# Patient Record
Sex: Female | Born: 1935 | Race: Black or African American | Hispanic: No | Smoking: Never smoker
Health system: Southern US, Community
[De-identification: ages and names within clinical notes are randomized; demographics above are authoritative.]

## PROBLEM LIST (undated history)

## (undated) DIAGNOSIS — G459 Transient cerebral ischemic attack, unspecified: Secondary | ICD-10-CM

## (undated) DIAGNOSIS — I82409 Acute embolism and thrombosis of unspecified deep veins of unspecified lower extremity: Secondary | ICD-10-CM

## (undated) DIAGNOSIS — M1990 Unspecified osteoarthritis, unspecified site: Secondary | ICD-10-CM

## (undated) DIAGNOSIS — I10 Essential (primary) hypertension: Secondary | ICD-10-CM

## (undated) DIAGNOSIS — N289 Disorder of kidney and ureter, unspecified: Secondary | ICD-10-CM

## (undated) DIAGNOSIS — I729 Aneurysm of unspecified site: Secondary | ICD-10-CM

## (undated) DIAGNOSIS — Z5189 Encounter for other specified aftercare: Secondary | ICD-10-CM

## (undated) HISTORY — PX: ABDOMINAL HYSTERECTOMY: SHX81

---

## 1997-09-30 ENCOUNTER — Other Ambulatory Visit: Admission: RE | Admit: 1997-09-30 | Discharge: 1997-09-30 | Payer: Self-pay | Admitting: Family Medicine

## 1998-10-09 ENCOUNTER — Emergency Department (HOSPITAL_COMMUNITY): Admission: EM | Admit: 1998-10-09 | Discharge: 1998-10-09 | Payer: Self-pay | Admitting: Emergency Medicine

## 1998-10-10 ENCOUNTER — Encounter: Payer: Self-pay | Admitting: Emergency Medicine

## 1998-12-24 ENCOUNTER — Other Ambulatory Visit: Admission: RE | Admit: 1998-12-24 | Discharge: 1998-12-24 | Payer: Self-pay | Admitting: Family Medicine

## 2000-12-01 ENCOUNTER — Encounter: Payer: Self-pay | Admitting: Family Medicine

## 2000-12-01 ENCOUNTER — Ambulatory Visit (HOSPITAL_COMMUNITY): Admission: RE | Admit: 2000-12-01 | Discharge: 2000-12-01 | Payer: Self-pay | Admitting: Family Medicine

## 2001-01-29 ENCOUNTER — Encounter: Payer: Self-pay | Admitting: Emergency Medicine

## 2001-01-29 ENCOUNTER — Emergency Department (HOSPITAL_COMMUNITY): Admission: EM | Admit: 2001-01-29 | Discharge: 2001-01-30 | Payer: Self-pay | Admitting: Emergency Medicine

## 2001-02-06 ENCOUNTER — Encounter: Payer: Self-pay | Admitting: Neurosurgery

## 2001-02-06 ENCOUNTER — Encounter: Admission: RE | Admit: 2001-02-06 | Discharge: 2001-02-06 | Payer: Self-pay | Admitting: Neurosurgery

## 2002-02-12 ENCOUNTER — Other Ambulatory Visit: Admission: RE | Admit: 2002-02-12 | Discharge: 2002-02-12 | Payer: Self-pay | Admitting: Family Medicine

## 2008-03-31 ENCOUNTER — Ambulatory Visit: Payer: Self-pay | Admitting: Surgery

## 2008-04-04 ENCOUNTER — Ambulatory Visit (HOSPITAL_COMMUNITY): Admission: RE | Admit: 2008-04-04 | Discharge: 2008-04-04 | Payer: Self-pay | Admitting: Surgery

## 2008-04-04 ENCOUNTER — Ambulatory Visit: Payer: Self-pay | Admitting: Surgery

## 2008-06-02 ENCOUNTER — Ambulatory Visit: Payer: Self-pay | Admitting: Surgery

## 2008-07-14 ENCOUNTER — Ambulatory Visit: Payer: Self-pay | Admitting: Surgery

## 2009-04-24 ENCOUNTER — Inpatient Hospital Stay (HOSPITAL_COMMUNITY): Admission: EM | Admit: 2009-04-24 | Discharge: 2009-04-29 | Payer: Self-pay | Admitting: Neurology

## 2009-04-27 ENCOUNTER — Ambulatory Visit: Payer: Self-pay | Admitting: Surgery

## 2009-04-27 ENCOUNTER — Encounter (INDEPENDENT_AMBULATORY_CARE_PROVIDER_SITE_OTHER): Payer: Self-pay | Admitting: Interventional Radiology

## 2009-07-13 ENCOUNTER — Ambulatory Visit: Payer: Self-pay | Admitting: Surgery

## 2009-10-17 ENCOUNTER — Inpatient Hospital Stay (HOSPITAL_COMMUNITY): Admission: EM | Admit: 2009-10-17 | Discharge: 2009-10-22 | Payer: Self-pay | Admitting: Emergency Medicine

## 2009-10-19 ENCOUNTER — Ambulatory Visit: Payer: Self-pay | Admitting: Internal Medicine

## 2009-10-21 ENCOUNTER — Encounter: Payer: Self-pay | Admitting: Internal Medicine

## 2009-10-21 ENCOUNTER — Encounter (INDEPENDENT_AMBULATORY_CARE_PROVIDER_SITE_OTHER): Payer: Self-pay | Admitting: Internal Medicine

## 2009-10-25 ENCOUNTER — Emergency Department (HOSPITAL_COMMUNITY): Admission: EM | Admit: 2009-10-25 | Discharge: 2009-10-25 | Payer: Self-pay | Admitting: Emergency Medicine

## 2009-11-10 ENCOUNTER — Ambulatory Visit: Payer: Self-pay | Admitting: Internal Medicine

## 2009-11-10 ENCOUNTER — Inpatient Hospital Stay (HOSPITAL_COMMUNITY): Admission: AD | Admit: 2009-11-10 | Discharge: 2009-11-16 | Payer: Self-pay | Admitting: Internal Medicine

## 2009-11-12 ENCOUNTER — Encounter: Payer: Self-pay | Admitting: Internal Medicine

## 2009-11-15 ENCOUNTER — Encounter: Payer: Self-pay | Admitting: Internal Medicine

## 2009-11-17 ENCOUNTER — Inpatient Hospital Stay (HOSPITAL_COMMUNITY): Admission: EM | Admit: 2009-11-17 | Discharge: 2009-11-20 | Payer: Self-pay | Admitting: Internal Medicine

## 2009-11-18 ENCOUNTER — Ambulatory Visit: Payer: Self-pay | Admitting: Vascular Surgery

## 2009-11-18 ENCOUNTER — Encounter (INDEPENDENT_AMBULATORY_CARE_PROVIDER_SITE_OTHER): Payer: Self-pay | Admitting: Family Medicine

## 2009-11-19 ENCOUNTER — Telehealth: Payer: Self-pay | Admitting: Internal Medicine

## 2009-11-23 ENCOUNTER — Encounter: Payer: Self-pay | Admitting: Internal Medicine

## 2009-11-23 DIAGNOSIS — M62838 Other muscle spasm: Secondary | ICD-10-CM | POA: Insufficient documentation

## 2010-01-12 ENCOUNTER — Telehealth: Payer: Self-pay | Admitting: Internal Medicine

## 2010-07-22 NOTE — Discharge Summary (Signed)
Summary: Hospital Discharge Update    Hospital Discharge Update:  Date of Admission: 11/10/2009 Date of Discharge: 11/12/2009  Brief Summary:  CP, HTN urgency, Abdominal pain, ESRD, refer to discharge summary for details.  Other follow-up issues:  HTN  Medication list changes:  Added new medication of CALCIUM ACETATE 667 MG CAPS (CALCIUM ACETATE (PHOS BINDER)) Take 1 tablet by mouth once a day - Signed Added new medication of AMLODIPINE BESYLATE 10 MG TABS (AMLODIPINE BESYLATE) Take 1 tablet by mouth once a day - Signed Added new medication of ASPIR-LOW 81 MG TBEC (ASPIRIN) Take 1 tablet by mouth once a day - Signed Added new medication of CARVEDILOL 6.25 MG TABS (CARVEDILOL) Take 1 tablet by mouth two times a day - Signed Added new medication of CILOSTAZOL 100 MG TABS (CILOSTAZOL) Take 1 tablet by mouth two times a day - Signed Added new medication of CLONIDINE HCL 0.3 MG TABS (CLONIDINE HCL) Take 1 tablet by mouth two times a day - Signed Added new medication of EPOGEN 10000 UNIT/ML SOLN (EPOETIN ALFA) - Signed Added new medication of LISINOPRIL 40 MG TABS (LISINOPRIL) Take 1 tablet by mouth once a day - Signed Added new medication of PROTONIX 40 MG PACK (PANTOPRAZOLE SODIUM) Take 1 tablet by mouth once a day - Signed Added new medication of RENAL MULTIVITAMIN FORMULA  TABS (B COMPLEX-C-FOLIC ACID) Take 1 tablet by mouth once a day - Signed Added new medication of SIMVASTATIN 40 MG TABS (SIMVASTATIN) Take 1 tablet by mouth once a day - Signed Changed medication from CARVEDILOL 6.25 MG TABS (CARVEDILOL) Take 1 tablet by mouth two times a day to COREG 25 MG TABS (CARVEDILOL) Take 1 tablet by mouth two times a day - Signed Rx of COREG 25 MG TABS (CARVEDILOL) Take 1 tablet by mouth two times a day;  #60 x 3;  Signed;  Entered by: Deatra Robinson MD;  Authorized by: Deatra Robinson MD;  Method used: Print then Give to Patient  The medication, problem, and allergy lists have been updated.   Please see the dictated discharge summary for details.  Discharge medications:  CALCIUM ACETATE 667 MG CAPS (CALCIUM ACETATE (PHOS BINDER)) Take 1 tablet by mouth once a day AMLODIPINE BESYLATE 10 MG TABS (AMLODIPINE BESYLATE) Take 1 tablet by mouth once a day ASPIR-LOW 81 MG TBEC (ASPIRIN) Take 1 tablet by mouth once a day COREG 25 MG TABS (CARVEDILOL) Take 1 tablet by mouth two times a day CILOSTAZOL 100 MG TABS (CILOSTAZOL) Take 1 tablet by mouth two times a day CLONIDINE HCL 0.3 MG TABS (CLONIDINE HCL) Take 1 tablet by mouth two times a day EPOGEN 10000 UNIT/ML SOLN (EPOETIN ALFA)  LISINOPRIL 40 MG TABS (LISINOPRIL) Take 1 tablet by mouth once a day PROTONIX 40 MG PACK (PANTOPRAZOLE SODIUM) Take 1 tablet by mouth once a day RENAL MULTIVITAMIN FORMULA  TABS (B COMPLEX-C-FOLIC ACID) Take 1 tablet by mouth once a day SIMVASTATIN 40 MG TABS (SIMVASTATIN) Take 1 tablet by mouth once a day  Other patient instructions:  Please come for an appointment at the outpatient clinic at [facility] on 6/7 at 10:40 am with Dr.Pokharel  for a followup visit.  Please take your medication as prescribed below.  If you have any problem, Please call the clinic.   In case of an emergency  dial 911 or go to the emergency department.    Note: Hospital Discharge Medications & Other Instructions handout was printed, one copy for patient and a second copy  to be placed in hospital chart.

## 2010-07-22 NOTE — Procedures (Signed)
Summary: Upper Endoscopy  Patient: [name] Note: All result statuses are Final unless otherwise noted.  Tests: (1) Upper Endoscopy (EGD)   EGD Upper Endoscopy       DONE     [facility]     [address]     [place], [place]  [address]           ENDOSCOPY PROCEDURE REPORT           PATIENT:  Judy Wolf, Judy Wolf  MR#:  [id number]     BIRTHDATE:  March 25, 1929, 80 yrs. old  GENDER:  female           ENDOSCOPIST:  Wilhemina Bonito. Eda Keys, MD     Referred by:  [facility],           PROCEDURE DATE:  10/21/2009     PROCEDURE:  EGD, diagnostic     ASA CLASS:  Class III     INDICATIONS:  melena (WAS HAVING EPISTAXIS ON COUMADIN)     NOTE: EMESIS LAST PM WITH PREP           MEDICATIONS:   There was residual sedation effect present from     prior procedure., Versed 1 mg IV     TOPICAL ANESTHETIC:  Cetacaine Spray           DESCRIPTION OF PROCEDURE:   After the risks benefits and     alternatives of the procedure were thoroughly explained, informed     consent was obtained.  The EG-[id number] ([id number]) endoscope was     introduced through the mouth and advanced to the second portion of     the duodenum, without limitations.  The instrument was slowly     withdrawn as the mucosa was fully examined.     <<PROCEDUREIMAGES>>           FRESH NONBLEEDING Mallory-Weiss tear in the distal esophagus.     Atrophic gastric mucosa.  Otherwise the examination was normal to     D3.    Retroflexed views revealed a hiatal hernia.    The scope     was then withdrawn from the patient and the procedure completed.           COMPLICATIONS:  None           ENDOSCOPIC IMPRESSION:     1) Nonbleeding Mallory-Weiss tear in the distal esophagus - from     emesis last pm     2) Atrophic gastric mucosa     3) Otherwise normal examination     4) A small hiatal hernia     5) ADMISSION MELENA LIKELY DUE TO EPISTAXIS           RECOMMENDATIONS:     1) Avoid COUMADIN for two weeks     2) PPI qam X 4  WEEKS     3) GI FOLLOW UP PRN     4) OK FOR HOME TOMORRROW IF STABLE           ______________________________     Wilhemina Bonito. Eda Keys, MD           CC:  [facility]           n.     eSIGNED:   Wilhemina Bonito. Eda Keys at 10/21/2009 12:22 PM           Mellody Life, [id number]  Note: An exclamation mark (!) indicates a result that was not dispersed into the flowsheet.  Document Creation Date: 10/21/2009 12:23 PM _______________________________________________________________________  (1) Order result status: Final Collection or observation date-time: 10/21/2009 12:13 Requested date-time:  Receipt date-time:  Reported date-time:  Referring Physician:   Ordering Physician: Fransico Setters [phone]) Specimen Source:  Source: Launa Grill Order Number: [id number] Lab site:

## 2010-07-22 NOTE — Progress Notes (Signed)
Summary: denial- Qualaquin/gp  Phone Note Other Incoming   Summary of Call: Received fax from [facility] , Janith Lima 324mg  has been denied. I will inform Dr.Magick. Initial call taken by: Chinita Pester RN,  January 12, 2010 4:19 PM  Follow-up for Phone Call        thank you Nitin Mckowen  I have called pt to find out if she still needs that medicine but have not heard from her. Thanks Delonda Coley Follow-up by: Mliss Sax MD,  January 12, 2010 4:27 PM

## 2010-07-22 NOTE — Progress Notes (Signed)
Summary: prior authorization form/gp  Phone Note From Pharmacy   Summary of Call: Prior Authorization form needs to be completed  and signed for Qualaquin 324 mg cap.  I will put the form in your box. Thanks Initial call taken by: Chinita Pester RN,  November 19, 2009 2:37 PM  Follow-up for Phone Call        i will do this today AM thanks  Denean Pavon Follow-up by: Mliss Sax MD,  November 23, 2009 7:12 AM     Appended Document: prior authorization form/gp Re-faxed  form to [facility] for PA approval  for Qualaquin; waiting on a response.  Appended Document: prior authorization form/gp Janith Lima has been denied; do you want to change medication? I will put denial letter in your in-box.

## 2010-07-22 NOTE — Letter (Signed)
Summary: HUMANA  HUMANA   Imported By: Margie Billet 11/25/2009 11:45:44  _____________________________________________________________________  External Attachment:    Type:   Image     Comment:   External Document

## 2010-07-22 NOTE — Progress Notes (Signed)
Summary: prior authorization/gp  Phone Note From Pharmacy   Caller: [facility] Summary of Call: Prior Authorization is needed for Qualaquin 324mg  cap - take 1 cap by mouth twice a day as needed for cramping. I do not see this med. on her current med. list. Is pt. on this medication also do you want to change to something else; if so, PA needs to be done. Just let me know; thanks. Initial call taken by: Chinita Pester RN,  November 19, 2009 12:21 PM  Follow-up for Phone Call        i just put in the med on the list. That would be fine to continue I just did not know the pharmacy that she is going to. Let me know if I need to do anything else thanks iskra Follow-up by: Mliss Sax MD,  November 19, 2009 1:39 PM    New/Updated Medications: QUALAQUIN 324 MG CAPS (QUININE SULFATE) take 1 tablet twice daily Prescriptions: QUALAQUIN 324 MG CAPS (QUININE SULFATE) take 1 tablet twice daily  #60 x 3   Entered and Authorized by:   Mliss Sax MD   Signed by:   Mliss Sax MD on 11/19/2009   Method used:   Historical   RxID:   [id number]

## 2010-07-22 NOTE — Procedures (Signed)
Summary: Colonoscopy  Patient: Judy Wolf Note: All result statuses are Final unless otherwise noted.  Tests: (1) Colonoscopy (COL)   COL Colonoscopy           DONE     [facility]     [address]     [place], [place]  [address]           COLONOSCOPY PROCEDURE REPORT           PATIENT:  Judy Wolf, Judy Wolf  MR#:  [id number]     BIRTHDATE:  April 01, 1929, 80 yrs. old  GENDER:  female     ENDOSCOPIST:  Wilhemina Bonito. Eda Keys, MD     REF. BY:  [facility],     PROCEDURE DATE:  10/21/2009     PROCEDURE:  Colonoscopy with snare polypectomy X 4     ASA CLASS:  Class III     INDICATIONS:  rectal bleeding, abnormal CT of abdomen, abdominal     pain     MEDICATIONS:   Fentanyl 100 mcg IV, Versed 6 mg IV           DESCRIPTION OF PROCEDURE:   After the risks benefits and     alternatives of the procedure were thoroughly explained, informed     consent was obtained.  Digital rectal exam was performed and     revealed no abnormalities.   The EC-[id number] ([id number]) endoscope was     introduced through the anus and advanced to the cecum, which was     identified by both the appendix and ileocecal valve, without     limitations.  The quality of the prep was good, using MiraLax.     The instrument was then slowly withdrawn as the colon was fully     examined.     <<PROCEDUREIMAGES>>           FINDINGS:  There were multiple polyps identified and removed.     AND CECAL AS WELL AS ASCENDING REMOVED W/ COLD SNARE AND     RETRIEVED. PEDUNCULATED SIGMOID COLON POLYP RFEMOVED IN 2     PIECES W/ SNARE CAUTERY. ONE PIECE RETRIEVED Moderate     diverticulosis was found in the sigmoid colon WITH SIGNIFICANT     STENOSIS.  This was otherwise a normal examination of the colon.     Retroflexed views in the rectum revealed no abnormalities.    The     scope was then withdrawn from the patient and the procedure     completed.           COMPLICATIONS:  None     ENDOSCOPIC  IMPRESSION:     1) Polyps, multiple (4) -REMOVED     2) Moderate diverticulosis in the sigmoid colon     3) Otherwise normal examination           RECOMMENDATIONS:     1) Return to the care of your primary provider. GI follow up as     needed (AGE / COMORBIDITIES)     2) EGD TODAY           ______________________________     Wilhemina Bonito. Eda Keys, MD           CC:  [facility]           n.     eSIGNED:   Wilhemina Bonito. Eda Keys at 10/21/2009 12:03 PM  Giada, [place], [phone]  Note: An exclamation mark (!) indicates a result that was not dispersed into the flowsheet. Document Creation Date: 10/21/2009 12:04 PM _______________________________________________________________________  (1) Order result status: Final Collection or observation date-time: 10/21/2009 11:57 Requested date-time:  Receipt date-time:  Reported date-time:  Referring Physician:   Ordering Physician: Fransico Setters [phone]) Specimen Source:  Source: Launa Grill Order Number: [id number] Lab site:

## 2010-09-06 LAB — HEPARIN LEVEL (UNFRACTIONATED)
Heparin Unfractionated: 0.4 IU/mL (ref 0.30–0.70)
Heparin Unfractionated: 0.45 IU/mL (ref 0.30–0.70)
Heparin Unfractionated: 0.64 IU/mL (ref 0.30–0.70)
Heparin Unfractionated: <0.1 IU/mL — ABNORMAL LOW (ref 0.30–0.70)
Heparin Unfractionated: <0.1 IU/mL — ABNORMAL LOW (ref 0.30–0.70)
Heparin Unfractionated: <0.1 IU/mL — ABNORMAL LOW (ref 0.30–0.70)

## 2010-09-06 LAB — HEPATITIS B SURFACE ANTIGEN: Hepatitis B Surface Ag: NEGATIVE

## 2010-09-06 LAB — CBC
HCT: 31.1 % — ABNORMAL LOW (ref 36.0–46.0)
HCT: 35.3 % — ABNORMAL LOW (ref 36.0–46.0)
HCT: 36.1 % (ref 36.0–46.0)
Hemoglobin: 10.8 g/dL — ABNORMAL LOW (ref 12.0–15.0)
Hemoglobin: 10.4 g/dL — ABNORMAL LOW (ref 12.0–15.0)
Hemoglobin: 11.5 g/dL — ABNORMAL LOW (ref 12.0–15.0)
Hemoglobin: 11.8 g/dL — ABNORMAL LOW (ref 12.0–15.0)
MCHC: 33.2 g/dL (ref 30.0–36.0)
MCHC: 33.4 g/dL (ref 30.0–36.0)
MCHC: 33.4 g/dL (ref 30.0–36.0)
MCHC: 33.4 g/dL (ref 30.0–36.0)
MCV: 98.6 fL (ref 78.0–100.0)
MCV: 98.2 fL (ref 78.0–100.0)
MCV: 98.8 fL (ref 78.0–100.0)
MCV: 99.1 fL (ref 78.0–100.0)
MCV: 99.3 fL (ref 78.0–100.0)
MCV: 99.3 fL (ref 78.0–100.0)
Platelets: 208 10*3/uL (ref 150–400)
Platelets: 242 10*3/uL (ref 150–400)
Platelets: 280 10*3/uL (ref 150–400)
Platelets: 334 10*3/uL (ref 150–400)
Platelets: 337 10*3/uL (ref 150–400)
Platelets: 349 10*3/uL (ref 150–400)
Platelets: 367 10*3/uL (ref 150–400)
Platelets: 385 10*3/uL (ref 150–400)
Platelets: 387 10*3/uL (ref 150–400)
RBC: 3.14 MIL/uL — ABNORMAL LOW (ref 3.87–5.11)
RBC: 3.93 MIL/uL (ref 3.87–5.11)
RDW: 16.6 % — ABNORMAL HIGH (ref 11.5–15.5)
RDW: 15.8 % — ABNORMAL HIGH (ref 11.5–15.5)
RDW: 16 % — ABNORMAL HIGH (ref 11.5–15.5)
RDW: 16.4 % — ABNORMAL HIGH (ref 11.5–15.5)
RDW: 16.4 % — ABNORMAL HIGH (ref 11.5–15.5)
RDW: 17 % — ABNORMAL HIGH (ref 11.5–15.5)
WBC: 5.2 10*3/uL (ref 4.0–10.5)
WBC: 6 10*3/uL (ref 4.0–10.5)
WBC: 7.2 10*3/uL (ref 4.0–10.5)
WBC: 7.6 10*3/uL (ref 4.0–10.5)
WBC: 8.8 10*3/uL (ref 4.0–10.5)

## 2010-09-06 LAB — RENAL FUNCTION PANEL
Albumin: 3 g/dL — ABNORMAL LOW (ref 3.5–5.2)
Albumin: 3.2 g/dL — ABNORMAL LOW (ref 3.5–5.2)
Albumin: 3.4 g/dL — ABNORMAL LOW (ref 3.5–5.2)
Albumin: 3.8 g/dL (ref 3.5–5.2)
BUN: 20 mg/dL (ref 6–23)
BUN: 26 mg/dL — ABNORMAL HIGH (ref 6–23)
BUN: 50 mg/dL — ABNORMAL HIGH (ref 6–23)
CO2: 30 mEq/L (ref 19–32)
CO2: 27 mEq/L (ref 19–32)
Calcium: 8.3 mg/dL — ABNORMAL LOW (ref 8.4–10.5)
Calcium: 9 mg/dL (ref 8.4–10.5)
Calcium: 9 mg/dL (ref 8.4–10.5)
Calcium: 9.4 mg/dL (ref 8.4–10.5)
Calcium: 9.8 mg/dL (ref 8.4–10.5)
Chloride: 97 mEq/L (ref 96–112)
Creatinine, Ser: 5.14 mg/dL — ABNORMAL HIGH (ref 0.4–1.2)
Creatinine, Ser: 6.71 mg/dL — ABNORMAL HIGH (ref 0.4–1.2)
Creatinine, Ser: 7.4 mg/dL — ABNORMAL HIGH (ref 0.4–1.2)
GFR calc Af Amer: 11 mL/min — ABNORMAL LOW (ref >60)
GFR calc Af Amer: 10 mL/min — ABNORMAL LOW (ref >60)
GFR calc Af Amer: 6 mL/min — ABNORMAL LOW (ref >60)
GFR calc Af Amer: 7 mL/min — ABNORMAL LOW (ref >60)
GFR calc Af Amer: 8 mL/min — ABNORMAL LOW (ref >60)
GFR calc non Af Amer: 9 mL/min — ABNORMAL LOW (ref >60)
GFR calc non Af Amer: 5 mL/min — ABNORMAL LOW (ref >60)
GFR calc non Af Amer: 7 mL/min — ABNORMAL LOW (ref >60)
GFR calc non Af Amer: 8 mL/min — ABNORMAL LOW (ref >60)
Glucose, Bld: 116 mg/dL — ABNORMAL HIGH (ref 70–99)
Phosphorus: 3.4 mg/dL (ref 2.3–4.6)
Phosphorus: 3 mg/dL (ref 2.3–4.6)
Phosphorus: 3.8 mg/dL (ref 2.3–4.6)
Phosphorus: 4.3 mg/dL (ref 2.3–4.6)
Phosphorus: 4.7 mg/dL — ABNORMAL HIGH (ref 2.3–4.6)
Phosphorus: 6 mg/dL — ABNORMAL HIGH (ref 2.3–4.6)
Sodium: 137 mEq/L (ref 135–145)
Sodium: 137 mEq/L (ref 135–145)
Sodium: 141 mEq/L (ref 135–145)

## 2010-09-06 LAB — CARDIAC PANEL(CRET KIN+CKTOT+MB+TROPI)
CK, MB: 1.5 ng/mL (ref 0.3–4.0)
CK, MB: 2.1 ng/mL (ref 0.3–4.0)
Relative Index: INVALID (ref 0.0–2.5)
Relative Index: INVALID (ref 0.0–2.5)
Relative Index: INVALID (ref 0.0–2.5)
Relative Index: INVALID (ref 0.0–2.5)
Total CK: 68 U/L (ref 7–177)
Total CK: 47 U/L (ref 7–177)
Total CK: 62 U/L (ref 7–177)
Troponin I: 0.04 ng/mL (ref 0.00–0.06)
Troponin I: 0.01 ng/mL (ref 0.00–0.06)
Troponin I: 0.04 ng/mL (ref 0.00–0.06)

## 2010-09-06 LAB — PROTIME-INR
INR: 1.26 (ref 0.00–1.49)
Prothrombin Time: 13.9 seconds (ref 11.6–15.2)
Prothrombin Time: 15.7 seconds — ABNORMAL HIGH (ref 11.6–15.2)

## 2010-09-06 LAB — BRAIN NATRIURETIC PEPTIDE: Pro B Natriuretic peptide (BNP): 296 pg/mL — ABNORMAL HIGH (ref 0.0–100.0)

## 2010-09-06 LAB — COMPREHENSIVE METABOLIC PANEL
ALT: 18 U/L (ref 0–35)
AST: 24 U/L (ref 0–37)
Albumin: 3.8 g/dL (ref 3.5–5.2)
BUN: 72 mg/dL — ABNORMAL HIGH (ref 6–23)
CO2: 21 mEq/L (ref 19–32)
Calcium: 8.3 mg/dL — ABNORMAL LOW (ref 8.4–10.5)
Chloride: 96 mEq/L (ref 96–112)
Creatinine, Ser: 10.1 mg/dL — ABNORMAL HIGH (ref 0.4–1.2)
Creatinine, Ser: 10.12 mg/dL — ABNORMAL HIGH (ref 0.4–1.2)
GFR calc Af Amer: 4 mL/min — ABNORMAL LOW (ref >60)
GFR calc non Af Amer: 4 mL/min — ABNORMAL LOW (ref >60)
Glucose, Bld: 113 mg/dL — ABNORMAL HIGH (ref 70–99)
Glucose, Bld: 166 mg/dL — ABNORMAL HIGH (ref 70–99)
Sodium: 132 mEq/L — ABNORMAL LOW (ref 135–145)
Total Bilirubin: 0.2 mg/dL — ABNORMAL LOW (ref 0.3–1.2)
Total Protein: 6.2 g/dL (ref 6.0–8.3)

## 2010-09-06 LAB — BASIC METABOLIC PANEL
CO2: 28 mEq/L (ref 19–32)
Potassium: 3.6 mEq/L (ref 3.5–5.1)
Sodium: 134 mEq/L — ABNORMAL LOW (ref 135–145)

## 2010-09-06 LAB — LIPID PANEL
Cholesterol: 148 mg/dL (ref 0–200)
LDL Cholesterol: 52 mg/dL (ref 0–99)
Total CHOL/HDL Ratio: 1.9 RATIO
Triglycerides: 65 mg/dL (ref <150)
Triglycerides: 39 mg/dL (ref <150)
VLDL: 8 mg/dL (ref 0–40)

## 2010-09-06 LAB — AMYLASE: Amylase: 115 U/L — ABNORMAL HIGH (ref 0–105)

## 2010-09-06 LAB — HEPATIC FUNCTION PANEL: Total Protein: 6.8 g/dL (ref 6.0–8.3)

## 2010-09-06 LAB — PHOSPHORUS: Phosphorus: 3.6 mg/dL (ref 2.3–4.6)

## 2010-09-07 LAB — POCT I-STAT, CHEM 8
BUN: 16 mg/dL (ref 6–23)
Calcium, Ion: 1.09 mmol/L — ABNORMAL LOW (ref 1.12–1.32)
Chloride: 94 meq/L — ABNORMAL LOW (ref 96–112)
Creatinine, Ser: 2.4 mg/dL — ABNORMAL HIGH (ref 0.4–1.2)
Glucose, Bld: 221 mg/dL — ABNORMAL HIGH (ref 70–99)
HCT: 32 % — ABNORMAL LOW (ref 36.0–46.0)
Hemoglobin: 10.9 g/dL — ABNORMAL LOW (ref 12.0–15.0)
Potassium: 3.1 meq/L — ABNORMAL LOW (ref 3.5–5.1)
Sodium: 138 meq/L (ref 135–145)
TCO2: 33 mmol/L (ref 0–100)

## 2010-09-07 LAB — COMPREHENSIVE METABOLIC PANEL
ALT: 20 U/L (ref 0–35)
AST: 24 U/L (ref 0–37)
AST: 24 U/L (ref 0–37)
Albumin: 3.3 g/dL — ABNORMAL LOW (ref 3.5–5.2)
Albumin: 3.3 g/dL — ABNORMAL LOW (ref 3.5–5.2)
Albumin: 3.6 g/dL (ref 3.5–5.2)
Alkaline Phosphatase: 48 U/L (ref 39–117)
BUN: 14 mg/dL (ref 6–23)
CO2: 35 mEq/L — ABNORMAL HIGH (ref 19–32)
Calcium: 8.7 mg/dL (ref 8.4–10.5)
Calcium: 8.4 mg/dL (ref 8.4–10.5)
Chloride: 95 mEq/L — ABNORMAL LOW (ref 96–112)
Chloride: 95 mEq/L — ABNORMAL LOW (ref 96–112)
Creatinine, Ser: 3.81 mg/dL — ABNORMAL HIGH (ref 0.4–1.2)
Creatinine, Ser: 6.16 mg/dL — ABNORMAL HIGH (ref 0.4–1.2)
Creatinine, Ser: 2.43 mg/dL — ABNORMAL HIGH (ref 0.4–1.2)
GFR calc Af Amer: 14 mL/min — ABNORMAL LOW (ref >60)
GFR calc Af Amer: 8 mL/min — ABNORMAL LOW (ref >60)
GFR calc non Af Amer: 11 mL/min — ABNORMAL LOW (ref >60)
GFR calc non Af Amer: 7 mL/min — ABNORMAL LOW (ref >60)
GFR calc non Af Amer: 19 mL/min — ABNORMAL LOW (ref >60)
Potassium: 3.6 mEq/L (ref 3.5–5.1)
Sodium: 139 mEq/L (ref 135–145)
Total Bilirubin: 0.9 mg/dL (ref 0.3–1.2)
Total Bilirubin: 0.5 mg/dL (ref 0.3–1.2)
Total Protein: 5.5 g/dL — ABNORMAL LOW (ref 6.0–8.3)

## 2010-09-07 LAB — POCT CARDIAC MARKERS
CKMB, poc: <1 ng/mL — ABNORMAL LOW (ref 1.0–8.0)
Myoglobin, poc: 487 ng/mL (ref 12–200)
Troponin i, poc: <0.05 ng/mL (ref 0.00–0.09)

## 2010-09-07 LAB — RENAL FUNCTION PANEL
Albumin: 3.2 g/dL — ABNORMAL LOW (ref 3.5–5.2)
BUN: 43 mg/dL — ABNORMAL HIGH (ref 6–23)
CO2: 25 mEq/L (ref 19–32)
CO2: 28 mEq/L (ref 19–32)
CO2: 32 mEq/L (ref 19–32)
Calcium: 9.3 mg/dL (ref 8.4–10.5)
Chloride: 94 mEq/L — ABNORMAL LOW (ref 96–112)
Chloride: 96 mEq/L (ref 96–112)
Creatinine, Ser: 5.03 mg/dL — ABNORMAL HIGH (ref 0.4–1.2)
Creatinine, Ser: 6.63 mg/dL — ABNORMAL HIGH (ref 0.4–1.2)
GFR calc Af Amer: 10 mL/min — ABNORMAL LOW (ref >60)
GFR calc Af Amer: 8 mL/min — ABNORMAL LOW (ref >60)
GFR calc non Af Amer: 6 mL/min — ABNORMAL LOW (ref >60)
GFR calc non Af Amer: 8 mL/min — ABNORMAL LOW (ref >60)
Glucose, Bld: 116 mg/dL — ABNORMAL HIGH (ref 70–99)
Glucose, Bld: 120 mg/dL — ABNORMAL HIGH (ref 70–99)
Glucose, Bld: 125 mg/dL — ABNORMAL HIGH (ref 70–99)
Phosphorus: 3.9 mg/dL (ref 2.3–4.6)
Potassium: 3.1 mEq/L — ABNORMAL LOW (ref 3.5–5.1)
Sodium: 133 mEq/L — ABNORMAL LOW (ref 135–145)

## 2010-09-07 LAB — CBC
HCT: 25.9 % — ABNORMAL LOW (ref 36.0–46.0)
HCT: 27.5 % — ABNORMAL LOW (ref 36.0–46.0)
HCT: 29.4 % — ABNORMAL LOW (ref 36.0–46.0)
Hemoglobin: 10.8 g/dL — ABNORMAL LOW (ref 12.0–15.0)
Hemoglobin: 8.8 g/dL — ABNORMAL LOW (ref 12.0–15.0)
MCHC: 33.4 g/dL (ref 30.0–36.0)
MCHC: 34.7 g/dL (ref 30.0–36.0)
MCHC: 34.1 g/dL (ref 30.0–36.0)
MCV: 94.7 fL (ref 78.0–100.0)
MCV: 95.2 fL (ref 78.0–100.0)
MCV: 95.6 fL (ref 78.0–100.0)
MCV: 95.9 fL (ref 78.0–100.0)
Platelets: 203 10*3/uL (ref 150–400)
Platelets: 215 10*3/uL (ref 150–400)
Platelets: 216 10*3/uL (ref 150–400)
Platelets: 208 10*3/uL (ref 150–400)
RBC: 2.71 MIL/uL — ABNORMAL LOW (ref 3.87–5.11)
RBC: 2.86 MIL/uL — ABNORMAL LOW (ref 3.87–5.11)
RBC: 3.4 MIL/uL — ABNORMAL LOW (ref 3.87–5.11)
RDW: 12.4 % (ref 11.5–15.5)
WBC: 10.4 10*3/uL (ref 4.0–10.5)
WBC: 5.9 10*3/uL (ref 4.0–10.5)
WBC: 6.4 10*3/uL (ref 4.0–10.5)
WBC: 6 10*3/uL (ref 4.0–10.5)

## 2010-09-07 LAB — DIFFERENTIAL
Basophils Absolute: 0 10*3/uL (ref 0.0–0.1)
Basophils Absolute: 0.1 10*3/uL (ref 0.0–0.1)
Eosinophils Absolute: 0.2 10*3/uL (ref 0.0–0.7)
Eosinophils Relative: 4 % (ref 0–5)
Lymphocytes Relative: 29 % (ref 12–46)
Lymphocytes Relative: 17 % (ref 12–46)
Monocytes Absolute: 0.6 10*3/uL (ref 0.1–1.0)
Neutro Abs: 4.4 10*3/uL (ref 1.7–7.7)

## 2010-09-07 LAB — PREPARE FRESH FROZEN PLASMA

## 2010-09-07 LAB — CARDIAC PANEL(CRET KIN+CKTOT+MB+TROPI)
CK, MB: 2.3 ng/mL (ref 0.3–4.0)
Relative Index: 1.8 (ref 0.0–2.5)
Relative Index: 1.9 (ref 0.0–2.5)
Relative Index: 1.8 (ref 0.0–2.5)
Total CK: 125 U/L (ref 7–177)
Total CK: 121 U/L (ref 7–177)
Troponin I: 0.03 ng/mL (ref 0.00–0.06)
Troponin I: 0.03 ng/mL (ref 0.00–0.06)
Troponin I: 0.03 ng/mL (ref 0.00–0.06)

## 2010-09-07 LAB — TYPE AND SCREEN: ABO/RH(D): B POS

## 2010-09-07 LAB — HEMOGLOBIN A1C: Hgb A1c MFr Bld: 6.6 % — ABNORMAL HIGH (ref <5.7)

## 2010-09-07 LAB — GLUCOSE, CAPILLARY: Glucose-Capillary: 167 mg/dL — ABNORMAL HIGH (ref 70–99)

## 2010-09-07 LAB — PROTIME-INR: Prothrombin Time: 38.1 seconds — ABNORMAL HIGH (ref 11.6–15.2)

## 2010-09-07 LAB — ABO/RH: ABO/RH(D): B POS

## 2010-09-07 LAB — APTT: aPTT: 46 seconds — ABNORMAL HIGH (ref 24–37)

## 2010-09-07 LAB — HEMOGLOBIN AND HEMATOCRIT, BLOOD
HCT: 27.6 % — ABNORMAL LOW (ref 36.0–46.0)
Hemoglobin: 9.4 g/dL — ABNORMAL LOW (ref 12.0–15.0)

## 2010-09-22 LAB — GLUCOSE, CAPILLARY
Glucose-Capillary: 100 mg/dL — ABNORMAL HIGH (ref 70–99)
Glucose-Capillary: 103 mg/dL — ABNORMAL HIGH (ref 70–99)
Glucose-Capillary: 106 mg/dL — ABNORMAL HIGH (ref 70–99)
Glucose-Capillary: 107 mg/dL — ABNORMAL HIGH (ref 70–99)
Glucose-Capillary: 120 mg/dL — ABNORMAL HIGH (ref 70–99)
Glucose-Capillary: 125 mg/dL — ABNORMAL HIGH (ref 70–99)
Glucose-Capillary: 131 mg/dL — ABNORMAL HIGH (ref 70–99)
Glucose-Capillary: 142 mg/dL — ABNORMAL HIGH (ref 70–99)
Glucose-Capillary: 173 mg/dL — ABNORMAL HIGH (ref 70–99)

## 2010-09-22 LAB — CBC
HCT: 39 % (ref 36.0–46.0)
HCT: 39.8 % (ref 36.0–46.0)
HCT: 41.3 % (ref 36.0–46.0)
HCT: 42.3 % (ref 36.0–46.0)
Hemoglobin: 13.1 g/dL (ref 12.0–15.0)
Hemoglobin: 13.3 g/dL (ref 12.0–15.0)
Hemoglobin: 13.6 g/dL (ref 12.0–15.0)
Hemoglobin: 14 g/dL (ref 12.0–15.0)
MCHC: 33.3 g/dL (ref 30.0–36.0)
MCHC: 33.4 g/dL (ref 30.0–36.0)
MCV: 92.7 fL (ref 78.0–100.0)
MCV: 93.8 fL (ref 78.0–100.0)
Platelets: 200 10*3/uL (ref 150–400)
Platelets: 227 10*3/uL (ref 150–400)
RBC: 4.52 MIL/uL (ref 3.87–5.11)
RDW: 14.7 % (ref 11.5–15.5)
RDW: 14.8 % (ref 11.5–15.5)
RDW: 14.9 % (ref 11.5–15.5)
RDW: 15 % (ref 11.5–15.5)
RDW: 15.1 % (ref 11.5–15.5)

## 2010-09-22 LAB — PROTIME-INR
INR: 1.29 (ref 0.00–1.49)
INR: 1.33 (ref 0.00–1.49)
INR: 1.53 — ABNORMAL HIGH (ref 0.00–1.49)
INR: 1.54 — ABNORMAL HIGH (ref 0.00–1.49)
INR: 1.82 — ABNORMAL HIGH (ref 0.00–1.49)
Prothrombin Time: 16 seconds — ABNORMAL HIGH (ref 11.6–15.2)

## 2010-09-22 LAB — RENAL FUNCTION PANEL
BUN: 39 mg/dL — ABNORMAL HIGH (ref 6–23)
CO2: 25 mEq/L (ref 19–32)
Calcium: 9.4 mg/dL (ref 8.4–10.5)
Creatinine, Ser: 5.85 mg/dL — ABNORMAL HIGH (ref 0.4–1.2)
Glucose, Bld: 103 mg/dL — ABNORMAL HIGH (ref 70–99)
Phosphorus: 2.4 mg/dL (ref 2.3–4.6)
Sodium: 136 mEq/L (ref 135–145)

## 2010-09-22 LAB — BASIC METABOLIC PANEL
BUN: 29 mg/dL — ABNORMAL HIGH (ref 6–23)
CO2: 30 mEq/L (ref 19–32)
Chloride: 93 mEq/L — ABNORMAL LOW (ref 96–112)
Creatinine, Ser: 4.76 mg/dL — ABNORMAL HIGH (ref 0.4–1.2)
GFR calc Af Amer: 11 mL/min — ABNORMAL LOW (ref >60)

## 2010-09-22 LAB — LIPID PANEL
Cholesterol: 155 mg/dL (ref 0–200)
HDL: 91 mg/dL (ref >39)
LDL Cholesterol: 51 mg/dL (ref 0–99)
Total CHOL/HDL Ratio: 1.7 RATIO
Triglycerides: 67 mg/dL (ref <150)

## 2010-09-22 LAB — HEPARIN LEVEL (UNFRACTIONATED): Heparin Unfractionated: 0.57 IU/mL (ref 0.30–0.70)

## 2010-09-22 LAB — HEMOGLOBIN A1C: Mean Plasma Glucose: 137 mg/dL

## 2010-11-02 NOTE — Assessment & Plan Note (Signed)
OFFICE VISIT   BROOKLEN, RUNQUIST  DOB:  05/15/1929                                       03/31/2008  EAVWU#:[id number]   REASON FOR VISIT:  Dialysis access.   HISTORY:  This is a 77 year old female that I am seeing at the request  of Dr. Caryn Section for evaluation of permanent dialysis access.  The patient has  a history of longstanding and poorly controlled hypertension as well as  hypercholesterolemia.  She is getting to the point of requiring  dialysis.  She is not yet on dialysis but comes in for permanent access.  She is right-handed.   PAST MEDICAL HISTORY:  Hypertension, proteinuria, chronic kidney  disease, hyperlipidemia and tobacco abuse.   REVIEW OF SYSTEMS:  GENERAL:  Positive for weight loss and loss of  appetite.  CARDIAC:  Negative.  PULMONARY:  Negative.  GI:  Positive for constipation.  GU:  Positive for kidney disease.  VASCULAR:  Pain in legs when walking and lying flat.  NEURO:  Is positive for dizziness and headaches.  ORTHO:  Positive for arthritis and joint pains.  PSYCHIATRIC:  Negative.  ENT:  Is recent change in eyesight and hearing.  HEME:  Is negative.   FAMILY HISTORY:  Noncontributory.   SOCIAL HISTORY:  She is widowed.  Does not smoke, quit approximately 1  month ago.  Does not drink alcohol.   MEDICATIONS:  1. Include clonidine 0.1 mg.  2. Doxazosin.  3. Simvastatin.  4. Nisoldipine.  5. Potassium.  6. Lasix.  7. Cardura.  8. Norvasc.  9. Labetalol.   ALLERGIES:  None.   PHYSICAL EXAMINATION:  Vital signs:  Blood pressure is 210/75, pulse 71.  General:  She is well-appearing and in no distress.  Cardiovascular:  Regular rate and rhythm.  Pulmonary:  Lungs are clear bilaterally.  Extremities:  She has palpable left radial pulse, visible cephalic vein.   DIAGNOSTIC STUDIES:  Upper extremity vein mapping was performed.  This  reveals cephalic vein measuring approximately 0.3 throughout the entire  length with the  exception of the antecubital crease where it goes down  to 0.2.   ASSESSMENT:  Chronic kidney disease needing permanent access.   PLAN:  The patient will be scheduled for a left wrist fistula to be  performed Friday, October 16.  I discussed that if the vein was not  adequate at her wrist that we would move to her antecubital crease.  I  quoted there was an approximately 85% chance of maturation.  We  discussed the risk of steal and infection.  She understands all these  and elected to proceed.  Again, her operation is scheduled for Friday,  October 16, left wrist fistula.   Jorge Ny, MD  Electronically Signed   VWB/MEDQ  D:  03/31/2008  T:  04/01/2008  Job:  [id number]

## 2010-11-02 NOTE — Assessment & Plan Note (Signed)
OFFICE VISIT   Judy, Wolf  DOB:  [birth year]                                       07/14/2008  VFIEP#:[id number]   HISTORY:  This is a 77 year old female not yet on dialysis who underwent  left wrist Camino fistula on April 04, 2008.  During her postoperative  course she had mild complaints of pain in her left middle finger as well  as her left thumb.  She began using exercise ball and she states today  that the pain has resolved.  She has no complaints with her arm at this  time.  She denies any swelling.   PHYSICAL EXAMINATION:  VITAL SIGNS:  Blood pressure 180/65, pulse 66,  respirations 18.  GENERAL:  She is well-appearing, no distress.  CARDIOVASCULAR:  Regular rate and rhythm.  The fistula is widely patent.  There is an excellent thrill.   ASSESSMENT/PLAN:  Status post Cimino fistula.   PLAN:  I think this will serve as a nice conduit for her to receive  dialysis should the need occur.  I will follow up with her on a p.r.n.  basis.   Jorge Ny, MD  Electronically Signed   VWB/MEDQ  D:  07/14/2008  T:  07/15/2008  Job:  [id number]   cc:   [facility]

## 2010-11-02 NOTE — Assessment & Plan Note (Signed)
OFFICE VISIT   NANAMI, WHITELAW  DOB:  Feb 06, 1929                                       06/02/2008  OZHYQ#:[id number]   REASON FOR VISIT:  Followup.   HISTORY:  This is a 77 year old female not yet on dialysis who underwent  a left wrist Cimino fistula performed on 04/04/2008.  She comes in today  for followup.  She is complaining of some pain in her left middle finger  as well as her left thumb.  It began happening approximately 2 weeks  ago.  She has an exercise ball, but has not been routinely using it.   EXAMINATION:  Her blood pressure is 220/71, pulse 77, respirations 18.  The left cephalic vein is readily palpable with a palpable thrill in the  forearm.   PLAN:  I am going to have the patient come back to see me in 1 month's  time to further evaluation the fistula.  I do think it will provide an  excellent access site if she ever requires dialysis.  With regard to the  pain in her hand, I am giving her a new exercise ball and encouraging  her to use it.  It is unclear to me at this time whether or not her pain  is due to steal, since it is limited to 2 fingers.  In any event, I  think if it is a steal syndrome, it is very mild at best and is  something that should get better with exercise.  Again, I am going to  see her back in 1 month.   Jorge Ny, MD  Electronically Signed   VWB/MEDQ  D:  06/02/2008  T:  06/03/2008  Job:  [id number]   cc:   [facility]

## 2010-11-02 NOTE — Procedures (Signed)
CEPHALIC VEIN MAPPING   INDICATION:  Preop exam for AV fistula placement.   HISTORY:  Chronic kidney disease.   EXAM:  The right cephalic vein  was not evaluated.   The left cephalic vein is compressible.   Diameter measurements range from 0.2 to 0.35 cm.   IMPRESSION:  Patent left cephalic vein with diameter measurements as  described above.   ___________________________________________  V. Charlena Cross, MD   CH/MEDQ  D:  03/31/2008  T:  03/31/2008  Job:  [id number]

## 2010-11-02 NOTE — Op Note (Signed)
NAMEDEANA, Judy Wolf                  ACCOUNT NO.:  [id number]   MEDICAL RECORD NO.:  [id number]          PATIENT TYPE:  AMB   LOCATION:  SDS                          FACILITY:  [facility]   PHYSICIAN:  VDurene Cal IV, MDDATE OF BIRTH:  Jul 09, 1928   DATE OF PROCEDURE:  04/04/2008  DATE OF DISCHARGE:                               OPERATIVE REPORT   PREOPERATIVE DIAGNOSIS:  Chronic kidney disease.   POSTOPERATIVE DIAGNOSIS:  Chronic kidney disease.   SURGEON:  1. Durene Cal IV, MD   PROCEDURE PERFORMED:  Left wrist fistula.   ANESTHESIA:  MAC.   BLOOD LOSS:  Minimal.   FINDINGS:  4-mm vein.   PROCEDURE:  The patient was identified in the holding and taken to room  8 and she was placed supine on the table.  The left wrist was prepped  and draped in standard sterile fashion.  Time-out was called.  Antibiotics were given.  A transverse incision was made two  fingerbreadths below the radial head.  The cephalic vein was identified  within the lateral part of the incision that was free from the  surrounding tissue and encircled with a vessel loop.  The vein was then  dissected further proximal.  It was marked with an ink pen to maintain  proper orientation.  Next, the radial artery was identified and  mobilized proximally and distally.  The patient was given systemic  heparinization.  The radial artery was occluded with vascular clamps.  Prior to this, the vein had been transected, the end was ligated with a  3-0 silk tie.  The vein flushed easily with heparinized saline.  It  accepted a 4-mm coronary dilator.  Vein was then cut to the appropriate  length.  It was spatulated and end-to-side anastomosis was created using  a running 6-0 Prolene to the radial artery.  Prior to completion of the  anastomosis, the artery was flushed in antegrade and retrograde fashion.  An anastomosis was then secured, proximal clamp was released followed by  the distal clamp.  There was an excellent  thrill within the vein to the  level of the elbow.  I was very happy with the position of vein and its  size.  The wound was irrigated.  Tissue was closed with 3-0 and 4-0  Vicryl.  Dermabond was placed.  The patient was taken to the recovery  room in stable condition.  There were no complications.           ______________________________  V. Charlena Cross, MD  Electronically Signed     VWB/MEDQ  D:  04/04/2008  T:  04/04/2008  Job:  [id number]

## 2010-11-02 NOTE — Assessment & Plan Note (Signed)
OFFICE VISIT   TRINIDAD, INGLE  DOB:  [birth year]                                       07/13/2009  EAVWU#:[id number]   REASON FOR VISIT:  Right leg pain.   HISTORY:  The patient is a 77 year old female previously seen in our  office for dialysis access.  The patient currently dialyzes through a  left radiocephalic fistula.  She comes in today for evaluation of a new  problem which is pain in her right leg.  She states that she has had  problems with this and her right leg worse than her left for  approximately 1 year, but it has been getting worse.  She describes this  as if something is crawling on her leg.  She states that this mostly  occurs at night.  It does not occur with exercise.  She does complain of  some toe pain in her right great toe at the end of the day.  She has a  history of restless leg syndrome as well as a history of tingling in her  toes.   The patient again is end-stage renal disease.  She dialyzes Tuesday,  Thursday, Saturday through a left radiocephalic fistula.  Her renal  disease is secondary to hypertension which is medically treated.  She  also suffers from hypercholesterolemia.   REVIEW OF SYSTEMS:  Negative as documented in the encounter form except  for what is detailed in the HPI.   PAST MEDICAL HISTORY:  Hypertension, hypercholesterolemia, and end-stage  renal disease.   FAMILY HISTORY:  Negative for cardiovascular disease at an early age.   SOCIAL HISTORY:  She is widowed with 5 children, smokes a half-pack a  day.   ALLERGIES:  None.   Vital signs:  Heart 61, blood pressure 195/71, temperature is 98.0.  General:  She is well appearing, in no distress.  HEENT is within normal  limits.  Lungs are clear bilaterally.  No rales, rhonchi, or wheezing.  Cardiovascular:  Regular rhythm.  No murmur.  She does not have palpable  pedal pulses.  She has palpable femoral pulses.  Abdomen:  Soft,  nontender.  No pulsatile mass.   No hepatosplenomegaly.  Musculoskeletal:  Without major deformities or cyanosis.  Neurologic:  She has no focal  deficits.  Skin is without rash.   DIAGNOSTIC STUDIES:  Ankle brachial indices were performed today which  been independently reviewed.  This reveals an ABI of 0.98 on the left  with a toe pressure of 136 on the right.  Her ABI is 0.53 with a toe  pressure of 50.  She has occlusive digital waveforms.   PLAN:  At this time, I think based on the patient's symptoms that her  difficulties are multifactorial.  At this point, I am going to elect to  treat her medically which will begin with a trial of cilostazol to see  if this has some improvement in her symptoms.  I am going to give her 3  months to see this will improve her quality of life.  If it does not, I  will need to consider whether or not to proceed with arteriogram.  At  this point, I am unsure if revascularization will help her symptoms.  We  will revisit this in 3 months when she comes back.     Juleen China  IV, MD  Electronically Signed   VWB/MEDQ  D:  07/13/2009  T:  07/14/2009  Job:  [id number]   cc:   Garnetta Buddy, M.D.

## 2011-03-21 LAB — POCT I-STAT 4, (NA,K, GLUC, HGB,HCT)
Hemoglobin: 11.6 — ABNORMAL LOW
Sodium: 135

## 2011-07-29 ENCOUNTER — Inpatient Hospital Stay (HOSPITAL_COMMUNITY)
Admission: EM | Admit: 2011-07-29 | Discharge: 2011-08-05 | DRG: 417 | Disposition: A | Discharge status: Home Health | Payer: Medicare Other | Attending: Internal Medicine | Admitting: Internal Medicine

## 2011-07-29 ENCOUNTER — Emergency Department (HOSPITAL_COMMUNITY): Payer: Medicare Other

## 2011-07-29 ENCOUNTER — Other Ambulatory Visit: Payer: Self-pay

## 2011-07-29 ENCOUNTER — Encounter (HOSPITAL_COMMUNITY): Payer: Self-pay | Admitting: Emergency Medicine

## 2011-07-29 DIAGNOSIS — N2581 Secondary hyperparathyroidism of renal origin: Secondary | ICD-10-CM | POA: Diagnosis present

## 2011-07-29 DIAGNOSIS — R109 Unspecified abdominal pain: Secondary | ICD-10-CM | POA: Diagnosis present

## 2011-07-29 DIAGNOSIS — K8010 Calculus of gallbladder with chronic cholecystitis without obstruction: Principal | ICD-10-CM | POA: Diagnosis present

## 2011-07-29 DIAGNOSIS — D649 Anemia, unspecified: Secondary | ICD-10-CM | POA: Diagnosis not present

## 2011-07-29 DIAGNOSIS — Z7901 Long term (current) use of anticoagulants: Secondary | ICD-10-CM

## 2011-07-29 DIAGNOSIS — B370 Candidal stomatitis: Secondary | ICD-10-CM | POA: Diagnosis not present

## 2011-07-29 DIAGNOSIS — I120 Hypertensive chronic kidney disease with stage 5 chronic kidney disease or end stage renal disease: Secondary | ICD-10-CM | POA: Diagnosis present

## 2011-07-29 DIAGNOSIS — Z79899 Other long term (current) drug therapy: Secondary | ICD-10-CM

## 2011-07-29 DIAGNOSIS — N186 End stage renal disease: Secondary | ICD-10-CM | POA: Diagnosis present

## 2011-07-29 DIAGNOSIS — Z8673 Personal history of transient ischemic attack (TIA), and cerebral infarction without residual deficits: Secondary | ICD-10-CM

## 2011-07-29 DIAGNOSIS — I739 Peripheral vascular disease, unspecified: Secondary | ICD-10-CM | POA: Diagnosis present

## 2011-07-29 DIAGNOSIS — K8020 Calculus of gallbladder without cholecystitis without obstruction: Secondary | ICD-10-CM

## 2011-07-29 DIAGNOSIS — I10 Essential (primary) hypertension: Secondary | ICD-10-CM | POA: Diagnosis present

## 2011-07-29 DIAGNOSIS — R112 Nausea with vomiting, unspecified: Secondary | ICD-10-CM | POA: Diagnosis present

## 2011-07-29 DIAGNOSIS — E119 Type 2 diabetes mellitus without complications: Secondary | ICD-10-CM | POA: Diagnosis present

## 2011-07-29 DIAGNOSIS — Z86711 Personal history of pulmonary embolism: Secondary | ICD-10-CM

## 2011-07-29 DIAGNOSIS — Z992 Dependence on renal dialysis: Secondary | ICD-10-CM | POA: Diagnosis present

## 2011-07-29 DIAGNOSIS — G459 Transient cerebral ischemic attack, unspecified: Secondary | ICD-10-CM | POA: Diagnosis present

## 2011-07-29 DIAGNOSIS — M62838 Other muscle spasm: Secondary | ICD-10-CM

## 2011-07-29 DIAGNOSIS — R1011 Right upper quadrant pain: Secondary | ICD-10-CM

## 2011-07-29 HISTORY — DX: Transient cerebral ischemic attack, unspecified: G45.9

## 2011-07-29 HISTORY — DX: Disorder of kidney and ureter, unspecified: N28.9

## 2011-07-29 HISTORY — DX: Essential (primary) hypertension: I10

## 2011-07-29 LAB — URINALYSIS, ROUTINE W REFLEX MICROSCOPIC
Glucose, UA: NEGATIVE mg/dL
Ketones, ur: NEGATIVE mg/dL
Leukocytes, UA: NEGATIVE
Protein, ur: 100 mg/dL — AB
Urobilinogen, UA: 1 mg/dL (ref 0.0–1.0)

## 2011-07-29 LAB — LIPASE, BLOOD: Lipase: 21 U/L (ref 11–59)

## 2011-07-29 LAB — COMPREHENSIVE METABOLIC PANEL
BUN: 19 mg/dL (ref 6–23)
CO2: 34 mEq/L — ABNORMAL HIGH (ref 19–32)
Chloride: 94 mEq/L — ABNORMAL LOW (ref 96–112)
Creatinine, Ser: 3.87 mg/dL — ABNORMAL HIGH (ref 0.50–1.10)
GFR calc Af Amer: 12 mL/min — ABNORMAL LOW (ref >90)
GFR calc non Af Amer: 10 mL/min — ABNORMAL LOW (ref >90)
Glucose, Bld: 116 mg/dL — ABNORMAL HIGH (ref 70–99)
Total Bilirubin: 0.4 mg/dL (ref 0.3–1.2)

## 2011-07-29 LAB — DIFFERENTIAL
Eosinophils Relative: 0 % (ref 0–5)
Lymphocytes Relative: 9 % — ABNORMAL LOW (ref 12–46)
Monocytes Absolute: 0.6 10*3/uL (ref 0.1–1.0)
Monocytes Relative: 8 % (ref 3–12)
Neutro Abs: 5.6 10*3/uL (ref 1.7–7.7)

## 2011-07-29 LAB — CBC
HCT: 31.5 % — ABNORMAL LOW (ref 36.0–46.0)
Hemoglobin: 10.4 g/dL — ABNORMAL LOW (ref 12.0–15.0)
MCV: 91.8 fL (ref 78.0–100.0)
WBC: 6.8 10*3/uL (ref 4.0–10.5)

## 2011-07-29 MED ORDER — HYDROMORPHONE HCL PF 1 MG/ML IJ SOLN
0.5000 mg | INTRAMUSCULAR | Status: DC | PRN
  Administered 2011-07-29 – 2011-08-03 (×6): 1 mg via INTRAVENOUS
  Filled 2011-07-29 (×5): qty 1

## 2011-07-29 MED ORDER — CLONIDINE HCL 0.1 MG PO TABS
0.1000 mg | ORAL_TABLET | Freq: Three times a day (TID) | ORAL | Status: DC
  Administered 2011-07-29 – 2011-08-05 (×18): 0.1 mg via ORAL
  Filled 2011-07-29 (×24): qty 1

## 2011-07-29 MED ORDER — AMLODIPINE BESYLATE 10 MG PO TABS
10.0000 mg | ORAL_TABLET | Freq: Two times a day (BID) | ORAL | Status: DC
  Administered 2011-07-29 – 2011-08-05 (×14): 10 mg via ORAL
  Filled 2011-07-29 (×15): qty 1

## 2011-07-29 MED ORDER — WHITE PETROLATUM GEL
Status: AC
  Administered 2011-07-29: 21:00:00
  Filled 2011-07-29: qty 5

## 2011-07-29 MED ORDER — ONDANSETRON HCL 4 MG/2ML IJ SOLN
4.0000 mg | Freq: Once | INTRAMUSCULAR | Status: AC
  Administered 2011-07-29: 4 mg via INTRAVENOUS
  Filled 2011-07-29: qty 2

## 2011-07-29 MED ORDER — HYDROMORPHONE HCL PF 1 MG/ML IJ SOLN: 0.5000 mg | INTRAMUSCULAR | Status: DC | PRN

## 2011-07-29 MED ORDER — SODIUM CHLORIDE 0.9 % IV BOLUS (SEPSIS)
500.0000 mL | Freq: Once | INTRAVENOUS | Status: AC
  Administered 2011-07-29: 500 mL via INTRAVENOUS

## 2011-07-29 MED ORDER — ONDANSETRON HCL 4 MG PO TABS: 4.0000 mg | ORAL_TABLET | Freq: Four times a day (QID) | ORAL | Status: DC | PRN

## 2011-07-29 MED ORDER — ONDANSETRON HCL 4 MG/2ML IJ SOLN: 4.0000 mg | Freq: Three times a day (TID) | INTRAMUSCULAR | Status: DC | PRN

## 2011-07-29 MED ORDER — ACETAMINOPHEN 650 MG RE SUPP: 650.0000 mg | Freq: Four times a day (QID) | RECTAL | Status: DC | PRN

## 2011-07-29 MED ORDER — HYDROCODONE-ACETAMINOPHEN 5-500 MG PO TABS: 1.0000 | ORAL_TABLET | Freq: Four times a day (QID) | ORAL | Status: DC | PRN

## 2011-07-29 MED ORDER — FENTANYL CITRATE 0.05 MG/ML IJ SOLN
50.0000 ug | Freq: Once | INTRAMUSCULAR | Status: AC
  Administered 2011-07-29: 50 ug via INTRAVENOUS
  Filled 2011-07-29: qty 2

## 2011-07-29 MED ORDER — ONDANSETRON HCL 4 MG/2ML IJ SOLN
4.0000 mg | Freq: Four times a day (QID) | INTRAMUSCULAR | Status: DC | PRN
  Administered 2011-07-29 – 2011-08-01 (×3): 4 mg via INTRAVENOUS
  Filled 2011-07-29 (×3): qty 2

## 2011-07-29 MED ORDER — SODIUM CHLORIDE 0.9 % IV SOLN
INTRAVENOUS | Status: DC
  Administered 2011-07-29: 21:00:00 via INTRAVENOUS

## 2011-07-29 MED ORDER — SODIUM CHLORIDE 0.9 % IJ SOLN
3.0000 mL | Freq: Two times a day (BID) | INTRAMUSCULAR | Status: DC
  Administered 2011-07-30 – 2011-08-05 (×10): 3 mL via INTRAVENOUS

## 2011-07-29 MED ORDER — ACETAMINOPHEN 650 MG RE SUPP
650.0000 mg | Freq: Once | RECTAL | Status: AC
  Administered 2011-07-29: 650 mg via RECTAL
  Filled 2011-07-29: qty 1

## 2011-07-29 MED ORDER — ACETAMINOPHEN 325 MG PO TABS
650.0000 mg | ORAL_TABLET | Freq: Four times a day (QID) | ORAL | Status: DC | PRN
  Administered 2011-08-01 – 2011-08-04 (×3): 650 mg via ORAL
  Filled 2011-07-29 (×2): qty 2

## 2011-07-29 MED ORDER — PANTOPRAZOLE SODIUM 40 MG PO TBEC
40.0000 mg | DELAYED_RELEASE_TABLET | Freq: Every day | ORAL | Status: DC
  Administered 2011-07-30 – 2011-08-05 (×6): 40 mg via ORAL
  Filled 2011-07-29 (×7): qty 1

## 2011-07-29 MED ORDER — PIPERACILLIN-TAZOBACTAM IN DEX 2-0.25 GM/50ML IV SOLN
2.2500 g | Freq: Three times a day (TID) | INTRAVENOUS | Status: DC
  Administered 2011-07-29 – 2011-08-04 (×18): 2.25 g via INTRAVENOUS
  Filled 2011-07-29 (×22): qty 50

## 2011-07-29 MED ORDER — HYDROCODONE-ACETAMINOPHEN 5-325 MG PO TABS
1.0000 | ORAL_TABLET | Freq: Four times a day (QID) | ORAL | Status: DC | PRN
  Administered 2011-07-30 – 2011-08-04 (×3): 1 via ORAL
  Filled 2011-07-29 (×4): qty 1

## 2011-07-29 NOTE — ED Provider Notes (Addendum)
6:22 PM Pt with several day history of RUQ pain, with gallstones on ultrasound.   She has endstage renal failure, on dialysis MWF.  Exam showed initial temp of 101, RUQ tenderness.  Lab tests today showed normal WBC, Normal LFT's, and abdominal ultrasound positive for small gallstones.  Call to Dr. Jimmye Norman, on call for general surgery, who will consult on pt.  He requests that we call internal medicine to admit her, for further workup, including HIDA scan.  7:27 PM Case discussed with Dr. Isidoro Donning.  Admit to telemetry to [id number], [facility], Dr. Elisabeth Pigeon.   Carleene Cooper III, MD 07/29/11 1928  Carleene Cooper III, MD 07/29/11 [phone]

## 2011-07-29 NOTE — Consult Note (Signed)
Reason for Consult:I was asked to see this patient because of right upper quadrant epigastric pain. She had a prior CT scan of the abdomen and pelvis which showed gallstones and a thickened gallbladder wall. Apparently at the same time she also had abnormal liver function tests. However because the patient wanted her care performed [facility] she was brought here by her family for further evaluation and treatment. Referring Physician: Dr. Jimmey Ralph is an 78 y.o. female.  HPI: The patient was seen at [facility] yesterday with abdominal pain, abnormal liver function tests, and a CT scan demonstrating a thickened gallbladder wall gallstones. She was not evaluated by a surgeon and subsequently discharged home with oral pain medication.  The patient continued to have fevers and chills nausea vomiting and abdominal pain. She is brought to [facility] for further evaluation.  Upon evaluating the patient she had acute right upper quadrant and epigastric abdominal pain. She had a fever of over 101. However her ultrasound demonstrates gallstones with no evidence of gallbladder wall thickening or pericholecystic fluid. Her liver function tests were reported to be normal. A white blood cell count is normal also.  The patient is in renal failure because of hypertension. In order to maintain her grafts patency she is on Coumadin.  Past Medical History  Diagnosis Date  . Hypertension   . Renal disorder     History reviewed. No pertinent past surgical history.  History reviewed. No pertinent family history.  Social History:  reports that she has never smoked. She does not have any smokeless tobacco history on file. She reports that she does not drink alcohol or use illicit drugs.  Allergies: No Known Allergies  Medications: I have reviewed the patient's current medications.  Results for orders placed during the hospital encounter of 07/29/11 (from the past 48  hour(s))  CBC     Status: Abnormal   Collection Time   07/29/11  3:31 PM      Component Value Range Comment   WBC 6.8  4.0 - 10.5 (K/uL)    RBC 3.43 (*) 3.87 - 5.11 (MIL/uL)    Hemoglobin 10.4 (*) 12.0 - 15.0 (g/dL)    HCT 91.4 (*) 78.2 - 46.0 (%)    MCV 91.8  78.0 - 100.0 (fL)    MCH 30.3  26.0 - 34.0 (pg)    MCHC 33.0  30.0 - 36.0 (g/dL)    RDW 95.6  21.3 - 08.6 (%)    Platelets 282  150 - 400 (K/uL)   DIFFERENTIAL     Status: Abnormal   Collection Time   07/29/11  3:31 PM      Component Value Range Comment   Neutrophils Relative 83 (*) 43 - 77 (%)    Neutro Abs 5.6  1.7 - 7.7 (K/uL)    Lymphocytes Relative 9 (*) 12 - 46 (%)    Lymphs Abs 0.6 (*) 0.7 - 4.0 (K/uL)    Monocytes Relative 8  3 - 12 (%)    Monocytes Absolute 0.6  0.1 - 1.0 (K/uL)    Eosinophils Relative 0  0 - 5 (%)    Eosinophils Absolute 0.0  0.0 - 0.7 (K/uL)    Basophils Relative 0  0 - 1 (%)    Basophils Absolute 0.0  0.0 - 0.1 (K/uL)   COMPREHENSIVE METABOLIC PANEL     Status: Abnormal   Collection Time   07/29/11  3:31 PM      Component  Value Range Comment   Sodium 136  135 - 145 (mEq/L)    Potassium 3.6  3.5 - 5.1 (mEq/L)    Chloride 94 (*) 96 - 112 (mEq/L)    CO2 34 (*) 19 - 32 (mEq/L)    Glucose, Bld 116 (*) 70 - 99 (mg/dL)    BUN 19  6 - 23 (mg/dL)    Creatinine, Ser 6.44 (*) 0.50 - 1.10 (mg/dL)    Calcium 9.9  8.4 - 10.5 (mg/dL)    Total Protein 6.9  6.0 - 8.3 (g/dL)    Albumin 3.5  3.5 - 5.2 (g/dL)    AST 32  0 - 37 (U/L)    ALT 41 (*) 0 - 35 (U/L)    Alkaline Phosphatase 53  39 - 117 (U/L)    Total Bilirubin 0.4  0.3 - 1.2 (mg/dL)    GFR calc non Af Amer 10 (*) >90 (mL/min)    GFR calc Af Amer 12 (*) >90 (mL/min)   LIPASE, BLOOD     Status: Normal   Collection Time   07/29/11  3:31 PM      Component Value Range Comment   Lipase 21  11 - 59 (U/L)   PROTIME-INR     Status: Abnormal   Collection Time   07/29/11  3:31 PM      Component Value Range Comment   Prothrombin Time 35.6 (*) 11.6 - 15.2  (seconds)    INR 3.49 (*) 0.00 - 1.49    URINALYSIS, ROUTINE W REFLEX MICROSCOPIC     Status: Abnormal   Collection Time   07/29/11  4:21 PM      Component Value Range Comment   Color, Urine YELLOW  YELLOW     APPearance CLEAR  CLEAR     Specific Gravity, Urine 1.014  1.005 - 1.030     pH 8.0  5.0 - 8.0     Glucose, UA NEGATIVE  NEGATIVE (mg/dL)    Hgb urine dipstick NEGATIVE  NEGATIVE     Bilirubin Urine NEGATIVE  NEGATIVE     Ketones, ur NEGATIVE  NEGATIVE (mg/dL)    Protein, ur 034 (*) NEGATIVE (mg/dL)    Urobilinogen, UA 1.0  0.0 - 1.0 (mg/dL)    Nitrite NEGATIVE  NEGATIVE     Leukocytes, UA NEGATIVE  NEGATIVE    URINE MICROSCOPIC-ADD ON     Status: Normal   Collection Time   07/29/11  4:21 PM      Component Value Range Comment   WBC, UA 0-2  <3 (WBC/hpf)     US Abdomen Complete  07/29/2011  *RADIOLOGY REPORT*  Clinical Data:  Right upper quadrant abdominal pain.  ABDOMINAL ULTRASOUND COMPLETE  Comparison:  CT on 11/14/2009  Findings:  Gallbladder:  A few tiny less than 5 mm mobile gallstones are seen as well as a small amount of layering echogenic sludge.  There is no evidence of gallbladder wall thickening or pericholecystic fluid.  Common Bile Duct:  Within normal limits in caliber. Measures 5 mm in diameter.  Liver: No focal mass lesion identified.  Within normal limits in parenchymal echogenicity.  IVC:  Appears normal.  Pancreas:  No abnormality identified.  Spleen:  Within normal limits in size and echotexture.  Right kidney:  A small size measuring 8.1 cm length.  Diffusely increased renal parenchymal echogenicity, consistent with medical renal disease.  Small cysts seen measuring up to 1 cm in size.  No evidence of renal mass or hydronephrosis.  Left kidney:  A small size, measuring 8.9 cm length.  Diffusely increased renal parenchymal echogenicity, consistent with medical renal disease.  Several renal cysts, largest measuring 2 cm.  No evidence of renal mass or hydronephrosis.   Abdominal Aorta:  No aneurysm identified.  IMPRESSION:  1.  Several tiny gallstones and gallbladder sludge.  No evidence of gallbladder wall thickening or biliary dilatation. 2.  Small kidneys with increased parenchymal echogenicity, consistent with chronic medical renal disease.  No evidence of hydronephrosis.  Original Report Authenticated By: Danae Orleans, M.D.    Review of Systems  Constitutional: Positive for fever, chills and malaise/fatigue.  HENT: Negative.   Eyes: Negative.   Respiratory: Negative.   Cardiovascular: Negative.   Gastrointestinal: Positive for nausea, vomiting and abdominal pain (epigastric and RUQ).  Genitourinary: Negative.   Musculoskeletal: Negative.   Skin: Negative.   Neurological: Negative.   Endo/Heme/Allergies: Negative.    Blood pressure 162/45, pulse 72, temperature 99.5 F (37.5 C), temperature source Oral, resp. rate 17, height 5\' 2"  (1.575 m), weight 58.968 kg (130 lb), SpO2 93.00%. Physical Exam  Constitutional: She is oriented to person, place, and time. She appears well-developed and well-nourished.  HENT:  Head: Normocephalic and atraumatic.  Mouth/Throat: Oropharynx is clear and moist.  Eyes: Conjunctivae and EOM are normal. Pupils are equal, round, and reactive to light.  Neck: Normal range of motion. Neck supple.  Cardiovascular: Normal rate and regular rhythm.   Respiratory: Effort normal and breath sounds normal.  GI: Soft. Normal appearance and bowel sounds are normal. She exhibits abdominal bruit. She exhibits no shifting dullness, no pulsatile liver, no ascites and no mass. There is tenderness (RUQ and epigastrium) in the right upper quadrant and epigastric area. There is guarding and positive Murphy's sign. There is no rigidity, no rebound and no CVA tenderness.    Musculoskeletal: Normal range of motion.       Right forearm: She exhibits swelling (Site of fistula for dialysis).  Neurological: She is alert and oriented to person,  place, and time. She has normal reflexes.  Skin: Skin is warm and dry.  Psychiatric: She has a normal mood and affect. Her behavior is normal. Judgment and thought content normal.    Assessment/Plan: Acute right upper quadrant pain associated with fever, normal white blood cell count, and known gallstones on recent ultrasound and CT scan of the abdomen and pelvis.  The patient's current INR is elevated and she has multiple medical problems. He would be more appropriate for her to be admitted to medical service with [place] consulting until her PT INR has corrected. There is no urgency in correcting her PT INR with plasma, however we will allow it to drift down over the next several days. When it is within acceptable limited likely an INR of less than 1.6-1.5 and we will go ahead and likely perform surgery.  Also because of the inconsistency of her liver function tests being normal currently and no evidence of acute cholecystitis on recent ultrasound I believe that a HIDA scan would be useful. Also all blood thinning medication should be held at this time. It would be appropriate to place the patient on IV antibiotics to treat likely enteric organisms that would most likely be affecting her gallbladder if it is acutely infected. We will continue to follow her on our service.  Rogue Pautler III,Belem Hintze O 07/29/2011, 6:41 PM

## 2011-07-29 NOTE — Progress Notes (Signed)
ANTIBIOTIC CONSULT NOTE - INITIAL  Pharmacy Consult for Zosyn Indication: r/o cholecystitis  No Known Allergies  Patient Measurements: Height: 5\' 2"  (157.5 cm) Weight: 135 lb (61.236 kg) IBW/kg (Calculated) : 50.1    Vital Signs: Temp: 99.4 F (37.4 C) (02/08 2025) Temp src: Oral (02/08 2025) BP: 176/62 mmHg (02/08 2025) Pulse Rate: 72  (02/08 2025) Intake/Output from previous day:   Intake/Output from this shift:    Labs:  Basename 07/29/11 1531  WBC 6.8  HGB 10.4*  PLT 282  LABCREA --  CREATININE 3.87*   Estimated Creatinine Clearance: 10 ml/min (by C-G formula based on Cr of 3.87). No results found for this basename: VANCOTROUGH:2,VANCOPEAK:2,VANCORANDOM:2,GENTTROUGH:2,GENTPEAK:2,GENTRANDOM:2,TOBRATROUGH:2,TOBRAPEAK:2,TOBRARND:2,AMIKACINPEAK:2,AMIKACINTROU:2,AMIKACIN:2, in the last 72 hours   Microbiology: No results found for this or any previous visit (from the past 720 hour(s)).  Medical History: Past Medical History  Diagnosis Date  . Hypertension   . Renal disorder     Medications:  Prescriptions prior to admission  Medication Sig Dispense Refill  . amLODipine (NORVASC) 10 MG tablet Take 10 mg by mouth 2 (two) times daily.      . cloNIDine (CATAPRES) 0.1 MG tablet Take 0.1 mg by mouth 3 (three) times daily.      Marland Kitchen HYDROcodone-acetaminophen (VICODIN) 5-500 MG per tablet Take 1 tablet by mouth daily as needed. pain      . warfarin (COUMADIN) 5 MG tablet Take 5 mg by mouth daily. 1 tablet on Tuesday, 1 and 1/2 tablet all other days.       Assessment: 78 yo lady to begin IV antibiotics for abdominal pain and fever, r/o cholecystitis.  Goal of Therapy:  Appropriate antibiotic therapy  Plan:  Zosyn 2.25 gm IV q8 hours.  F/u clinical course.  Talbert Cage Poteet 07/29/2011,9:24 PM

## 2011-07-29 NOTE — ED Notes (Signed)
Pt c/o N/V and fever while at dialysis today; pt sts did not finish whole treatment; pt sts generalized weakness

## 2011-07-29 NOTE — ED Notes (Signed)
Report has been called but admitting md is with patient at this time

## 2011-07-29 NOTE — ED Notes (Signed)
[id number] 

## 2011-07-29 NOTE — ED Notes (Signed)
TRH1 CALLED 

## 2011-07-29 NOTE — ED Notes (Signed)
Pt has left arm dialysis shunt, states during dialysis today she became nauseated and was vomiting, did not finish dialysis and sent here.

## 2011-07-29 NOTE — H&P (Signed)
History and Physical       Hospital Admission Note Date: 07/29/2011  Patient name: Judy Wolf Medical record number: [id number] Date of birth: May 31, 1929 Age: 78 y.o. Gender: female PCP: Pcp Not In System  Attending physician: Manson Passey, MD   Chief Complaint:  Intermittent nausea vomiting with abdominal pain  HPI: Patient is a 78 year old African American female with multiple medical problems including end-stage renal disease on hemodialysis (Mon, Wed, Fri), hypertension, history of cerebral aneurysm, TIA, PE, peripheral vascular disease, hypertension, diabetes mellitus, on Coumadin, presented to the [facility] with abdominal pain. Patient reported that she was at dialysis on Wednesday and had started to feel nausea today and vomited a few times. Patient went to see her primary care physician next day, Dr. Nathanial Rancher, Rosalita Levan, and had multiple lab tests done.  Patient states that she went back to the dialysis today and again started feeling nauseated and vomited. She also reported abdominal pain in her upper abdomen, felt weak and feverish. Patient apparently had a prior CT scan and [facility] which showed gallstones and thickened gallbladder wall. Patient wanted her care to be performed in [place]. In the emergency room patient was evaluated by general surgery, Dr. Lindie Spruce, who felt that patient does not have evidence of acute cholecystitis on recent ultrasound, however patient does have abdominal pain with fevers, known gallstones on recent ultrasound, recommended IV antibiotics, HIDA scan, holding the Coumadin and admission to medical service.     Review of Systems:  Constitutional: See history of present illness HEENT: Denies photophobia, eye pain, redness, hearing loss, ear pain, congestion, sore throat, rhinorrhea, sneezing, mouth sores, trouble swallowing, neck pain, neck stiffness and tinnitus.   Respiratory:  Denies SOB, DOE, cough, chest tightness,  and wheezing.   Cardiovascular: Denies chest pain, palpitations and leg swelling.  Gastrointestinal: See history of present illness  Genitourinary: Denies dysuria, urgency, frequency, hematuria, flank pain and difficulty urinating.  Musculoskeletal: Denies myalgias, back pain, joint swelling, arthralgias and gait problem.  Skin: Denies pallor, rash and wound.  Neurological: Denies dizziness, seizures, syncope, weakness, light-headedness, numbness and headaches.  Hematological: Denies adenopathy. Easy bruising, personal or family bleeding history  Psychiatric/Behavioral: Denies suicidal ideation, mood changes, confusion, nervousness, sleep disturbance and agitation  Past Medical History: Past Medical History  Diagnosis Date  . Hypertension   . Renal disorder    History reviewed. No pertinent past surgical history.  Medications: Prior to Admission medications   Medication Sig Start Date End Date Taking? Authorizing Provider  amLODipine (NORVASC) 10 MG tablet Take 10 mg by mouth 2 (two) times daily.   Yes Historical Provider, MD  cloNIDine (CATAPRES) 0.1 MG tablet Take 0.1 mg by mouth 3 (three) times daily.   Yes Historical Provider, MD  HYDROcodone-acetaminophen (VICODIN) 5-500 MG per tablet Take 1 tablet by mouth daily as needed. pain   Yes Historical Provider, MD  warfarin (COUMADIN) 5 MG tablet Take 5 mg by mouth daily. 1 tablet on Tuesday, 1 and 1/2 tablet all other days.   Yes Historical Provider, MD    Allergies:  No Known Allergies  Social History:  reports that she has never smoked. She does not have any smokeless tobacco history on file. She reports that she does not drink alcohol or use illicit drugs.  Family History: History reviewed. No pertinent family history.  Physical Exam: Blood pressure 176/62, pulse 72, temperature 99.4 F (37.4 C), temperature source Oral, resp. rate 17, height 5\' 2"  (1.575 m), weight 61.236 kg (  135  lb), SpO2 96.00%. General: Alert, awake, oriented x3, in no acute distress. HEENT: anicteric sclera, pink conjunctiva, pupils equal and reactive to light and accomodation Neck: supple, no masses or lymphadenopathy, no goiter, no bruits  Heart: Regular rate and rhythm, without murmurs, rubs or gallops. Lungs: Clear to auscultation bilaterally, no wheezing, rales or rhonchi. Abdomen: Mild epigastric and right upper quadrant tenderness to deep palpation, soft normal bowel sounds Extremities: No clubbing, cyanosis or edema with positive pedal pulses. Neuro: Grossly intact, no focal neurological deficits, strength 5/5 upper and lower extremities bilaterally Psych: alert and oriented x 3, normal mood and affect Skin: no rashes or lesions, warm and dry   LABS on Admission:  Basic Metabolic Panel:  Lab 07/29/11 5409  NA 136  K 3.6  CL 94*  CO2 34*  GLUCOSE 116*  BUN 19  CREATININE 3.87*  CALCIUM 9.9  MG --  PHOS --   Liver Function Tests:  Lab 07/29/11 1531  AST 32  ALT 41*  ALKPHOS 53  BILITOT 0.4  PROT 6.9  ALBUMIN 3.5    Lab 07/29/11 1531  LIPASE 21  AMYLASE --   CBC:  Lab 07/29/11 1531  WBC 6.8  NEUTROABS 5.6  HGB 10.4*  HCT 31.5*  MCV 91.8  PLT 282     Radiological Exams on Admission: US Abdomen Complete  07/29/2011  *RADIOLOGY REPORT*  Clinical Data:  Right upper quadrant abdominal pain.  ABDOMINAL ULTRASOUND COMPLETE  Comparison:  CT on 11/14/2009  Findings:  Gallbladder:  A few tiny less than 5 mm mobile gallstones are seen as well as a small amount of layering echogenic sludge.  There is no evidence of gallbladder wall thickening or pericholecystic fluid.  Common Bile Duct:  Within normal limits in caliber. Measures 5 mm in diameter.  Liver: No focal mass lesion identified.  Within normal limits in parenchymal echogenicity.  IVC:  Appears normal.  Pancreas:  No abnormality identified.  Spleen:  Within normal limits in size and echotexture.  Right kidney:  A  small size measuring 8.1 cm length.  Diffusely increased renal parenchymal echogenicity, consistent with medical renal disease.  Small cysts seen measuring up to 1 cm in size.  No evidence of renal mass or hydronephrosis.  Left kidney:  A small size, measuring 8.9 cm length.  Diffusely increased renal parenchymal echogenicity, consistent with medical renal disease.  Several renal cysts, largest measuring 2 cm.  No evidence of renal mass or hydronephrosis.  Abdominal Aorta:  No aneurysm identified.  IMPRESSION:  1.  Several tiny gallstones and gallbladder sludge.  No evidence of gallbladder wall thickening or biliary dilatation. 2.  Small kidneys with increased parenchymal echogenicity, consistent with chronic medical renal disease.  No evidence of hydronephrosis.  Original Report Authenticated By: Danae Orleans, M.D.    Assessment/Plan Present on Admission:  .Cholecystitis/nausea and vomiting/ abdominal pain: - Admit to medicine service, appreciate surgery recommendations, will place on n.p.o. status, IV Zosyn, pain control  - Will obtain HIDA scan in a.m. hold Coumadin and continue to monitor PT/INR  - INR currently supratherapeutic at 3.49, once <2, patient can be bridged with heparin drip while awaiting the surgery   .ESRD (end stage renal disease) on dialysis: - Patient is on hemodialysis Monday, Wednesday, Friday, states that she did have dialysis today however felt nauseous and vomited. Renal service will be consulted in a.m. to continue hemodialysis per her schedule.   Marland KitchenTIA (transient ischemic attack): Patient is on Coumadin, currently INR  supratherapeutic, bridge with heparin drip once INR drifts down to less than 2   .PVD (peripheral vascular disease): Currently stable, on Coumadin  .Diabetes mellitus: Check HbA1c, patient is not on any insulin or oral hypoglycemics  .HTN (hypertension): Continue home medications, amlodipine and clonidine  DVT prophylaxis: Currently INR  supratherapeutic, hold Coumadin, bridge with heparin when INR less than 2  CODE STATUS: Full code  Further plan will depend as patient's clinical course evolves and further radiologic and laboratory data become available.   @Time  Spent on Admission: 1 hour  Shalaya Swailes M.D. [facility] 07/29/2011, 8:38 PM

## 2011-07-29 NOTE — ED Provider Notes (Signed)
History     CSN: [id number]  Arrival date & time 07/29/11  1355   First MD Initiated Contact with Patient 07/29/11 1506      Chief Complaint  Patient presents with  . Emesis  . Fever    (Consider location/radiation/quality/duration/timing/severity/associated sxs/prior treatment) HPI  Patient presents to emergency department with complaint of a three-day history of intermittent nausea and vomiting with one loose stool today as well as fever. Patient states she is an end-stage renal patient and receives dialysis Monday Wednesdays and Fridays and still makes small amounts of urine. Patient states she was at dialysis on Wednesday and began to feel very nauseated and vomited multiple times. Patient states she went to see her primary care physician the next day on Thursday, Dr. Nathanial Rancher, in [place] and had multiple labs and tests done. Patient states she has brought an envelope with information from that visit. Patient states she then went back to dialysis today in [place] and again began to feel nauseated and began to vomit. Patient states she is hurting all over but particularly in her upper abdomen. Patient states she was told yesterday that she had a problem "somewhere in my upper stomach." Patient lives at home alone. Patient states she overall feels feverish and weak. Patient states she has history of hysterectomy but no other known abdominal surgeries. Patient denies aggravating or alleviating factors. Symptoms are acute onset, last for 30 minutes to an hour. And then mildly improve to resolve before they recur once again. She denies CP, SOB, cough, dysuria, hematuria or blood in stool  Per information from [facility], patient was diagnosed with biliary colic and abdominal pain and prescribed oxycodone-acetaminophen. Information about labs and tests or order it without results. We will contact [facility] to obtain labs and imaging from yesterday.  Past Medical History  Diagnosis  Date  . Hypertension   . Renal disorder     History reviewed. No pertinent past surgical history.  History reviewed. No pertinent family history.  History  Substance Use Topics  . Smoking status: Never Smoker   . Smokeless tobacco: Not on file  . Alcohol Use: No    OB History    Grav Para Term Preterm Abortions TAB SAB Ect Mult Living                  Review of Systems  All other systems reviewed and are negative.    Allergies  Review of patient's allergies indicates no known allergies.  Home Medications   Current Outpatient Rx  Name Route Sig Dispense Refill  . AMLODIPINE BESYLATE 10 MG PO TABS Oral Take 10 mg by mouth 2 (two) times daily.    Marland Kitchen CLONIDINE HCL 0.1 MG PO TABS Oral Take 0.1 mg by mouth 3 (three) times daily.    Marland Kitchen HYDROCODONE-ACETAMINOPHEN 5-500 MG PO TABS Oral Take 1 tablet by mouth daily as needed. pain    . WARFARIN SODIUM 5 MG PO TABS Oral Take 5 mg by mouth daily. 1 tablet on Tuesday, 1 and 1/2 tablet all other days.      BP 162/45  Pulse 72  Temp(Src) 99.5 F (37.5 C) (Oral)  Resp 17  Ht 5\' 2"  (1.575 m)  Wt 130 lb (58.968 kg)  BMI 23.78 kg/m2  SpO2 93%  Physical Exam  Nursing note and vitals reviewed. Constitutional: She is oriented to person, place, and time. She appears well-developed and well-nourished. No distress.  HENT:  Head: Normocephalic and atraumatic.  Eyes: Conjunctivae  and EOM are normal. Pupils are equal, round, and reactive to light.  Neck: Normal range of motion. Neck supple.  Cardiovascular: Normal rate, regular rhythm, normal heart sounds and intact distal pulses.  Exam reveals no gallop and no friction rub.   No murmur heard. Pulmonary/Chest: Effort normal and breath sounds normal. No respiratory distress. She has no wheezes. She has no rales. She exhibits no tenderness.  Abdominal: Soft. Bowel sounds are normal. She exhibits no distension and no mass. There is tenderness. There is no rebound and no guarding.       TTP  of epigastric and RUQ with guarding but no rigidity or peritoneal signs.   Musculoskeletal: Normal range of motion. She exhibits no edema and no tenderness.  Neurological: She is alert and oriented to person, place, and time.  Skin: Skin is warm and dry. No rash noted. She is not diaphoretic. No erythema.  Psychiatric: She has a normal mood and affect.    ED Course  Procedures (including critical care time)  IV fluids, IV zofran and fentanyl. PR tylenol.    Date: 07/29/2011  Rate: 80  Rhythm: normal sinus rhythm  QRS Axis: normal  Intervals: normal  ST/T Wave abnormalities: nonspecific T wave changes  Conduction Disutrbances:first-degree A-V block   Narrative Interpretation:   Old EKG Reviewed: non provocative EKG compared to Nov 16, 2009    Labs Reviewed  CBC - Abnormal; Notable for the following:    RBC 3.43 (*)    Hemoglobin 10.4 (*)    HCT 31.5 (*)    All other components within normal limits  DIFFERENTIAL - Abnormal; Notable for the following:    Neutrophils Relative 83 (*)    Lymphocytes Relative 9 (*)    Lymphs Abs 0.6 (*)    All other components within normal limits  COMPREHENSIVE METABOLIC PANEL - Abnormal; Notable for the following:    Chloride 94 (*)    CO2 34 (*)    Glucose, Bld 116 (*)    Creatinine, Ser 3.87 (*)    ALT 41 (*)    GFR calc non Af Amer 10 (*)    GFR calc Af Amer 12 (*)    All other components within normal limits  URINALYSIS, ROUTINE W REFLEX MICROSCOPIC - Abnormal; Notable for the following:    Protein, ur 100 (*)    All other components within normal limits  PROTIME-INR - Abnormal; Notable for the following:    Prothrombin Time 35.6 (*)    INR 3.49 (*)    All other components within normal limits  LIPASE, BLOOD  URINE MICROSCOPIC-ADD ON   Labs and imaging faxed from [facility] with CT scan showing gallbladder wall thickening and Labs showing some elevation of Liver Enzymes.   US Abdomen Complete  07/29/2011  *RADIOLOGY  REPORT*  Clinical Data:  Right upper quadrant abdominal pain.  ABDOMINAL ULTRASOUND COMPLETE  Comparison:  CT on 11/14/2009  Findings:  Gallbladder:  A few tiny less than 5 mm mobile gallstones are seen as well as a small amount of layering echogenic sludge.  There is no evidence of gallbladder wall thickening or pericholecystic fluid.  Common Bile Duct:  Within normal limits in caliber. Measures 5 mm in diameter.  Liver: No focal mass lesion identified.  Within normal limits in parenchymal echogenicity.  IVC:  Appears normal.  Pancreas:  No abnormality identified.  Spleen:  Within normal limits in size and echotexture.  Right kidney:  A small size measuring 8.1 cm length.  Diffusely increased renal parenchymal echogenicity, consistent with medical renal disease.  Small cysts seen measuring up to 1 cm in size.  No evidence of renal mass or hydronephrosis.  Left kidney:  A small size, measuring 8.9 cm length.  Diffusely increased renal parenchymal echogenicity, consistent with medical renal disease.  Several renal cysts, largest measuring 2 cm.  No evidence of renal mass or hydronephrosis.  Abdominal Aorta:  No aneurysm identified.  IMPRESSION:  1.  Several tiny gallstones and gallbladder sludge.  No evidence of gallbladder wall thickening or biliary dilatation. 2.  Small kidneys with increased parenchymal echogenicity, consistent with chronic medical renal disease.  No evidence of hydronephrosis.  Original Report Authenticated By: Danae Orleans, M.D.     No diagnosis found.    MDM  Dr. Ignacia Palma has consulted Surgery and will admit patient to internal medicine. VSS.   Bilary colic and abdominal pain.    Medical screening examination/treatment/procedure(s) were conducted as a shared visit with non-physician practitioner(s) and myself.  I personally evaluated the patient during the encounter See my note.     Jenness Corner, [place] 07/29/11 1844  Carleene Cooper III, MD 07/29/11 [phone]

## 2011-07-30 ENCOUNTER — Inpatient Hospital Stay (HOSPITAL_COMMUNITY): Payer: Medicare Other

## 2011-07-30 LAB — COMPREHENSIVE METABOLIC PANEL
ALT: 32 U/L (ref 0–35)
AST: 28 U/L (ref 0–37)
Albumin: 2.9 g/dL — ABNORMAL LOW (ref 3.5–5.2)
Alkaline Phosphatase: 48 U/L (ref 39–117)
BUN: 28 mg/dL — ABNORMAL HIGH (ref 6–23)
Chloride: 97 mEq/L (ref 96–112)
Potassium: 4.2 mEq/L (ref 3.5–5.1)
Total Bilirubin: 0.4 mg/dL (ref 0.3–1.2)

## 2011-07-30 LAB — CBC
HCT: 30.4 % — ABNORMAL LOW (ref 36.0–46.0)
RDW: 13.4 % (ref 11.5–15.5)
WBC: 5.4 10*3/uL (ref 4.0–10.5)

## 2011-07-30 MED ORDER — TECHNETIUM TC 99M MEBROFENIN IV KIT
5.0000 | PACK | Freq: Once | INTRAVENOUS | Status: AC | PRN
  Administered 2011-07-30: 5 via INTRAVENOUS

## 2011-07-30 NOTE — Progress Notes (Signed)
Patient ID: Judy Wolf, female   DOB: 1928/10/01, 78 y.o.   MRN: [id number]  Assessment/Plan:  Principal Problem:  *Cholecystitis/nausea and vomiting/ abdominal pain:  - Follow up HIDA scan - continue zosyn - as per surgery lap chole once INR therapeutic  ESRD (end stage renal disease) on dialysis:  - Patient is on hemodialysis Monday, Wednesday, Friday  TIA (transient ischemic attack): - coumadin as per pharmacy - hold today as INR supratherapeuti  Diabetes mellitus:  - sliding scale insulin  HTN (hypertension):  - Continue home medications, amlodipine and clonidine    EDUCATION - test results and diagnostic studies were discussed with patientat the bedside - patient has verbalized the understanding - questions were answered at the bedside and contact information was provided for additional questions or concerns  Subjective: No events overnight. Patient denies chest pain, shortness of breath, abdominal pain. Had bowel movement and reports ambulating.  Objective:  Vital signs in last 24 hours:  Filed Vitals:   07/30/11 0555 07/30/11 0652 07/30/11 1053 07/30/11 1401  BP: 148/60  185/62 161/72  Pulse: 66  87 61  Temp: 98.6 F (37 C)   97.3 F (36.3 C)  TempSrc: Oral   Oral  Resp: 18  20 19   Height:      Weight:  61.5 kg (135 lb 9.3 oz)    SpO2: 92%   99%    Intake/Output from previous day:   Intake/Output Summary (Last 24 hours) at 07/30/11 1704 Last data filed at 07/30/11 1300  Gross per 24 hour  Intake    532 ml  Output      0 ml  Net    532 ml    Physical Exam: General: Alert, awake, oriented x3, in no acute distress. HEENT: No bruits, no goiter. Moist mucous membranes, no scleral icterus, no conjunctival pallor. Heart: Regular rate and rhythm, S1/S2 +, no murmurs, rubs, gallops. Lungs: Clear to auscultation bilaterally. No wheezing, no rhonchi, no rales.  Abdomen: tenderness over mid abdomen area to palpation with no guarding or rebound  tenderness Extremities: No clubbing or cyanosis, no pitting edema,  positive pedal pulses. Neuro: Grossly nonfocal.  Lab Results:  Basic Metabolic Panel:    Component Value Date/Time   NA 139 07/30/2011 0600   K 4.2 07/30/2011 0600   CL 97 07/30/2011 0600   CO2 30 07/30/2011 0600   BUN 28* 07/30/2011 0600   CREATININE 4.83* 07/30/2011 0600   GLUCOSE 84 07/30/2011 0600   CALCIUM 9.3 07/30/2011 0600   CBC:    Component Value Date/Time   WBC 5.4 07/30/2011 0600   HGB 10.0* 07/30/2011 0600   HCT 30.4* 07/30/2011 0600   PLT 267 07/30/2011 0600   MCV 92.4 07/30/2011 0600   NEUTROABS 5.6 07/29/2011 1531   LYMPHSABS 0.6* 07/29/2011 1531   MONOABS 0.6 07/29/2011 1531   EOSABS 0.0 07/29/2011 1531   BASOSABS 0.0 07/29/2011 1531      Lab 07/30/11 0600 07/29/11 1531  WBC 5.4 6.8  HGB 10.0* 10.4*  HCT 30.4* 31.5*  PLT 267 282  MCV 92.4 91.8  MCH 30.4 30.3  MCHC 32.9 33.0  RDW 13.4 13.5  LYMPHSABS -- 0.6*  MONOABS -- 0.6  EOSABS -- 0.0  BASOSABS -- 0.0  BANDABS -- --    Lab 07/30/11 0600 07/29/11 1531  NA 139 136  K 4.2 3.6  CL 97 94*  CO2 30 34*  GLUCOSE 84 116*  BUN 28* 19  CREATININE 4.83* 3.87*  CALCIUM 9.3  9.9  MG -- --    Lab 07/30/11 0600 07/29/11 1531  INR 3.94* 3.49*  PROTIME -- --   Cardiac markers: No results found for this basename: CK:3,CKMB:3,TROPONINI:3,MYOGLOBIN:3 in the last 168 hours No components found with this basename: POCBNP:3 No results found for this or any previous visit (from the past 240 hour(s)).  Studies/Results: Nm Hepatobiliary Liver Func  07/30/2011  *RADIOLOGY REPORT*  Clinical Data: Rule out acute cholecystitis  NUCLEAR MEDICINE HEPATOHBILIARY INCLUDE GB  Radiopharmaceutical: 5.5 mCi Tc 24m Choletec  Comparison: gallbladder ultrasound 07/29/2011  Findings: There is normal uptake of the tracer by the liver. Gallbladder is visualized at 20 minutes.  CBD is visualized at 35 minutes.  Bowel activity is noted at 45 minutes.  IMPRESSION: No evidence of cholecystitis  with gallbladder visualized at 20 minutes.  No cystic duct obstruction.  No CBD obstruction. Bowel activity noted at 45 minutes.  Original Report Authenticated By: Natasha Mead, M.D.   US Abdomen Complete  07/29/2011  *RADIOLOGY REPORT*  Clinical Data:  Right upper quadrant abdominal pain.  ABDOMINAL ULTRASOUND COMPLETE  Comparison:  CT on 11/14/2009  Findings:  Gallbladder:  A few tiny less than 5 mm mobile gallstones are seen as well as a small amount of layering echogenic sludge.  There is no evidence of gallbladder wall thickening or pericholecystic fluid.  Common Bile Duct:  Within normal limits in caliber. Measures 5 mm in diameter.  Liver: No focal mass lesion identified.  Within normal limits in parenchymal echogenicity.  IVC:  Appears normal.  Pancreas:  No abnormality identified.  Spleen:  Within normal limits in size and echotexture.  Right kidney:  A small size measuring 8.1 cm length.  Diffusely increased renal parenchymal echogenicity, consistent with medical renal disease.  Small cysts seen measuring up to 1 cm in size.  No evidence of renal mass or hydronephrosis.  Left kidney:  A small size, measuring 8.9 cm length.  Diffusely increased renal parenchymal echogenicity, consistent with medical renal disease.  Several renal cysts, largest measuring 2 cm.  No evidence of renal mass or hydronephrosis.  Abdominal Aorta:  No aneurysm identified.  IMPRESSION:  1.  Several tiny gallstones and gallbladder sludge.  No evidence of gallbladder wall thickening or biliary dilatation. 2.  Small kidneys with increased parenchymal echogenicity, consistent with chronic medical renal disease.  No evidence of hydronephrosis.  Original Report Authenticated By: Danae Orleans, M.D.    Medications: Scheduled Meds:   . amLODipine  10 mg Oral BID  . cloNIDine  0.1 mg Oral TID  . fentaNYL  50 mcg Intravenous Once  . ondansetron  4 mg Intravenous Once  . pantoprazole  40 mg Oral Q breakfast  . piperacillin-tazobactam  (ZOSYN)  IV  2.25 g Intravenous Q8H  . sodium chloride  3 mL Intravenous Q12H  . white petrolatum      . DISCONTD: sodium chloride   Intravenous STAT   Continuous Infusions:  PRN Meds:.acetaminophen, acetaminophen, HYDROcodone-acetaminophen, HYDROmorphone, ondansetron (ZOFRAN) IV, ondansetron, technetium TC 61M mebrofenin, DISCONTD: HYDROcodone-acetaminophen, DISCONTD:  HYDROmorphone (DILAUDID) injection, DISCONTD: ondansetron (ZOFRAN) IV   LOS: 1 day   Zhoe Catania 07/30/2011, 5:04 PM  [facility] Pager: [phone]

## 2011-07-30 NOTE — Progress Notes (Signed)
Patient ID: Judy Wolf, female   DOB: 08/02/1928, 78 y.o.   MRN: [id number]    Subjective: Pt c/o some epigastric abdominal pain.  No nausea  Objective: Vital signs in last 24 hours: Temp:  [98 F (36.7 C)-101.2 F (38.4 C)] 98.6 F (37 C) (02/09 0555) Pulse Rate:  [66-82] 66  (02/09 0555) Resp:  [13-20] 18  (02/09 0555) BP: (148-196)/(45-64) 148/60 mmHg (02/09 0555) SpO2:  [92 %-98 %] 92 % (02/09 0555) Weight:  [130 lb (58.968 kg)-135 lb 9.3 oz (61.5 kg)] 135 lb 9.3 oz (61.5 kg) (02/09 0652) Last BM Date: 07/29/11  Intake/Output from previous day: 02/08 0701 - 02/09 0700 In: 52 [IV Piggyback:52] Out: -  Intake/Output this shift:    PE: Abd: soft, mild tenderness in epigastrum and RUQ.  +BS, ND Heart: regular Lungs: CTAB  Lab Results:   Basename 07/30/11 0600 07/29/11 1531  WBC 5.4 6.8  HGB 10.0* 10.4*  HCT 30.4* 31.5*  PLT 267 282   BMET  Basename 07/30/11 0600 07/29/11 1531  NA 139 136  K 4.2 3.6  CL 97 94*  CO2 30 34*  GLUCOSE 84 116*  BUN 28* 19  CREATININE 4.83* 3.87*  CALCIUM 9.3 9.9   PT/INR  Basename 07/30/11 0600 07/29/11 1531  LABPROT 39.1* 35.6*  INR 3.94* 3.49*     Studies/Results: US Abdomen Complete  07/29/2011  *RADIOLOGY REPORT*  Clinical Data:  Right upper quadrant abdominal pain.  ABDOMINAL ULTRASOUND COMPLETE  Comparison:  CT on 11/14/2009  Findings:  Gallbladder:  A few tiny less than 5 mm mobile gallstones are seen as well as a small amount of layering echogenic sludge.  There is no evidence of gallbladder wall thickening or pericholecystic fluid.  Common Bile Duct:  Within normal limits in caliber. Measures 5 mm in diameter.  Liver: No focal mass lesion identified.  Within normal limits in parenchymal echogenicity.  IVC:  Appears normal.  Pancreas:  No abnormality identified.  Spleen:  Within normal limits in size and echotexture.  Right kidney:  A small size measuring 8.1 cm length.  Diffusely increased renal parenchymal echogenicity,  consistent with medical renal disease.  Small cysts seen measuring up to 1 cm in size.  No evidence of renal mass or hydronephrosis.  Left kidney:  A small size, measuring 8.9 cm length.  Diffusely increased renal parenchymal echogenicity, consistent with medical renal disease.  Several renal cysts, largest measuring 2 cm.  No evidence of renal mass or hydronephrosis.  Abdominal Aorta:  No aneurysm identified.  IMPRESSION:  1.  Several tiny gallstones and gallbladder sludge.  No evidence of gallbladder wall thickening or biliary dilatation. 2.  Small kidneys with increased parenchymal echogenicity, consistent with chronic medical renal disease.  No evidence of hydronephrosis.  Original Report Authenticated By: Danae Orleans, M.D.    Anti-infectives: Anti-infectives     Start     Dose/Rate Route Frequency Ordered Stop   07/29/11 2200  piperacillin-tazobactam (ZOSYN) IVPB 2.25 g       2.25 g 100 mL/hr over 30 Minutes Intravenous 3 times per day 07/29/11 2124             Assessment/Plan  1. Symptomatic cholelithiasis 2. ESRD 3. PVD 4. On chronic coumadin  Plan: 1. Await HIDA scan 2. Cont to hold coumadin and allow INR to drift down. 3. Will follow.   LOS: 1 day    Wymon Swaney E 07/30/2011

## 2011-07-30 NOTE — Progress Notes (Signed)
HIDA negative for cholecystitis, still appears to have sx cholelithiasis but can wait for inr to decrease to nl then plan lap chole once this is normal

## 2011-07-31 LAB — URIC ACID: Uric Acid, Serum: 5 mg/dL (ref 2.4–7.0)

## 2011-07-31 NOTE — Evaluation (Signed)
Physical Therapy Evaluation Patient Details Name: Judy Wolf MRN: [id number] DOB: [birth year] Today's Date: 07/31/2011  Problem List:  Patient Active Problem List  Diagnoses  . MUSCLE SPASM  . ESRD (end stage renal disease) on dialysis  . Nausea & vomiting  . Abdominal  pain, other specified site  . TIA (transient ischemic attack)  . PVD (peripheral vascular disease)  . Diabetes mellitus  . Cholecystitis  . HTN (hypertension)    Past Medical History:  Past Medical History  Diagnosis Date  . Hypertension   . Renal disorder    Past Surgical History: History reviewed. No pertinent past surgical history.  PT Assessment/Plan/Recommendation PT Assessment Clinical Impression Statement: Patient is an 78 yo female admitted with abdominal pain.  Patient with gallbladder disease - awaiting lap cholecystectomy.  Patient did very well with mobility today. Noted decreased balance, decreased use of assistive device, and decreased activity tolerance.  Recommend RW, 3-n-1 BSC, and HHPT at discharge.  Patient also asking about meals-on-wheels.  Recommend CM consult.  PT Recommendation/Assessment: Patient will need skilled PT in the acute care venue PT Problem List: Decreased strength;Decreased activity tolerance;Decreased mobility;Decreased knowledge of use of DME;Decreased balance PT Therapy Diagnosis : Abnormality of gait;Difficulty walking;Generalized weakness PT Plan PT Frequency: Min 3X/week PT Treatment/Interventions: DME instruction;Gait training;Stair training;Functional mobility training;Balance training;Therapeutic exercise;Patient/family education PT Recommendation Recommendations for Other Services: Other (comment) (Case Management (Meals-on-Wheels)) Follow Up Recommendations: Home health PT Equipment Recommended: Rolling walker with 5" wheels;3 in 1 bedside comode PT Goals  Acute Rehab PT Goals PT Goal Formulation: With patient Time For Goal Achievement: 7 days Pt will go Sit  to Stand: with modified independence;with upper extremity assist PT Goal: Sit to Stand - Progress: Goal set today Pt will go Stand to Sit: with modified independence;with upper extremity assist PT Goal: Stand to Sit - Progress: Goal set today Pt will Ambulate: >150 feet;with modified independence;with rolling walker PT Goal: Ambulate - Progress: Goal set today Pt will Go Up / Down Stairs: 3-5 stairs;with modified independence;with least restrictive assistive device PT Goal: Up/Down Stairs - Progress: Goal set today  PT Evaluation Precautions/Restrictions  Precautions Precaution Comments: None Required Braces or Orthoses: No Restrictions Weight Bearing Restrictions: No Prior Functioning  Home Living Lives With: Alone Receives Help From: Family Type of Home: Apartment Home Layout: One level Home Access: Stairs to enter Entrance Stairs-Rails: Doctor, general practice of Steps: 4 Bathroom Shower/Tub: Forensic scientist: Standard Home Adaptive Equipment: Straight cane;Shower chair with back Prior Function Level of Independence: Independent with basic ADLs;Independent with gait;Requires assistive device for independence;Needs assistance with homemaking Driving: No Cognition Cognition Arousal/Alertness: Awake/alert Overall Cognitive Status: Appears within functional limits for tasks assessed Sensation/Coordination Coordination Gross Motor Movements are Fluid and Coordinated: Yes Extremity Assessment RUE Assessment RUE Assessment: Within Functional Limits LUE Assessment LUE Assessment: Within Functional Limits RLE Assessment RLE Assessment: Within Functional Limits LLE Assessment LLE Assessment: Within Functional Limits Mobility (including Balance) Bed Mobility Bed Mobility: Yes Rolling Left: 6: Modified independent (Device/Increase time);With rail Left Sidelying to Sit: 6: Modified independent (Device/Increase time);With rails;HOB elevated  (comment degrees) (HOB at 30 degrees) Sitting - Scoot to Edge of Bed: 7: Independent Transfers Transfers: Yes Sit to Stand: With upper extremity assist;From bed;5: Supervision (Cues for hand placement) Sit to Stand Details (indicate cue type and reason): Provided cues for proper hand placement when using RW. Stand to Sit: 6: Modified independent (Device/Increase time);With upper extremity assist;To bed Ambulation/Gait Ambulation/Gait: Yes Ambulation/Gait Assistance: 5: Supervision Ambulation/Gait Assistance Details (  indicate cue type and reason): Cues for safe use of RW.  Cues to look forward during gait Ambulation Distance (Feet): 186 Feet Assistive device: Rolling walker Gait Pattern: Step-through pattern;Trunk flexed  Posture/Postural Control Posture/Postural Control: No significant limitations Exercise    End of Session PT - End of Session Equipment Utilized During Treatment: Gait belt Activity Tolerance: Patient tolerated treatment well Patient left: in bed;with call bell in reach;with family/visitor present (sitting on EOB) Nurse Communication: Mobility status for ambulation General Behavior During Session: [facility] for tasks performed Cognition: [facility] for tasks performed  Vena Austria [phone] 07/31/2011, 2:29 PM

## 2011-07-31 NOTE — Progress Notes (Signed)
She pretty clearly has symptomatic gallbladder disease but does not have cholecystitis.  Would check INR daily and when <1.5 will plan lap chole.  If OK with medical team can give vitamin k.

## 2011-07-31 NOTE — Progress Notes (Signed)
Patient ID: Judy Wolf, female   DOB: February 13, 1929, 78 y.o.   MRN: [id number]    Subjective: Pt still with mild RUQ discomfort.  Tolerating clears  Objective: Vital signs in last 24 hours: Temp:  [97.3 F (36.3 C)-98.8 F (37.1 C)] 97.7 F (36.5 C) (02/10 0438) Pulse Rate:  [52-87] 52  (02/10 0438) Resp:  [18-20] 18  (02/10 0438) BP: (146-185)/(60-72) 152/61 mmHg (02/10 0438) SpO2:  [95 %-99 %] 98 % (02/10 0438) Weight:  [136 lb 11 oz (62 kg)] 136 lb 11 oz (62 kg) (02/09 2249) Last BM Date: 07/29/11  Intake/Output from previous day: 02/09 0701 - 02/10 0700 In: 960 [P.O.:960] Out: -  Intake/Output this shift:    PE: Abd: soft, mild tenderness in RUQ, ND, +BS  Lab Results:   [facility] 07/30/11 0600 07/29/11 1531  WBC 5.4 6.8  HGB 10.0* 10.4*  HCT 30.4* 31.5*  PLT 267 282   BMET  Basename 07/30/11 0600 07/29/11 1531  NA 139 136  K 4.2 3.6  CL 97 94*  CO2 30 34*  GLUCOSE 84 116*  BUN 28* 19  CREATININE 4.83* 3.87*  CALCIUM 9.3 9.9   PT/INR  Basename 07/30/11 0600 07/29/11 1531  LABPROT 39.1* 35.6*  INR 3.94* 3.49*     Studies/Results: Nm Hepatobiliary Liver Func  07/30/2011  *RADIOLOGY REPORT*  Clinical Data: Rule out acute cholecystitis  NUCLEAR MEDICINE HEPATOHBILIARY INCLUDE GB  Radiopharmaceutical: 5.5 mCi Tc 32m Choletec  Comparison: gallbladder ultrasound 07/29/2011  Findings: There is normal uptake of the tracer by the liver. Gallbladder is visualized at 20 minutes.  CBD is visualized at 35 minutes.  Bowel activity is noted at 45 minutes.  IMPRESSION: No evidence of cholecystitis with gallbladder visualized at 20 minutes.  No cystic duct obstruction.  No CBD obstruction. Bowel activity noted at 45 minutes.  Original Report Authenticated By: Natasha Mead, M.D.   US Abdomen Complete  07/29/2011  *RADIOLOGY REPORT*  Clinical Data:  Right upper quadrant abdominal pain.  ABDOMINAL ULTRASOUND COMPLETE  Comparison:  CT on 11/14/2009  Findings:  Gallbladder:  A few  tiny less than 5 mm mobile gallstones are seen as well as a small amount of layering echogenic sludge.  There is no evidence of gallbladder wall thickening or pericholecystic fluid.  Common Bile Duct:  Within normal limits in caliber. Measures 5 mm in diameter.  Liver: No focal mass lesion identified.  Within normal limits in parenchymal echogenicity.  IVC:  Appears normal.  Pancreas:  No abnormality identified.  Spleen:  Within normal limits in size and echotexture.  Right kidney:  A small size measuring 8.1 cm length.  Diffusely increased renal parenchymal echogenicity, consistent with medical renal disease.  Small cysts seen measuring up to 1 cm in size.  No evidence of renal mass or hydronephrosis.  Left kidney:  A small size, measuring 8.9 cm length.  Diffusely increased renal parenchymal echogenicity, consistent with medical renal disease.  Several renal cysts, largest measuring 2 cm.  No evidence of renal mass or hydronephrosis.  Abdominal Aorta:  No aneurysm identified.  IMPRESSION:  1.  Several tiny gallstones and gallbladder sludge.  No evidence of gallbladder wall thickening or biliary dilatation. 2.  Small kidneys with increased parenchymal echogenicity, consistent with chronic medical renal disease.  No evidence of hydronephrosis.  Original Report Authenticated By: Danae Orleans, M.D.    Anti-infectives: Anti-infectives     Start     Dose/Rate Route Frequency Ordered Stop   07/29/11 2200  piperacillin-tazobactam (ZOSYN) IVPB 2.25 g       2.25 g 100 mL/hr over 30 Minutes Intravenous 3 times per day 07/29/11 2124             Assessment/Plan  1. Symptomatic cholelithiasis 2. ESRD 3. On coumadin  Plan: 1. Await INR to drift down to between 1.5 and 1.7.  Once this happens, then we can proceed to the operating room for a cholecystectomy.  I do believe that the patient's gallstones are what is causing her discomfort.  Cont IV abx.  Will follow 2. Daily INR   LOS: 2 days     Tamicka Shimon E 07/31/2011

## 2011-07-31 NOTE — Progress Notes (Signed)
Patient ID: Judy Wolf, female   DOB: 1928-08-20, 78 y.o.   MRN: [id number]  Assessment/Plan:   Principal Problem:   *Cholelithiasis/nausea and vomiting/ abdominal pain:  - plan for lap chole once INR below 1.5 - continue zosyn  - surgery is following  ESRD (end stage renal disease) on dialysis:  - Patient is on hemodialysis Monday, Wednesday, Friday   TIA (transient ischemic attack):  - coumadin as per pharmacy  - hold -  INR supratherapeuti   Diabetes mellitus:  - sliding scale insulin   HTN (hypertension):  - Continue home medications, amlodipine and clonidine   EDUCATION  - test results and diagnostic studies were discussed with patientat the bedside  - patient has verbalized the understanding  - questions were answered at the bedside and contact information was provided for additional questions or concerns   Subjective: No events overnight. Patient denies chest pain, shortness of breath, abdominal pain.   Objective:  Vital signs in last 24 hours:  Filed Vitals:   07/30/11 2146 07/30/11 2249 07/31/11 0438 07/31/11 0900  BP: 162/61  152/61 154/68  Pulse: 57  52 48  Temp: 98.8 F (37.1 C)  97.7 F (36.5 C) 97.5 F (36.4 C)  TempSrc: Oral  Oral Oral  Resp: 18  18 18   Height:  5\' 2"  (1.575 m)    Weight:  62 kg (136 lb 11 oz)    SpO2: 95%  98% 98%    Intake/Output from previous day:   Intake/Output Summary (Last 24 hours) at 07/31/11 1320 Last data filed at 07/31/11 0900  Gross per 24 hour  Intake    960 ml  Output      0 ml  Net    960 ml    Physical Exam: General: Alert, awake, oriented x3, in no acute distress. HEENT: No bruits, no goiter. Moist mucous membranes, no scleral icterus, no conjunctival pallor. Heart: Regular rate and rhythm, S1/S2 +, no murmurs, rubs, gallops. Lungs: Clear to auscultation bilaterally. No wheezing, no rhonchi, no rales.  Abdomen: Soft, nontender, nondistended, positive bowel sounds. Extremities: No clubbing or  cyanosis, no pitting edema,  positive pedal pulses. Neuro: Grossly nonfocal.  Lab Results:  Basic Metabolic Panel:    Component Value Date/Time   NA 139 07/30/2011 0600   K 4.2 07/30/2011 0600   CL 97 07/30/2011 0600   CO2 30 07/30/2011 0600   BUN 28* 07/30/2011 0600   CREATININE 4.83* 07/30/2011 0600   GLUCOSE 84 07/30/2011 0600   CALCIUM 9.3 07/30/2011 0600   CBC:    Component Value Date/Time   WBC 5.4 07/30/2011 0600   HGB 10.0* 07/30/2011 0600   HCT 30.4* 07/30/2011 0600   PLT 267 07/30/2011 0600   MCV 92.4 07/30/2011 0600   NEUTROABS 5.6 07/29/2011 1531   LYMPHSABS 0.6* 07/29/2011 1531   MONOABS 0.6 07/29/2011 1531   EOSABS 0.0 07/29/2011 1531   BASOSABS 0.0 07/29/2011 1531      Lab 07/30/11 0600 07/29/11 1531  WBC 5.4 6.8  HGB 10.0* 10.4*  HCT 30.4* 31.5*  PLT 267 282  MCV 92.4 91.8  MCH 30.4 30.3  MCHC 32.9 33.0  RDW 13.4 13.5  LYMPHSABS -- 0.6*  MONOABS -- 0.6  EOSABS -- 0.0  BASOSABS -- 0.0  BANDABS -- --    Lab 07/30/11 0600 07/29/11 1531  NA 139 136  K 4.2 3.6  CL 97 94*  CO2 30 34*  GLUCOSE 84 116*  BUN 28* 19  CREATININE 4.83*  3.87*  CALCIUM 9.3 9.9  MG -- --    Lab 07/31/11 0926 07/30/11 0600 07/29/11 1531  INR 3.85* 3.94* 3.49*  PROTIME -- -- --   Cardiac markers: No results found for this basename: CK:3,CKMB:3,TROPONINI:3,MYOGLOBIN:3 in the last 168 hours No components found with this basename: POCBNP:3 No results found for this or any previous visit (from the past 240 hour(s)).  Studies/Results: Nm Hepatobiliary Liver Func  07/30/2011  *RADIOLOGY REPORT*  Clinical Data: Rule out acute cholecystitis  NUCLEAR MEDICINE HEPATOHBILIARY INCLUDE GB  Radiopharmaceutical: 5.5 mCi Tc 73m Choletec  Comparison: gallbladder ultrasound 07/29/2011  Findings: There is normal uptake of the tracer by the liver. Gallbladder is visualized at 20 minutes.  CBD is visualized at 35 minutes.  Bowel activity is noted at 45 minutes.  IMPRESSION: No evidence of cholecystitis with gallbladder  visualized at 20 minutes.  No cystic duct obstruction.  No CBD obstruction. Bowel activity noted at 45 minutes.  Original Report Authenticated By: Natasha Mead, M.D.   US Abdomen Complete  07/29/2011  *RADIOLOGY REPORT*  Clinical Data:  Right upper quadrant abdominal pain.  ABDOMINAL ULTRASOUND COMPLETE  Comparison:  CT on 11/14/2009  Findings:  Gallbladder:  A few tiny less than 5 mm mobile gallstones are seen as well as a small amount of layering echogenic sludge.  There is no evidence of gallbladder wall thickening or pericholecystic fluid.  Common Bile Duct:  Within normal limits in caliber. Measures 5 mm in diameter.  Liver: No focal mass lesion identified.  Within normal limits in parenchymal echogenicity.  IVC:  Appears normal.  Pancreas:  No abnormality identified.  Spleen:  Within normal limits in size and echotexture.  Right kidney:  A small size measuring 8.1 cm length.  Diffusely increased renal parenchymal echogenicity, consistent with medical renal disease.  Small cysts seen measuring up to 1 cm in size.  No evidence of renal mass or hydronephrosis.  Left kidney:  A small size, measuring 8.9 cm length.  Diffusely increased renal parenchymal echogenicity, consistent with medical renal disease.  Several renal cysts, largest measuring 2 cm.  No evidence of renal mass or hydronephrosis.  Abdominal Aorta:  No aneurysm identified.  IMPRESSION:  1.  Several tiny gallstones and gallbladder sludge.  No evidence of gallbladder wall thickening or biliary dilatation. 2.  Small kidneys with increased parenchymal echogenicity, consistent with chronic medical renal disease.  No evidence of hydronephrosis.  Original Report Authenticated By: Danae Orleans, M.D.    Medications: Scheduled Meds:   . amLODipine  10 mg Oral BID  . cloNIDine  0.1 mg Oral TID  . pantoprazole  40 mg Oral Q breakfast  . piperacillin-tazobactam (ZOSYN)  IV  2.25 g Intravenous Q8H  . sodium chloride  3 mL Intravenous Q12H   Continuous  Infusions:  PRN Meds:.acetaminophen, acetaminophen, HYDROcodone-acetaminophen, HYDROmorphone, ondansetron (ZOFRAN) IV, ondansetron   LOS: 2 days   Keigan Tafoya 07/31/2011, 1:20 PM  [facility] Pager: [phone]

## 2011-08-01 ENCOUNTER — Inpatient Hospital Stay (HOSPITAL_COMMUNITY): Payer: Medicare Other

## 2011-08-01 LAB — RENAL FUNCTION PANEL
Albumin: 3.2 g/dL — ABNORMAL LOW (ref 3.5–5.2)
Calcium: 9.1 mg/dL (ref 8.4–10.5)
Creatinine, Ser: 7.48 mg/dL — ABNORMAL HIGH (ref 0.50–1.10)
GFR calc non Af Amer: 5 mL/min — ABNORMAL LOW (ref >90)

## 2011-08-01 LAB — CBC
MCH: 29.9 pg (ref 26.0–34.0)
MCHC: 33.9 g/dL (ref 30.0–36.0)
MCV: 88.3 fL (ref 78.0–100.0)
Platelets: 310 10*3/uL (ref 150–400)

## 2011-08-01 LAB — PROTIME-INR
INR: 3.03 — ABNORMAL HIGH (ref 0.00–1.49)
Prothrombin Time: 31.9 seconds — ABNORMAL HIGH (ref 11.6–15.2)

## 2011-08-01 MED ORDER — HYDROMORPHONE HCL PF 1 MG/ML IJ SOLN
INTRAMUSCULAR | Status: AC
  Administered 2011-08-01: 1 mg via INTRAVENOUS
  Filled 2011-08-01: qty 1

## 2011-08-01 MED ORDER — VITAMIN K1 10 MG/ML IJ SOLN
10.0000 mg | Freq: Once | INTRAVENOUS | Status: AC
  Administered 2011-08-01: 10 mg via INTRAVENOUS
  Filled 2011-08-01: qty 1

## 2011-08-01 NOTE — Progress Notes (Signed)
pts bp 214/94 hr 93.  Right sided jaw pain. Notified PA, instructed to follow up with po clonidine and recheck pressure. Will cont to monitor.

## 2011-08-01 NOTE — Progress Notes (Signed)
Patient ID: Judy Wolf, female   DOB: 20-Jun-1929, 78 y.o.   MRN: [id number]    Subjective: Pt c/o some epigastric pain this morning.  Seems a little more uncomfortable.  Still tolerating clear liquids  Objective: Vital signs in last 24 hours: Temp:  [97.5 F (36.4 C)-98.2 F (36.8 C)] 98.2 F (36.8 C) (02/11 0500) Pulse Rate:  [48-59] 57  (02/11 0500) Resp:  [18-19] 18  (02/11 0500) BP: (154-172)/(65-69) 160/68 mmHg (02/11 0500) SpO2:  [95 %-98 %] 96 % (02/11 0500) Weight:  [136 lb 14.5 oz (62.1 kg)] 136 lb 14.5 oz (62.1 kg) (02/10 2010) Last BM Date: 07/30/11  Intake/Output from previous day: 02/10 0701 - 02/11 0700 In: 940 [P.O.:840; IV Piggyback:100] Out: 0  Intake/Output this shift:    PE: Abd: soft, mild epigastric tenderness, +BS, ND  Lab Results:   Basename 07/30/11 0600 07/29/11 1531  WBC 5.4 6.8  HGB 10.0* 10.4*  HCT 30.4* 31.5*  PLT 267 282   BMET  Basename 07/30/11 0600 07/29/11 1531  NA 139 136  K 4.2 3.6  CL 97 94*  CO2 30 34*  GLUCOSE 84 116*  BUN 28* 19  CREATININE 4.83* 3.87*  CALCIUM 9.3 9.9   PT/INR  Basename 08/01/11 0525 07/31/11 0926  LABPROT 31.9* 38.4*  INR 3.03* 3.85*     Studies/Results: Nm Hepatobiliary Liver Func  07/30/2011  *RADIOLOGY REPORT*  Clinical Data: Rule out acute cholecystitis  NUCLEAR MEDICINE HEPATOHBILIARY INCLUDE GB  Radiopharmaceutical: 5.5 mCi Tc 3m Choletec  Comparison: gallbladder ultrasound 07/29/2011  Findings: There is normal uptake of the tracer by the liver. Gallbladder is visualized at 20 minutes.  CBD is visualized at 35 minutes.  Bowel activity is noted at 45 minutes.  IMPRESSION: No evidence of cholecystitis with gallbladder visualized at 20 minutes.  No cystic duct obstruction.  No CBD obstruction. Bowel activity noted at 45 minutes.  Original Report Authenticated By: Natasha Mead, M.D.    Anti-infectives: Anti-infectives     Start     Dose/Rate Route Frequency Ordered Stop   07/29/11 2200   piperacillin-tazobactam (ZOSYN) IVPB 2.25 g       2.25 g 100 mL/hr over 30 Minutes Intravenous 3 times per day 07/29/11 2124             Assessment/Plan  1. Symptomatic cholelithiasis 2. ESRD 3. Chronic coumadin  Plan: 1. Will give 10mg  of Vit K today to help INR trend down. 2. Will make NPO incase INR at an acceptable level to operate tomorrow. 3. Cont clears today.   LOS: 3 days    Khadar Monger E 08/01/2011

## 2011-08-01 NOTE — Procedures (Signed)
[facility] Renal Consultation Note  Indication for Consultation:  Management of ESRD/hemodialysis; anemia, hypertension/volume and secondary hyperparathyroidism  HPI: Judy Wolf is a 78 y.o. female admitted with abdominal pain , nausea , and vomiting after last Friday hd  With plans for Laparoscopic Cholecystectomy in am if INR is safe.She is on Coumadin with history of TIA, Right dvt and pulmonary embolism June 2010 . Stable at out patient kidney center.  Dialysis Orders: Center: [facility]  on [facility] . EDW 58.5 HD Bath 2k, 2.5 ca  Time 3.5 hrs Heparin tight. Access left forearm avf BFR 400 DFR 600    Zemplar 3 mcg IV/HD Epogen 2400   Units IV/HD  Venofer  0  Other 0    Past Medical History  Diagnosis Date  . Hypertension   . Renal disorder     History reviewed. No pertinent past surgical history.   History reviewed. No pertinent family history.    reports that she has never smoked. She does not have any smokeless tobacco history on file. She reports that she does not drink alcohol or use illicit drugs.  No Known Allergies  Prior to Admission medications   Medication Sig Start Date End Date Taking? Authorizing Provider  amLODipine (NORVASC) 10 MG tablet Take 10 mg by mouth 2 (two) times daily.   Yes Historical Provider, MD  cloNIDine (CATAPRES) 0.1 MG tablet Take 0.1 mg by mouth 3 (three) times daily.   Yes Historical Provider, MD  HYDROcodone-acetaminophen (VICODIN) 5-500 MG per tablet Take 1 tablet by mouth daily as needed. pain   Yes Historical Provider, MD  warfarin (COUMADIN) 5 MG tablet Take 5 mg by mouth daily. 1 tablet on Tuesday, 1 and 1/2 tablet all other days.   Yes Historical Provider, MD    JXB:[id number], acetaminophen, HYDROcodone-acetaminophen, HYDROmorphone, ondansetron (ZOFRAN) IV, ondansetron  Results for orders placed during the hospital encounter of 07/29/11 (from the past 48 hour(s))  PROTIME-INR     Status: Abnormal   Collection Time   07/31/11  9:26 AM      Component Value Range Comment   Prothrombin Time 38.4 (*) 11.6 - 15.2 (seconds)    INR 3.85 (*) 0.00 - 1.49    URIC ACID     Status: Normal   Collection Time   07/31/11  9:26 AM      Component Value Range Comment   Uric Acid, Serum 5.0  2.4 - 7.0 (mg/dL)   PROTIME-INR     Status: Abnormal   Collection Time   08/01/11  5:25 AM      Component Value Range Comment   Prothrombin Time 31.9 (*) 11.6 - 15.2 (seconds)    INR 3.03 (*) 0.00 - 1.49     EKG: normal EKG, normal sinus rhythm, unchanged from previous tracings, occasional PVC noted, unifocal.  ROS: fever not recorded, sweats, n/v and decreased appetite, no sob ,no cp. Vomiting since past Thursday.  Physical Exam: Filed Vitals:   08/01/11 1300  BP: 160/65  Pulse: 60  Temp: 97.9 F (36.6 C)  Resp: 18     General:alert, black female, NAD, appropriate, thin HEENT:O[place], mmm Eyes:eomi Neck:supple, no jvd Heart:RRR, 2/6 SEM LLSB, no rub Lungs: CTA Abdomen:Soft, nt, bs+ Extremities:no pedal edema Skin:warm, no overt ulcers seen Neuro:ox3, no acute deficits Dialysis Access:pos. Bruit left fa avf  Assessment/Plan: 1.Symptomatic cholelithiasis= per [facility]; surgery when inr lower. Admitted on 2/8 after outpatient HD that day with N/V and abd pain.  2. ESRD -[facility] [place], tight  hep.  3  Hypertension/volume  - hd and norvasc/ clonidine.  3-4 kg over EDW currently. For HD tonight, remove 4 kg as tolerated. 4. Anemia  - epogen on hd, , check hgb pre hd 5. Metabolic bone disease -  zemplar on hd , phoslo binder, check ca and phos 6. TIA / hx pe and dvt= coumadin supratherapeutic on admit 3.94. S/P vit K 10 mg today   Lenny Pastel, PA-C [facility] Beeper [phone] 08/01/2011, 4:39 PM

## 2011-08-01 NOTE — Progress Notes (Signed)
Patient ID: Judy Wolf, female   DOB: 1929-04-29, 78 y.o.   MRN: [id number]  Assessment/Plan:   Principal Problem:   *Cholelithiasis/nausea and vomiting/ abdominal pain:  - plan for lap chole once INR below 1.5  - continue zosyn  - surgery is following   ESRD (end stage renal disease) on dialysis:  - Patient is on hemodialysis Monday, Wednesday, Friday   TIA (transient ischemic attack):  - coumadin as per pharmacy  - hold - INR supra therapeutic - patient to get vitamin K today for possible surgery tomorrow  Diabetes mellitus:  - sliding scale insulin   HTN (hypertension):  - Continue home medications, amlodipine and clonidine   EDUCATION  - test results and diagnostic studies were discussed with patientat the bedside  - patient has verbalized the understanding  - questions were answered at the bedside and contact information was provided for additional questions or concerns    Subjective:  No events overnight. Patient denies chest pain, shortness of breath, reports some abdominal pain.  Objective:  Vital signs in last 24 hours:  Filed Vitals:   08/01/11 1710 08/01/11 1726 08/01/11 1737 08/01/11 2044  BP: 216/74 185/69 175/70   Pulse:  93 90 63  Temp:   97.7 F (36.5 C) 97.6 F (36.4 C)  TempSrc:   Oral Oral  Resp:   18 13  Height:      Weight:      SpO2:   99% 96%    Intake/Output from previous day:   Intake/Output Summary (Last 24 hours) at 08/01/11 2056 Last data filed at 08/01/11 1947  Gross per 24 hour  Intake    940 ml  Output      0 ml  Net    940 ml    Physical Exam: General: Alert, awake, oriented x3, in no acute distress. HEENT: No bruits, no goiter. Moist mucous membranes, no scleral icterus, no conjunctival pallor. Heart: Regular rate and rhythm, S1/S2 +, no murmurs, rubs, gallops. Lungs: Clear to auscultation bilaterally. No wheezing, no rhonchi, no rales.  Abdomen: Soft, nontender, nondistended, positive bowel sounds. Extremities:  No clubbing or cyanosis, no pitting edema,  positive pedal pulses. Neuro: Grossly nonfocal.  Lab Results:  Basic Metabolic Panel:    Component Value Date/Time   NA 139 07/30/2011 0600   K 4.2 07/30/2011 0600   CL 97 07/30/2011 0600   CO2 30 07/30/2011 0600   BUN 28* 07/30/2011 0600   CREATININE 4.83* 07/30/2011 0600   GLUCOSE 84 07/30/2011 0600   CALCIUM 9.3 07/30/2011 0600   CBC:    Component Value Date/Time   WBC 5.4 07/30/2011 0600   HGB 10.0* 07/30/2011 0600   HCT 30.4* 07/30/2011 0600   PLT 267 07/30/2011 0600   MCV 92.4 07/30/2011 0600   NEUTROABS 5.6 07/29/2011 1531   LYMPHSABS 0.6* 07/29/2011 1531   MONOABS 0.6 07/29/2011 1531   EOSABS 0.0 07/29/2011 1531   BASOSABS 0.0 07/29/2011 1531      Lab 07/30/11 0600 07/29/11 1531  WBC 5.4 6.8  HGB 10.0* 10.4*  HCT 30.4* 31.5*  PLT 267 282  MCV 92.4 91.8  MCH 30.4 30.3  MCHC 32.9 33.0  RDW 13.4 13.5  LYMPHSABS -- 0.6*  MONOABS -- 0.6  EOSABS -- 0.0  BASOSABS -- 0.0  BANDABS -- --    Lab 07/30/11 0600 07/29/11 1531  NA 139 136  K 4.2 3.6  CL 97 94*  CO2 30 34*  GLUCOSE 84 116*  BUN 28*  19  CREATININE 4.83* 3.87*  CALCIUM 9.3 9.9  MG -- --    Lab 08/01/11 0525 07/31/11 0926 07/30/11 0600 07/29/11 1531  INR 3.03* 3.85* 3.94* 3.49*  PROTIME -- -- -- --   Cardiac markers: No results found for this basename: CK:3,CKMB:3,TROPONINI:3,MYOGLOBIN:3 in the last 168 hours No components found with this basename: POCBNP:3 No results found for this or any previous visit (from the past 240 hour(s)).  Studies/Results: No results found.  Medications: Scheduled Meds:   . amLODipine  10 mg Oral BID  . cloNIDine  0.1 mg Oral TID  . pantoprazole  40 mg Oral Q breakfast  . phytonadione (VITAMIN K) IV  10 mg Intravenous Once  . piperacillin-tazobactam (ZOSYN)  IV  2.25 g Intravenous Q8H  . sodium chloride  3 mL Intravenous Q12H   Continuous Infusions:  PRN Meds:.acetaminophen, acetaminophen, HYDROcodone-acetaminophen, HYDROmorphone, ondansetron  (ZOFRAN) IV, ondansetron    LOS: 3 days   Viktorya Arguijo 08/01/2011, 8:56 PM  [facility] Pager: [phone]

## 2011-08-01 NOTE — Progress Notes (Signed)
Patient seen and examined and agree with A&P per KO, PA. Hope to be able to do lap chole tomorrow. Discussed with her. I have discussed the indications for laparoscopic cholecystectomy with her.  We have discussed the risks of surgery, including general risks such as bleeding, infection, lung and heart issues etc. We have also discussed the potential for injuries to other organs, bile duct leaks, and other unexpected events. We have also talked about the fact that this may need to be converted to open under certain circumstances. We discussed the typical post op recovery and the fact that there is a good likelihood of improvement in symptoms and return to normal activity.  She understands this and wishes to proceed to schedule surgery. I believe all of her questions have been answered.

## 2011-08-01 NOTE — Progress Notes (Signed)
Utilization Review Completed.Maaz Spiering T2/04/2012   

## 2011-08-01 NOTE — Progress Notes (Signed)
ANTIBIOTIC CONSULT NOTE -follow up Pharmacy Consult for Zosyn Indication: r/o cholecystitis  No Known Allergies  Patient Measurements: Height: 5\' 2"  (157.5 cm) Weight: 136 lb 14.5 oz (62.1 kg) IBW/kg (Calculated) : 50.1    Vital Signs: Temp: 98.3 F (36.8 C) (02/11 0900) Temp src: Oral (02/11 0900) BP: 175/61 mmHg (02/11 0900) Pulse Rate: 54  (02/11 0900) Intake/Output from previous day: 02/10 0701 - 02/11 0700 In: 940 [P.O.:840; IV Piggyback:100] Out: 0  Intake/Output from this shift: Total I/O In: 240 [P.O.:240] Out: -   Labs:  Basename 07/30/11 0600 07/29/11 1531  WBC 5.4 6.8  HGB 10.0* 10.4*  PLT 267 282  LABCREA -- --  CREATININE 4.83* 3.87*   Estimated Creatinine Clearance: 8.1 ml/min (by C-G formula based on Cr of 4.83). Microbiology: No results found for this or any previous visit (from the past 720 hour(s)).   Assessment: Zosyn day # 4 Afebrile. No culture data available.  ESRD pt. Goal of Therapy:  Appropriate antibiotic therapy  Plan:   continue Zosyn 2.25 gm IV q8 hours.  F/u clinical course.  Len Childs T 08/01/2011,1:11 PM

## 2011-08-02 ENCOUNTER — Inpatient Hospital Stay (HOSPITAL_COMMUNITY): Payer: Medicare Other

## 2011-08-02 ENCOUNTER — Encounter (HOSPITAL_COMMUNITY): Payer: Self-pay | Admitting: Internal Medicine

## 2011-08-02 ENCOUNTER — Inpatient Hospital Stay (HOSPITAL_COMMUNITY): Payer: Medicare Other | Admitting: Anesthesiology

## 2011-08-02 ENCOUNTER — Encounter (HOSPITAL_COMMUNITY)
Admission: EM | Disposition: A | Discharge status: Home Health | Payer: Self-pay | Source: Home / Self Care | Attending: Internal Medicine

## 2011-08-02 ENCOUNTER — Encounter (HOSPITAL_COMMUNITY): Payer: Self-pay | Admitting: Anesthesiology

## 2011-08-02 DIAGNOSIS — G459 Transient cerebral ischemic attack, unspecified: Secondary | ICD-10-CM | POA: Diagnosis not present

## 2011-08-02 DIAGNOSIS — K8010 Calculus of gallbladder with chronic cholecystitis without obstruction: Secondary | ICD-10-CM

## 2011-08-02 HISTORY — PX: CHOLECYSTECTOMY: SHX55

## 2011-08-02 LAB — PROTIME-INR
INR: 1.34 (ref 0.00–1.49)
Prothrombin Time: 16.8 seconds — ABNORMAL HIGH (ref 11.6–15.2)

## 2011-08-02 SURGERY — LAPAROSCOPIC CHOLECYSTECTOMY WITH INTRAOPERATIVE CHOLANGIOGRAM
Anesthesia: General | Site: Abdomen | Wound class: Contaminated

## 2011-08-02 MED ORDER — PROPOFOL 10 MG/ML IV EMUL
INTRAVENOUS | Status: DC | PRN
  Administered 2011-08-02: 120 mg via INTRAVENOUS

## 2011-08-02 MED ORDER — ONDANSETRON HCL 4 MG/2ML IJ SOLN
INTRAMUSCULAR | Status: DC | PRN
  Administered 2011-08-02: 4 mg via INTRAVENOUS

## 2011-08-02 MED ORDER — BUPIVACAINE HCL (PF) 0.25 % IJ SOLN
INTRAMUSCULAR | Status: DC | PRN
  Administered 2011-08-02: 22 mL

## 2011-08-02 MED ORDER — IOHEXOL 300 MG/ML  SOLN
INTRAMUSCULAR | Status: DC | PRN
  Administered 2011-08-02: 10 mL via INTRAVENOUS

## 2011-08-02 MED ORDER — HEPARIN SODIUM (PORCINE) 1000 UNIT/ML DIALYSIS
20.0000 [IU]/kg | INTRAMUSCULAR | Status: DC | PRN
  Administered 2011-08-03: 1200 [IU] via INTRAVENOUS_CENTRAL

## 2011-08-02 MED ORDER — MORPHINE SULFATE 4 MG/ML IJ SOLN: 0.0500 mg/kg | INTRAMUSCULAR | Status: DC | PRN

## 2011-08-02 MED ORDER — ROCURONIUM BROMIDE 100 MG/10ML IV SOLN
INTRAVENOUS | Status: DC | PRN
  Administered 2011-08-02: 40 mg via INTRAVENOUS

## 2011-08-02 MED ORDER — GLYCOPYRROLATE 0.2 MG/ML IJ SOLN
INTRAMUSCULAR | Status: DC | PRN
  Administered 2011-08-02: .4 mg via INTRAVENOUS

## 2011-08-02 MED ORDER — PROMETHAZINE HCL 25 MG/ML IJ SOLN: 6.2500 mg | INTRAMUSCULAR | Status: DC | PRN

## 2011-08-02 MED ORDER — SODIUM CHLORIDE 0.9 % IV SOLN
INTRAVENOUS | Status: DC
  Administered 2011-08-02: 19:00:00 via INTRAVENOUS
  Administered 2011-08-03: 10 mL/h via INTRAVENOUS

## 2011-08-02 MED ORDER — HYDROMORPHONE HCL PF 1 MG/ML IJ SOLN
0.2500 mg | INTRAMUSCULAR | Status: DC | PRN
Start: 2011-08-02 — End: 2011-08-02

## 2011-08-02 MED ORDER — SODIUM CHLORIDE 0.9 % IV SOLN
INTRAVENOUS | Status: DC
  Administered 2011-08-02: 20 mL/h via INTRAVENOUS

## 2011-08-02 MED ORDER — SODIUM CHLORIDE 0.9 % IR SOLN
Status: DC | PRN
  Administered 2011-08-02: 1000 mL

## 2011-08-02 MED ORDER — FENTANYL CITRATE 0.05 MG/ML IJ SOLN
INTRAMUSCULAR | Status: DC | PRN
  Administered 2011-08-02: 25 ug via INTRAVENOUS
  Administered 2011-08-02: 50 ug via INTRAVENOUS
  Administered 2011-08-02: 150 ug via INTRAVENOUS

## 2011-08-02 MED ORDER — MEPERIDINE HCL 25 MG/ML IJ SOLN: 6.2500 mg | INTRAMUSCULAR | Status: DC | PRN

## 2011-08-02 MED ORDER — CHLORHEXIDINE GLUCONATE 4 % EX LIQD
1.0000 "application " | Freq: Once | CUTANEOUS | Status: DC
  Filled 2011-08-02: qty 15

## 2011-08-02 MED ORDER — SODIUM CHLORIDE 0.9 % IV SOLN
INTRAVENOUS | Status: DC | PRN
  Administered 2011-08-02: 15:00:00 via INTRAVENOUS

## 2011-08-02 MED ORDER — NEOSTIGMINE METHYLSULFATE 1 MG/ML IJ SOLN
INTRAMUSCULAR | Status: DC | PRN
  Administered 2011-08-02: 3 mg via INTRAVENOUS

## 2011-08-02 SURGICAL SUPPLY — 41 items
APPLIER CLIP ROT 10 11.4 M/L (STAPLE) ×2
BLADE SURG ROTATE 9660 (MISCELLANEOUS) IMPLANT
CANISTER SUCTION 2500CC (MISCELLANEOUS) ×2 IMPLANT
CHLORAPREP W/TINT 26ML (MISCELLANEOUS) ×2 IMPLANT
CLIP APPLIE ROT 10 11.4 M/L (STAPLE) ×1 IMPLANT
CLOTH BEACON ORANGE TIMEOUT ST (SAFETY) ×2 IMPLANT
COVER MAYO STAND STRL (DRAPES) ×2 IMPLANT
COVER SURGICAL LIGHT HANDLE (MISCELLANEOUS) ×2 IMPLANT
DECANTER SPIKE VIAL GLASS SM (MISCELLANEOUS) IMPLANT
DERMABOND ADVANCED (GAUZE/BANDAGES/DRESSINGS) ×1
DERMABOND ADVANCED .7 DNX12 (GAUZE/BANDAGES/DRESSINGS) ×1 IMPLANT
DRAPE C-ARM 42X72 X-RAY (DRAPES) ×2 IMPLANT
DRAPE UTILITY 15X26 W/TAPE STR (DRAPE) ×4 IMPLANT
ELECT REM PT RETURN 9FT ADLT (ELECTROSURGICAL) ×2
ELECTRODE REM PT RTRN 9FT ADLT (ELECTROSURGICAL) ×1 IMPLANT
FILTER SMOKE EVAC LAPAROSHD (FILTER) IMPLANT
GLOVE BIO SURGEON STRL SZ 6.5 (GLOVE) ×4 IMPLANT
GLOVE BIOGEL PI IND STRL 7.0 (GLOVE) ×2 IMPLANT
GLOVE BIOGEL PI INDICATOR 7.0 (GLOVE) ×2
GLOVE EUDERMIC 7 POWDERFREE (GLOVE) ×2 IMPLANT
GLOVE SURG SS PI 7.0 STRL IVOR (GLOVE) ×2 IMPLANT
GOWN PREVENTION PLUS XLARGE (GOWN DISPOSABLE) ×2 IMPLANT
GOWN STRL NON-REIN LRG LVL3 (GOWN DISPOSABLE) ×4 IMPLANT
KIT BASIN OR (CUSTOM PROCEDURE TRAY) ×2 IMPLANT
KIT ROOM TURNOVER OR (KITS) ×2 IMPLANT
NS IRRIG 1000ML POUR BTL (IV SOLUTION) ×2 IMPLANT
PAD ARMBOARD 7.5X6 YLW CONV (MISCELLANEOUS) ×4 IMPLANT
POUCH SPECIMEN RETRIEVAL 10MM (ENDOMECHANICALS) ×2 IMPLANT
SCISSORS LAP 5X35 DISP (ENDOMECHANICALS) ×2 IMPLANT
SET CHOLANGIOGRAPH 5 50 .035 (SET/KITS/TRAYS/PACK) ×2 IMPLANT
SET IRRIG TUBING LAPAROSCOPIC (IRRIGATION / IRRIGATOR) ×2 IMPLANT
SLEEVE Z-THREAD 5X100MM (TROCAR) ×2 IMPLANT
SPECIMEN JAR SMALL (MISCELLANEOUS) ×2 IMPLANT
SUT MNCRL AB 4-0 PS2 18 (SUTURE) ×2 IMPLANT
TOWEL OR 17X24 6PK STRL BLUE (TOWEL DISPOSABLE) ×2 IMPLANT
TOWEL OR 17X26 10 PK STRL BLUE (TOWEL DISPOSABLE) ×2 IMPLANT
TRAY LAPAROSCOPIC (CUSTOM PROCEDURE TRAY) ×2 IMPLANT
TROCAR XCEL BLUNT TIP 100MML (ENDOMECHANICALS) ×2 IMPLANT
TROCAR Z-THREAD FIOS 11X100 BL (TROCAR) ×2 IMPLANT
TROCAR Z-THREAD FIOS 5X100MM (TROCAR) ×4 IMPLANT
WATER STERILE IRR 1000ML POUR (IV SOLUTION) IMPLANT

## 2011-08-02 NOTE — Anesthesia Procedure Notes (Signed)
Procedure Name: Intubation Date/Time: 08/02/2011 3:15 PM Performed by: Delbert Harness Pre-anesthesia Checklist: Patient identified, Timeout performed, Emergency Drugs available, Suction available and Patient being monitored Patient Re-evaluated:Patient Re-evaluated prior to inductionOxygen Delivery Method: Circle System Utilized Preoxygenation: Pre-oxygenation with 100% oxygen Intubation Type: IV induction Ventilation: Mask ventilation without difficulty and Oral airway inserted - appropriate to patient size Laryngoscope Size: Mac and 3 Grade View: Grade I Tube type: Oral Tube size: 7.5 mm Number of attempts: 1 Airway Equipment and Method: stylet and LTA Placement Confirmation: ETT inserted through vocal cords under direct vision,  positive ETCO2 and breath sounds checked- equal and bilateral Secured at: 20 cm Tube secured with: Tape Dental Injury: Teeth and Oropharynx as per pre-operative assessment

## 2011-08-02 NOTE — Transfer of Care (Signed)
Immediate Anesthesia Transfer of Care Note  Patient: Judy Wolf  Procedure(s) Performed: Procedure(s) (LRB): LAPAROSCOPIC CHOLECYSTECTOMY WITH INTRAOPERATIVE CHOLANGIOGRAM (N/A)  Patient Location: PACU  Anesthesia Type: General  Level of Consciousness: awake  Airway & Oxygen Therapy: Patient Spontanous Breathing  Post-op Assessment: Report given to PACU RN  Post vital signs: stable  Complications: No apparent anesthesia complications

## 2011-08-02 NOTE — Progress Notes (Signed)
PT Cancellation Note  Treatment cancelled today due to patient receiving procedure or test.  Will try another time.    Sunny Schlein, [place] [phone] 08/02/2011, 2:31 PM

## 2011-08-02 NOTE — Op Note (Signed)
Judy Wolf 05-26-1929 [id number] 07/29/2011  Preoperative diagnosis: Chronic calculus cholecystitis with biliary colic  Postoperative diagnosis: Same  Procedure:Laparoscopic cholecystectomy with intraoperative glands gram  Surgeon: Currie Paris, MD, FACS  Assistant surgeon: Brayton El PA   Anesthesia:General  Clinical History and Indications: This patient has known gallstones and comes in today for cholecystectomy.  Description of procedure: The patient was seen in the preoperative area. I reviewed the plans for the procedure with her as well as the risks and complications. She had no further questions.  The patient was taken to the operating room. After satisfactory general endotracheal anesthesia had been obtained the abdomen was prepped and draped. A time out was done.  0.25% plain Marcaine was used at all incisions. I made an umbilical incision, identified the fascia and opened that, and entered the peritoneal cavity under direct vision. A 0 Vicryl pursestring suture was placed and the Hasson cannula was introduced under direct vision and secured with the pursestring. The abdomen was inflated to 15 cm.  The camera was placed and there were no gross abnormalities. The patient was then placed in reverse Trendelenburg and tilted to the left. A 10/11 trocar was placed in the epigastrium and two 5 mm trochars placed laterally all under direct vision.  At this point I placed the camera in the epigastric port because ofsome adhesions around the umbilical site. I freed this up so that we had free mobility of the umbilical port. There is no evidence of bowel injury.The camera was then placed in the umbilical port. The gallbladder was retracted over the liver. The peritoneum over the triangle of Calot was opened.Identified 2 branches of the cystic artery and clip on each. I identified a long segment of cystic duct it had a nice window behind it. I put a clip on it at the junction with  the gallbladder.  An intraoperative cholangiogram was then performed. A Cook catheter was introduced percutaneously and placed in the cystic duct. The cholangiogram showed good filling of the common duct and hepatic radicals and free flow into the duodenum. No abnormalities were noted.  The catheter was removed and 3 clips placed on the stay side of the cystic duct. The duct was then divided.  Additional clips are placed on the cystic artery and it was divided. The gallbladder was then removed from below to above the coagulation current of the cautery. It was then placed in a bag to be retrieved later.  The abdomen was irrigated and a check for hemostasis along the bed of the gallbladder made. Once everything appeared to be dry we were able to move the camera to the epigastric port and removed the gallbladder through the umbilical port.  The abdomen was reinsufflated and a final check for hemostasis made. There is no evidence of bleeding or bile leakage. The lateral ports were removed under direct vision and there was no bleeding. The umbilical site was closed with a pursestring, watching with the camera in the epigastric port. The abdomen was then deflated through the epigastric port and that was removed. Skin was closed with 4-0 Monocryl subcuticular and Dermabond.  The patient tolerated the procedure well. There were no operative complications. EBL was minimal. All counts were correct.  Currie Paris, MD, FACS 08/02/2011 4:18 PM

## 2011-08-02 NOTE — Anesthesia Postprocedure Evaluation (Signed)
  Anesthesia Post-op Note  Patient: Judy Wolf  Procedure(s) Performed: Procedure(s) (LRB): LAPAROSCOPIC CHOLECYSTECTOMY WITH INTRAOPERATIVE CHOLANGIOGRAM (N/A)  Patient Location: PACU  Anesthesia Type: General  Level of Consciousness: awake  Airway and Oxygen Therapy: Patient Spontanous Breathing and Patient connected to nasal cannula oxygen  Post-op Pain: mild  Post-op Assessment: Post-op Vital signs reviewed, Patient's Cardiovascular Status Stable, Respiratory Function Stable, Patent Airway and No signs of Nausea or vomiting  Post-op Vital Signs: Reviewed and stable  Complications: No apparent anesthesia complications

## 2011-08-02 NOTE — Progress Notes (Signed)
Reviewed plans with the patient and she wishes to proceed

## 2011-08-02 NOTE — Anesthesia Preprocedure Evaluation (Signed)
Anesthesia Evaluation  Patient identified by MRN, date of birth, ID band Patient awake    Reviewed: Allergy & Precautions, H&P , NPO status , Patient's Chart, lab work & pertinent test results  Airway       Dental   Pulmonary          Cardiovascular hypertension, Pt. on medications     Neuro/Psych TIA   GI/Hepatic Neg liver ROS,   Endo/Other  Diabetes mellitus-  Renal/GU Renal disease     Musculoskeletal   Abdominal   Peds  Hematology   Anesthesia Other Findings   Reproductive/Obstetrics                           Anesthesia Physical Anesthesia Plan  ASA: III  Anesthesia Plan: General   Post-op Pain Management:    Induction: Intravenous  Airway Management Planned: Oral ETT  Additional Equipment:   Intra-op Plan:   Post-operative Plan: Possible Post-op intubation/ventilation  Informed Consent: I have reviewed the patients History and Physical, chart, labs and discussed the procedure including the risks, benefits and alternatives for the proposed anesthesia with the patient or authorized representative who has indicated his/her understanding and acceptance.     Plan Discussed with: CRNA  Anesthesia Plan Comments:         Anesthesia Quick Evaluation

## 2011-08-02 NOTE — Transfer of Care (Signed)
Immediate Anesthesia Transfer of Care Note  Patient: Judy Wolf  Procedure(s) Performed: Procedure(s) (LRB): LAPAROSCOPIC CHOLECYSTECTOMY WITH INTRAOPERATIVE CHOLANGIOGRAM (N/A)  Patient Location: PACU  Anesthesia Type: General  Level of Consciousness: sedated, patient cooperative and responds to stimulation  Airway & Oxygen Therapy: Patient Spontanous Breathing and Patient connected to nasal cannula oxygen  Post-op Assessment: Report given to PACU RN and Post -op Vital signs reviewed and stable  Post vital signs: Reviewed and stable  Complications: No apparent anesthesia complications

## 2011-08-02 NOTE — Progress Notes (Signed)
Patient ID: Judy Wolf, female   DOB: [birth year], 78 y.o.   MRN: [id number]    Subjective: Pt feels better today.  No abdominal pain this morning.  Objective: Vital signs in last 24 hours: Temp:  [97.6 F (36.4 C)-98.5 F (36.9 C)] 98.2 F (36.8 C) (02/12 0600) Pulse Rate:  [54-99] 75  (02/12 0600) Resp:  [12-20] 18  (02/12 0600) BP: (144-216)/(61-93) 185/72 mmHg (02/12 0600) SpO2:  [94 %-99 %] 95 % (02/12 0600) Weight:  [128 lb 15.5 oz (58.5 kg)-131 lb 9.8 oz (59.7 kg)] 128 lb 15.5 oz (58.5 kg) (02/12 0045) Last BM Date: 08/01/11  Intake/Output from previous day: 02/11 0701 - 02/12 0700 In: 723 [P.O.:720; I.V.:3] Out: 2218  Intake/Output this shift:    PE: Abd: soft, NT, ND, +BS  Lab Results:   [facility] 08/01/11 2135  WBC 5.1  HGB 10.5*  HCT 31.0*  PLT 310   BMET  Basename 08/01/11 2135  NA 139  K 3.9  CL 98  CO2 24  GLUCOSE 99  BUN 44*  CREATININE 7.48*  CALCIUM 9.1   PT/INR  Basename 08/01/11 0525 07/31/11 0926  LABPROT 31.9* 38.4*  INR 3.03* 3.85*     Studies/Results: No results found.  Anti-infectives: Anti-infectives     Start     Dose/Rate Route Frequency Ordered Stop   07/29/11 2200  piperacillin-tazobactam (ZOSYN) IVPB 2.25 g       2.25 g 100 mL/hr over 30 Minutes Intravenous 3 times per day 07/29/11 2124             Assessment/Plan  1. Symptomatic cholelithiasis  Plan: 1. For OR today IF INR is around 1.5.  If not, then will likely give another dose of Vit K.   LOS: 4 days    [place] E 08/02/2011

## 2011-08-02 NOTE — Progress Notes (Signed)
Subjective:  Feels a little better.  3 kg off with HD yest, no BP drop.  No new complaints, up in chair today.  Objective:    Vital signs in last 24 hours: Filed Vitals:   08/01/11 2324 08/02/11 0045 08/02/11 0600 08/02/11 0944  BP: 197/93 170/61 185/72 142/62  Pulse: 99 81 75 71  Temp: 97.9 F (36.6 C) 98.5 F (36.9 C) 98.2 F (36.8 C) 98.5 F (36.9 C)  TempSrc: Oral Oral Oral Oral  Resp: 14 18 18 17   Height:      Weight:  58.5 kg (128 lb 15.5 oz)    SpO2: 97% 95% 95% 92%   Weight change: -2.4 kg (-5 lb 4.7 oz)  Intake/Output Summary (Last 24 hours) at 08/02/11 1331 Last data filed at 08/02/11 1100  Gross per 24 hour  Intake    513 ml  Output   2218 ml  Net  -1705 ml   Labs: Basic Metabolic Panel:  Lab 08/01/11 1610 07/30/11 0600 07/29/11 1531  NA 139 139 136  K 3.9 4.2 3.6  CL 98 97 94*  CO2 24 30 34*  GLUCOSE 99 84 116*  BUN 44* 28* 19  CREATININE 7.48* 4.83* 3.87*  ALB -- -- --  CALCIUM 9.1 9.3 9.9  PHOS 6.4* -- --   Liver Function Tests:  Lab 08/01/11 2135 07/30/11 0600 07/29/11 1531  AST -- 28 32  ALT -- 32 41*  ALKPHOS -- 48 53  BILITOT -- 0.4 0.4  PROT -- 6.0 6.9  ALBUMIN 3.2* 2.9* 3.5    Lab 07/29/11 1531  LIPASE 21  AMYLASE --   No results found for this basename: AMMONIA:3 in the last 168 hours CBC:  Lab 08/01/11 2135 07/30/11 0600 07/29/11 1531  WBC 5.1 5.4 6.8  NEUTROABS -- -- 5.6  HGB 10.5* 10.0* 10.4*  HCT 31.0* 30.4* 31.5*  MCV 88.3 92.4 91.8  PLT 310 267 282   Cardiac Enzymes: No results found for this basename: CKTOTAL:5,CKMB:5,CKMBINDEX:5,TROPONINI:5 in the last 168 hours CBG: No results found for this basename: GLUCAP:5 in the last 168 hours  Iron Studies: No results found for this basename: IRON:30,TIBC:30,SATURATION RATIOS:30,TRANSFERRIN:30,FERRITIN:30 in the last 168 hours Studies/Results: No results found. Medications:      . amLODipine  10 mg Oral BID  . cloNIDine  0.1 mg Oral TID  . pantoprazole  40 mg Oral Q  breakfast  . piperacillin-tazobactam (ZOSYN)  IV  2.25 g Intravenous Q8H  . sodium chloride  3 mL Intravenous Q12H    I  have reviewed scheduled and prn medications.  Physical Exam:  Blood pressure 142/62, pulse 71, temperature 98.5 F (36.9 C), temperature source Oral, resp. rate 17, height 5\' 2"  (1.575 m), weight 58.5 kg (128 lb 15.5 oz), SpO2 92.00%.  General:alert, black female, NAD, appropriate, thin  HEENT:[place], mmm  Eyes:eomi  Neck:supple, no jvd  Heart:RRR, 2/6 SEM LLSB, no rub  Lungs: CTA  Abdomen:Soft, nt, bs+  Extremities:no pedal edema  Skin:warm, no overt ulcers seen  Neuro:ox3, no acute deficits  Dialysis Access:pos. Bruit left fa avf   Outpatient Dialysis Orders: Center: [facility] on mwf .  EDW 58.5 HD Bath 2k, 2.5 ca Time 3.5 hrs Heparin tight. Access left forearm avf BFR 400 DFR 600  Zemplar 3 mcg IV/HD Epogen 2400 Units IV/HD Venofer 0  Other 0  Assessment/Plan:  1. Abd pain/N/V with cholelithiasis - per ccs, for surgery today (INR 1.3).  2. ESRD -MWF [facility], tight hep. HD tomorrow. 3.  Hypertension/volume - hd and norvasc/ clonidine. 3kg off yest with HD, down to dry wt today. 4. Anemia - epogen on hd, , check hgb pre hd  5. Metabolic bone disease - zemplar on hd , phoslo binder 6. TIA / hx pe and dvt on coumadin - s/p INR reversal, INR 1.3 today  Vinson Moselle  MD [facility] [phone] pgr    [phone] cell 08/02/2011, 1:31 PM

## 2011-08-02 NOTE — Progress Notes (Signed)
Patient ID: Judy Wolf, female   DOB: [birth year], 78 y.o.   MRN: [id number]  BRIEF HPI AND COURSE OF EVENTS: 78 year old female with  end-stage renal disease on hemodialysis (Mon, Wed, Fri), hypertension, history of cerebral aneurysm, TIA, PE, PVD, HTN, DM, CVA, on Coumadin, presented  with right upper quadrant abdominal pain associated with nausea and vomiting. Patient  had a prior CT scan in [facility] which showed gallstones and thickened gallbladder wall. Patient was evaluated by general surgery, Dr. Lindie Spruce, who felt that patient does not have evidence of acute cholecystitis on recent ultrasound, however patient does have abdominal pain with fevers, known gallstones on recent ultrasound, so they have recommended IV antibiotics, HIDA scan, holding the Coumadin for lap. Chole. HIDA scan was done and no evidence of cholecystitis.  Assessment/Plan:  Principal Problem:   *Cholelithiasis/nausea and vomiting/ abdominal pain:  - since INR is less than 1.5 plan for surgery  today - continue zosyn  - surgery is following   Active Problems:   ESRD (end stage renal disease) on dialysis - Patient is on hemodialysis Monday, Wednesday, Friday   TIA (transient ischemic attack):  - coumadin as per pharmacy  - hold coumadin as plan is for lap chole   HTN (hypertension) - Continue home medications, amlodipine and clonidine   Diabetes mellitus:  - sliding scale insulin   EDUCATION  - test results and diagnostic studies were discussed with patientat the bedside  - patient has verbalized the understanding  - questions were answered at the bedside and contact information was provided for additional questions or concerns    Subjective: No events overnight. Patient denies chest pain, shortness of breath, abdominal pain. Had bowel movement and reports ambulating.  Objective:  Vital signs in last 24 hours:  Filed Vitals:   08/02/11 0045 08/02/11 0600 08/02/11 0944 08/02/11 1344  BP:  170/61 185/72 142/62 145/62  Pulse: 81 75 71 67  Temp: 98.5 F (36.9 C) 98.2 F (36.8 C) 98.5 F (36.9 C) 98.7 F (37.1 C)  TempSrc: Oral Oral Oral Oral  Resp: 18 18 17 17   Height:      Weight: 58.5 kg (128 lb 15.5 oz)     SpO2: 95% 95% 92% 98%    Intake/Output from previous day:   Intake/Output Summary (Last 24 hours) at 08/02/11 1524 Last data filed at 08/02/11 1300  Gross per 24 hour  Intake     33 ml  Output   2218 ml  Net  -2185 ml    Physical Exam: General: Alert, awake, oriented x3, in no acute distress. HEENT: No bruits, no goiter. Moist mucous membranes, no scleral icterus, no conjunctival pallor. Heart: Regular rate and rhythm, S1/S2 +, no murmurs, rubs, gallops. Lungs: Clear to auscultation bilaterally. No wheezing, no rhonchi, no rales.  Abdomen: Soft, nontender, nondistended, positive bowel sounds. Extremities: No clubbing or cyanosis, no pitting edema,  positive pedal pulses. Neuro: Grossly nonfocal.  Lab Results:  Lab 08/01/11 2135 07/30/11 0600 07/29/11 1531  WBC 5.1 5.4 6.8  HGB 10.5* 10.0* 10.4*  HCT 31.0* 30.4* 31.5*  PLT 310 267 282  MCV 88.3 92.4 91.8    Lab 08/01/11 2135 07/30/11 0600 07/29/11 1531  NA 139 139 136  K 3.9 4.2 3.6  CL 98 97 94*  CO2 24 30 34*  GLUCOSE 99 84 116*  BUN 44* 28* 19  CREATININE 7.48* 4.83* 3.87*  CALCIUM 9.1 9.3 9.9    Lab 08/02/11 0602 08/01/11 0525 07/31/11 0926 07/30/11  0600 07/29/11 1531  INR 1.34 3.03* 3.85* 3.94* 3.49*  PROTIME -- -- -- -- --   Cardiac markers: No results found for this basename: CK:3,CKMB:3,TROPONINI:3,MYOGLOBIN:3 in the last 168 hours No components found with this basename: POCBNP:3 No results found for this or any previous visit (from the past 240 hour(s)).  Studies/Results: No results found.  Medications: Scheduled Meds:   . amLODipine  10 mg Oral BID  . cloNIDine  0.1 mg Oral TID  . pantoprazole  40 mg Oral Q breakfast  . piperacillin-tazobactam (ZOSYN)  IV  2.25 g  Intravenous Q8H  . sodium chloride  3 mL Intravenous Q12H   Continuous Infusions:   . sodium chloride 20 mL/hr (08/02/11 1416)   PRN Meds:.acetaminophen, acetaminophen, heparin, HYDROcodone-acetaminophen, HYDROmorphone, ondansetron (ZOFRAN) IV, ondansetron    LOS: 4 days   Adayah Arocho 08/02/2011, 3:24 PM  [facility] Pager: [phone]

## 2011-08-02 NOTE — Preoperative (Signed)
Beta Blockers   Reason not to administer Beta Blockers:Not Applicable 

## 2011-08-03 ENCOUNTER — Encounter (HOSPITAL_COMMUNITY): Payer: Self-pay | Admitting: Surgery

## 2011-08-03 ENCOUNTER — Inpatient Hospital Stay (HOSPITAL_COMMUNITY): Payer: Medicare Other

## 2011-08-03 ENCOUNTER — Other Ambulatory Visit (INDEPENDENT_AMBULATORY_CARE_PROVIDER_SITE_OTHER): Payer: Self-pay | Admitting: Surgery

## 2011-08-03 LAB — CBC
HCT: 29.7 % — ABNORMAL LOW (ref 36.0–46.0)
MCV: 89.2 fL (ref 78.0–100.0)
Platelets: 301 10*3/uL (ref 150–400)
RBC: 3.33 MIL/uL — ABNORMAL LOW (ref 3.87–5.11)
WBC: 8 10*3/uL (ref 4.0–10.5)

## 2011-08-03 LAB — RENAL FUNCTION PANEL
BUN: 29 mg/dL — ABNORMAL HIGH (ref 6–23)
CO2: 24 mEq/L (ref 19–32)
Chloride: 101 mEq/L (ref 96–112)
Creatinine, Ser: 6.7 mg/dL — ABNORMAL HIGH (ref 0.50–1.10)
GFR calc non Af Amer: 5 mL/min — ABNORMAL LOW (ref >90)

## 2011-08-03 MED ORDER — FLUCONAZOLE 100 MG PO TABS
100.0000 mg | ORAL_TABLET | Freq: Every day | ORAL | Status: DC
  Administered 2011-08-03 – 2011-08-05 (×3): 100 mg via ORAL
  Filled 2011-08-03 (×3): qty 1

## 2011-08-03 MED ORDER — HYDROMORPHONE HCL PF 1 MG/ML IJ SOLN
INTRAMUSCULAR | Status: AC
  Administered 2011-08-03: 1 mg via INTRAVENOUS
  Filled 2011-08-03: qty 1

## 2011-08-03 MED ORDER — DARBEPOETIN ALFA-POLYSORBATE 60 MCG/0.3ML IJ SOLN
60.0000 ug | INTRAMUSCULAR | Status: DC
  Administered 2011-08-03: 60 ug via INTRAVENOUS
  Filled 2011-08-03: qty 0.3

## 2011-08-03 MED ORDER — WARFARIN SODIUM 5 MG PO TABS
5.0000 mg | ORAL_TABLET | Freq: Once | ORAL | Status: AC
  Administered 2011-08-03: 5 mg via ORAL
  Filled 2011-08-03: qty 1

## 2011-08-03 MED ORDER — DARBEPOETIN ALFA-POLYSORBATE 60 MCG/0.3ML IJ SOLN
INTRAMUSCULAR | Status: AC
  Administered 2011-08-03: 60 ug via INTRAVENOUS
  Filled 2011-08-03: qty 0.3

## 2011-08-03 MED ORDER — ACETAMINOPHEN 325 MG PO TABS
ORAL_TABLET | ORAL | Status: AC
  Administered 2011-08-03: 650 mg via ORAL
  Filled 2011-08-03: qty 2

## 2011-08-03 NOTE — Progress Notes (Signed)
Subjective: Feel better, tolerated clears. Diet advanced today, by surgical team.  Objective: Vital signs in last 24 hours: Temp:  [97.7 F (36.5 C)-100.2 F (37.9 C)] 97.7 F (36.5 C) (02/13 1115) Pulse Rate:  [66-93] 79  (02/13 1115) Resp:  [14-20] 19  (02/13 1115) BP: (137-178)/(37-77) 160/71 mmHg (02/13 1115) SpO2:  [93 %-100 %] 99 % (02/13 1115) FiO2 (%):  [2 %] 2 % (02/12 1756) Weight:  [56.2 kg (123 lb 14.4 oz)-60 kg (132 lb 4.4 oz)] 56.2 kg (123 lb 14.4 oz) (02/13 1115) Weight change: 0.3 kg (10.6 oz) Last BM Date: 08/02/11  Intake/Output from previous day: 02/12 0701 - 02/13 0700 In: 648.7 [P.O.:30; I.V.:518.7; IV Piggyback:100] Out: 15 [Blood:15] Total I/O In: -  Out: 1462 [Other:1462]   Physical Exam: General: Comfortable, alert, communicative, fully oriented, not short of breath at rest.  HEENT:  Mild clinical pallor, no jaundice, no conjunctival injection or discharge. Hydration status is fair, has oral thrush. NECK:  Supple, JVP not seen, no carotid bruits, no palpable lymphadenopathy, no palpable goiter. CHEST:  Clinically clear to auscultation, no wheezes, no crackles. HEART:  Sounds 1 and 2 heard, normal, regular, no murmurs. ABDOMEN:  Full, soft, non-tender, no palpable organomegaly, no palpable masses, normal bowel sounds. GENITALIA:  Not examined. LOWER EXTREMITIES:  No pitting edema, palpable peripheral pulses. MUSCULOSKELETAL SYSTEM:  Generalized osteoarthritic changes, otherwise, normal. CENTRAL NERVOUS SYSTEM:  No focal neurologic deficit on gross examination.  Lab Results:  Basename 08/03/11 0810 08/01/11 2135  WBC 8.0 5.1  HGB 9.9* 10.5*  HCT 29.7* 31.0*  PLT 301 310    Basename 08/03/11 0810 08/01/11 2135  NA 141 139  K 3.6 3.9  CL 101 98  CO2 24 24  GLUCOSE 104* 99  BUN 29* 44*  CREATININE 6.70* 7.48*  CALCIUM 8.9 9.1   No results found for this or any previous visit (from the past 240 hour(s)).   Studies/Results: Dg  Cholangiogram Operative  08/02/2011  *RADIOLOGY REPORT*  Clinical Data:   Cholelithiasis  INTRAOPERATIVE CHOLANGIOGRAM  Technique:  Cholangiographic image from the C-arm fluoroscopic device   submitted for interpretation post-operatively.  Please see the procedural report for the amount of contrast and the fluoroscopy time utilized.  Comparison:  None  Findings:  No persistent filling defects in the common duct. Intrahepatic ducts are incompletely visualized, appearing decompressed centrally. Contrast passes into the duodenum.  IMPRESSION  Negative for retained common duct stone.  Original Report Authenticated By: Osa Craver, M.D.   Dg Chest Port 1 View  08/02/2011  *RADIOLOGY REPORT*  Clinical Data: Dialysis patient, postop  PORTABLE CHEST - 1 VIEW  Comparison: Chest x-ray of 04/04/2008  Findings: The lungs are hyperaerated.  There is cardiomegaly present and there may be mild pulmonary vascular congestion present.  No acute bony abnormality is seen, with degenerative change in the thoracic spine.  IMPRESSION: Cardiomegaly.  Possible mild pulmonary vascular congestion. Hyperaeration.  Original Report Authenticated By: Juline Patch, M.D.    Medications: Scheduled Meds:   . amLODipine  10 mg Oral BID  . chlorhexidine  1 application Topical Once  . cloNIDine  0.1 mg Oral TID  . darbepoetin (ARANESP) injection - DIALYSIS  60 mcg Intravenous Q Wed-HD  . pantoprazole  40 mg Oral Q breakfast  . piperacillin-tazobactam (ZOSYN)  IV  2.25 g Intravenous Q8H  . sodium chloride  3 mL Intravenous Q12H   Continuous Infusions:   . sodium chloride 10 mL/hr (08/03/11 0626)  .  DISCONTD: sodium chloride 20 mL/hr (08/02/11 1416)   PRN Meds:.acetaminophen, acetaminophen, heparin, HYDROcodone-acetaminophen, HYDROmorphone, ondansetron (ZOFRAN) IV, ondansetron, DISCONTD: bupivacaine, DISCONTD: HYDROmorphone, DISCONTD: iohexol, DISCONTD: meperidine, DISCONTD: morphine, DISCONTD: promethazine, DISCONTD:  sodium chloride irrigation  Assessment/Plan:  Principal Problem:  *Abdominal pain: Due to gall bladder disease. See below. Active Problems:  1. ESRD (end stage renal disease) on dialysis: Managed by the nephrology team. Patient is on hemodialysis Monday, Wednesday, Friday. Clinically stable.  2. Diabetes mellitus: Controlled on diet.   3. Symptomatic cholelithiasis/Chronic calculous cholecystitis: CT scan in [facility],  showed gallstones and thickened gallbladder wall. Abdominal U/S of 07/29/11, showed several tiny gallstones and gallbladder sludge. No evidence of gallbladder wall thickening or biliary dilatation Hida scan of 07/30/11, showed no evidence of cholecystitis with gallbladder visualized at 20 minutes. No cystic duct obstruction. No CBD obstruction. Bowel activity noted at 45 minutes. Surgical consultation was initally provided by [facility] Friday, and patient has since been on antibiotic cover, ie, Zosyn, now day# 6. She is status post laparoscopic cholecystectomy on 08/02/11, performed by Dr Jamey Ripa, and is feeling quite comfortable today.   4. HTN (hypertension): Uncontrolled: Will continue Clonidine and Amlodipine, and observe. Clonidine may need further adjustment.  5. TIA (transient ischemic attack): Stable, no recurrence.  6. Anticoagulation: Per Surgical team, this may now be restarted.  7. Oral thrush: Will manage with a seven day course of diflucan.  Comment; PT/OT. Aim discharge next few days.    LOS: 5 days   Magaly Pollina,CHRISTOPHER 08/03/2011, 12:15 PM

## 2011-08-03 NOTE — Progress Notes (Signed)
Anticoagulation: per h&p from last year, history of PE, history of cva, not clear when patient was placed on warfarin - INR 3.49 on admit (2/8) and 3.94 2/9. Coumadin has been on hold for cholecystectomy (which occurred on 02/12) Today INR 1.22.  Patient was on coumadin 7.5mg  po qday, except 5mg  on Tuesdays (admitting INR 3.49)  Plan: coumadin 5mg  po x1 today; INR in am  Infectious Disease: Zosyn Day 6 for cholecystitis. Tmax 99, WBC 8; no micro, UA neg Plan: Cont zosyn 2.25g iv q8h

## 2011-08-03 NOTE — Discharge Instructions (Addendum)
[facility] ______[facility] °LAPAROSCOPIC SURGERY: POST OP INSTRUCTIONS °Always review your discharge instruction sheet given to you by the facility where your surgery was performed. °IF YOU HAVE DISABILITY OR FAMILY LEAVE FORMS, YOU MUST BRING THEM TO THE OFFICE FOR PROCESSING.   °DO NOT GIVE THEM TO YOUR DOCTOR. ° °1. A prescription for pain medication may be given to you upon discharge.  Take your pain medication as prescribed, if needed.  If narcotic pain medicine is not needed, then you may take acetaminophen (Tylenol) or ibuprofen (Advil) as needed. °2. Take your usually prescribed medications unless otherwise directed. °3. If you need a refill on your pain medication, please contact your pharmacy.  They will contact our office to request authorization. Prescriptions will not be filled after 5pm or on week-ends. °4. You should follow a light diet the first few days after arrival home, such as soup and crackers, etc.  Be sure to include lots of fluids daily. °5. Most patients will experience some swelling and bruising in the area of the incisions.  Ice packs will help.  Swelling and bruising can take several days to resolve.  °6. It is common to experience some constipation if taking pain medication after surgery.  Increasing fluid intake and taking a stool softener (such as Colace) will usually help or prevent this problem from occurring.  A mild laxative (Milk of Magnesia or Miralax) should be taken according to package instructions if there are no bowel movements after 48 hours. °7. Unless discharge instructions indicate otherwise, you may remove your bandages 24-48 hours after surgery, and you may shower at that time.  You may have steri-strips (small skin tapes) in place directly over the incision.  These strips should be left on the skin for 7-10 days.  If your surgeon used skin glue on the incision, you may shower in 24 hours.  The glue will flake off over the next 2-3 weeks.  Any sutures or  staples will be removed at the office during your follow-up visit. °8. ACTIVITIES:  You may resume regular (light) daily activities beginning the next day--such as daily self-care, walking, climbing stairs--gradually increasing activities as tolerated.  You may have sexual intercourse when it is comfortable.  Refrain from any heavy lifting or straining until approved by your doctor. °a. You may drive when you are no longer taking prescription pain medication, you can comfortably wear a seatbelt, and you can safely maneuver your car and apply brakes. °b. RETURN TO WORK:  __________________________________________________________ °9. You should see your doctor in the office for a follow-up appointment approximately 2-3 weeks after your surgery.  Make sure that you call for this appointment within a day or two after you arrive home to insure a convenient appointment time. °10. OTHER INSTRUCTIONS: __________________________________________________________________________________________________________________________ __________________________________________________________________________________________________________________________ °WHEN TO CALL YOUR DOCTOR: °1. Fever over 101.0 °2. Inability to urinate °3. Continued bleeding from incision. °4. Increased pain, redness, or drainage from the incision. °5. Increasing abdominal pain ° °The clinic staff is available to answer your questions during regular business hours.  Please don’t hesitate to call and ask to speak to one of the nurses for clinical concerns.  If you have a medical emergency, go to the nearest emergency room or call 911.  A surgeon from [facility] is always on call at the hospital. °[address], [address], [place], [place]  [address] ? [address] [address], [place], [place]   [address] °[phone] ? [phone] ? FAX [phone] °Web site:   [email or web address] °

## 2011-08-03 NOTE — Progress Notes (Signed)
1 Day Post-Op  Subjective: Pt seen in HD. Sore but tolerable. No N/V but has only had few sips of clears.  Objective: Vital signs in last 24 hours: Temp:  [98.1 F (36.7 C)-100.2 F (37.9 C)] 100.2 F (37.9 C) (02/13 0745) Pulse Rate:  [66-93] 66  (02/13 0843) Resp:  [16-18] 16  (02/13 0843) BP: (141-178)/(37-77) 141/64 mmHg (02/13 0843) SpO2:  [92 %-100 %] 97 % (02/13 0745) FiO2 (%):  [2 %] 2 % (02/12 1756) Weight:  [57.6 kg (126 lb 15.8 oz)-60 kg (132 lb 4.4 oz)] 57.6 kg (126 lb 15.8 oz) (02/13 0745) Last BM Date: 08/02/11  Intake/Output this shift:    Physical Exam: BP 141/64  Pulse 66  Temp(Src) 100.2 F (37.9 C) (Oral)  Resp 16  Ht 5\' 2"  (1.575 m)  Wt 57.6 kg (126 lb 15.8 oz)  BMI 23.23 kg/m2  SpO2 97% Lungs: CTA without w/r/r Heart: Regular Abdomen: soft, ND, appropriately tender   Incisions all c/d/i without erythema or hematoma. Ext: No edema or tenderness   Labs: CBC  Basename 08/03/11 0810 08/01/11 2135  WBC 8.0 5.1  HGB 9.9* 10.5*  HCT 29.7* 31.0*  PLT 301 310   BMET  Basename 08/01/11 2135  NA 139  K 3.9  CL 98  CO2 24  GLUCOSE 99  BUN 44*  CREATININE 7.48*  CALCIUM 9.1   LFT  Basename 08/01/11 2135  PROT --  ALBUMIN 3.2*  AST --  ALT --  ALKPHOS --  BILITOT --  BILIDIR --  IBILI --  LIPASE --   PT/INR  Basename 08/02/11 0602 08/01/11 0525  LABPROT 16.8* 31.9*  INR 1.34 3.03*   ABG No results found for this basename: PHART:2,PCO2:2,PO2:2,HCO3:2 in the last 72 hours  Studies/Results: Dg Cholangiogram Operative  08/02/2011  *RADIOLOGY REPORT*  Clinical Data:   Cholelithiasis  INTRAOPERATIVE CHOLANGIOGRAM  Technique:  Cholangiographic image from the C-arm fluoroscopic device   submitted for interpretation post-operatively.  Please see the procedural report for the amount of contrast and the fluoroscopy time utilized.  Comparison:  None  Findings:  No persistent filling defects in the common duct. Intrahepatic ducts are  incompletely visualized, appearing decompressed centrally. Contrast passes into the duodenum.  IMPRESSION  Negative for retained common duct stone.  Original Report Authenticated By: Osa Craver, M.D.   Dg Chest Port 1 View  08/02/2011  *RADIOLOGY REPORT*  Clinical Data: Dialysis patient, postop  PORTABLE CHEST - 1 VIEW  Comparison: Chest x-ray of 04/04/2008  Findings: The lungs are hyperaerated.  There is cardiomegaly present and there may be mild pulmonary vascular congestion present.  No acute bony abnormality is seen, with degenerative change in the thoracic spine.  IMPRESSION: Cardiomegaly.  Possible mild pulmonary vascular congestion. Hyperaeration.  Original Report Authenticated By: Juline Patch, M.D.    Assessment: Principal Problem:  *Abdominal pain Active Problems:  ESRD (end stage renal disease) on dialysis  Diabetes mellitus  Symptomatic cholelithiasis  HTN (hypertension)  TIA (transient ischemic attack)   Procedure(s): LAPAROSCOPIC CHOLECYSTECTOMY WITH INTRAOPERATIVE CHOLANGIOGRAM  Plan: Advance diet after dialysis. Ok to restart COumadin  LOS: 5 days    Alyse Low 08/03/2011 8:53 AM

## 2011-08-03 NOTE — Progress Notes (Signed)
Physical Therapy Treatment Patient Details Name: SHIRON WHETSEL MRN: [id number] DOB: [birth year] Today's Date: 08/03/2011  PT Assessment/Plan  PT - Assessment/Plan Comments on Treatment Session: pt presents s/p Lap Chole.  pt fatigued from HD this am, but agreeable to mobility.  Shortly after pt entering hallway pt began to drip loose stool and needed to return to bathroom.  After hygiene pt too fatigued to try ambulating again.   PT Plan: Discharge plan remains appropriate;Frequency remains appropriate PT Frequency: Min 3X/week Recommendations for Other Services: Other (comment) (CM for A with Meals on Wheels) Follow Up Recommendations: Home health PT Equipment Recommended: Rolling walker with 5" wheels;3 in 1 bedside comode PT Goals  Acute Rehab PT Goals PT Goal: Sit to Stand - Progress: Progressing toward goal PT Goal: Stand to Sit - Progress: Progressing toward goal PT Goal: Ambulate - Progress: Progressing toward goal PT Goal: Up/Down Stairs - Progress: Not met  PT Treatment Precautions/Restrictions  Precautions Precaution Comments: None Required Braces or Orthoses: No Restrictions Weight Bearing Restrictions: No Mobility (including Balance) Bed Mobility Bed Mobility: Yes Supine to Sit: 6: Modified independent (Device/Increase time);With rails (needs increased time to complete.  ) Sitting - Scoot to Edge of Bed: 7: Independent Transfers Transfers: Yes Sit to Stand: 5: Supervision;With upper extremity assist;From bed;From toilet Sit to Stand Details (indicate cue type and reason): cues for use of UEs.   Stand to Sit: 5: Supervision;With upper extremity assist;To bed;To toilet Stand to Sit Details: cues to get closer prior to sitting.   Ambulation/Gait Ambulation/Gait: Yes Ambulation/Gait Assistance: 5: Supervision Ambulation/Gait Assistance Details (indicate cue type and reason): Ambulation limited by pt with loose BM in hallway.  Returned to bathroom.   Ambulation Distance  (Feet): 25 Feet (25,20) Assistive device: Rolling walker Gait Pattern: Step-through pattern;Trunk flexed Stairs: No Wheelchair Mobility Wheelchair Mobility: No  Posture/Postural Control Posture/Postural Control: No significant limitations Balance Balance Assessed: No Exercise    End of Session PT - End of Session Equipment Utilized During Treatment: Gait belt Activity Tolerance: Patient limited by fatigue (Limited by loose BM) Patient left: in bed;with call bell in reach Nurse Communication: Mobility status for transfers;Mobility status for ambulation General Behavior During Session: [facility] for tasks performed Cognition: [facility] for tasks performed  Sunny Schlein, [place] [phone] 08/03/2011, 2:39 PM

## 2011-08-03 NOTE — Progress Notes (Signed)
PT Cancellation Note  Treatment cancelled today due to patient receiving procedure or test.  Pt in HD.  Will try another time.     Sunny Schlein, [place] [phone] 08/03/2011, 10:21 AM

## 2011-08-03 NOTE — Progress Notes (Signed)
Subjective:   Seen on HD, abdominal soreness s/p surgery yesterday, but no other complaints  Objective: Vital signs in last 24 hours: Temp:  [98.1 F (36.7 C)-100.2 F (37.9 C)] 100.2 F (37.9 C) (02/13 0745) Pulse Rate:  [67-93] 75  (02/13 0810) Resp:  [16-18] 16  (02/13 0810) BP: (142-178)/(37-77) 178/76 mmHg (02/13 0810) SpO2:  [92 %-100 %] 97 % (02/13 0745) FiO2 (%):  [2 %] 2 % (02/12 1756) Weight:  [57.6 kg (126 lb 15.8 oz)-60 kg (132 lb 4.4 oz)] 57.6 kg (126 lb 15.8 oz) (02/13 0745) Weight change: 0.3 kg (10.6 oz)  Intake/Output from previous day: 02/12 0701 - 02/13 0700 In: 648.7 [P.O.:30; I.V.:518.7; IV Piggyback:100] Out: 15 [Blood:15]   EXAM: General appearance:  Alert, comfortable, in no apparent distress Resp:  CTA bilaterally Cardio:  RRR with Gr II/VI systolic murmur GI:  + BS, soft with mild general tenderness Extremities:  No edema Access:  AVF @ LFA, on HD  Lab Results:  [facility] 08/03/11 0810 08/01/11 2135  WBC 8.0 5.1  HGB 9.9* 10.5*  HCT 29.7* 31.0*  PLT 301 310   BMET:  Basename 08/01/11 2135  NA 139  K 3.9  CL 98  CO2 24  GLUCOSE 99  BUN 44*  CREATININE 7.48*  CALCIUM 9.1  ALBUMIN 3.2*   No results found for this basename: PTH:2 in the last 72 hours Iron Studies: No results found for this basename: IRON,TIBC,TRANSFERRIN,FERRITIN in the last 72 hours  Assessment/Plan: 1.  Abd pain/N/V with cholelithiasis - s/p laparoscopic cholecystectomy per Dr. Jamey Ripa yesterday, stable. 2.  ESRD - MWF [place], tight hep, stable on HD today, K 3.9. 3.  Hypertension/volume - BP stable with hd and norvasc/ clonidine, down to dry wt, UF goal of 2 L today.  4.  Anemia - epogen on HD as outpatient, Hgb 9.9 pre-HD (down from 10.5).  Aranesp 60 mg IV today.   5.  Metabolic bone disease - Zemplar 3 mcg on hd,  phoslo binder. 6.  TIA / hx pe and dvt on coumadin - s/p INR reversal, INR 1.3 yesterday.     LOS: 5 days   LYLES,CHARLES 08/03/2011,8:36  AM  Patient seen and examined and agree with assessment and plan as above.   Vinson Moselle  MD [employer] [phone] pgr    [phone] cell 08/03/2011, 5:18 PM

## 2011-08-03 NOTE — Progress Notes (Signed)
Seen in dialysis - comfortable seems to be doing fine. Abdomen is soft and benign. Plan per KB,PA

## 2011-08-04 DIAGNOSIS — B370 Candidal stomatitis: Secondary | ICD-10-CM | POA: Diagnosis not present

## 2011-08-04 LAB — CBC
HCT: 30.9 % — ABNORMAL LOW (ref 36.0–46.0)
Hemoglobin: 10.1 g/dL — ABNORMAL LOW (ref 12.0–15.0)
MCH: 29.8 pg (ref 26.0–34.0)
MCHC: 32.7 g/dL (ref 30.0–36.0)
MCV: 91.2 fL (ref 78.0–100.0)
Platelets: 274 K/uL (ref 150–400)
RBC: 3.39 MIL/uL — ABNORMAL LOW (ref 3.87–5.11)
RDW: 13 % (ref 11.5–15.5)
WBC: 6.3 K/uL (ref 4.0–10.5)

## 2011-08-04 LAB — RENAL FUNCTION PANEL
Albumin: 2.7 g/dL — ABNORMAL LOW (ref 3.5–5.2)
BUN: 20 mg/dL (ref 6–23)
CO2: 26 meq/L (ref 19–32)
Calcium: 8.9 mg/dL (ref 8.4–10.5)
Chloride: 103 meq/L (ref 96–112)
Creatinine, Ser: 5.35 mg/dL — ABNORMAL HIGH (ref 0.50–1.10)
GFR calc Af Amer: 8 mL/min — ABNORMAL LOW
GFR calc non Af Amer: 7 mL/min — ABNORMAL LOW
Glucose, Bld: 138 mg/dL — ABNORMAL HIGH (ref 70–99)
Phosphorus: 3.2 mg/dL (ref 2.3–4.6)
Potassium: 3.6 meq/L (ref 3.5–5.1)
Sodium: 140 meq/L (ref 135–145)

## 2011-08-04 LAB — PROTIME-INR
INR: 1.11 (ref 0.00–1.49)
Prothrombin Time: 14.5 s (ref 11.6–15.2)

## 2011-08-04 LAB — CLOSTRIDIUM DIFFICILE BY PCR: Toxigenic C. Difficile by PCR: NEGATIVE

## 2011-08-04 MED ORDER — WARFARIN SODIUM 10 MG PO TABS
10.0000 mg | ORAL_TABLET | Freq: Once | ORAL | Status: AC
  Administered 2011-08-04: 10 mg via ORAL
  Filled 2011-08-04: qty 1

## 2011-08-04 MED ORDER — HEPARIN SODIUM (PORCINE) 1000 UNIT/ML DIALYSIS
20.0000 [IU]/kg | INTRAMUSCULAR | Status: DC | PRN
  Administered 2011-08-05: 1200 [IU] via INTRAVENOUS_CENTRAL
  Filled 2011-08-04: qty 2

## 2011-08-04 MED ORDER — HEPARIN SODIUM (PORCINE) 1000 UNIT/ML DIALYSIS
20.0000 [IU]/kg | INTRAMUSCULAR | Status: DC | PRN
  Filled 2011-08-04: qty 2

## 2011-08-04 NOTE — Consult Note (Signed)
Judy Wolf 08/04/2011 Judy Wolf Requesting Physician:  Dr. Elisabeth Pigeon  Reason for Consult:  Need for dialysis and related services in ESRD patient with abd pain and gallstones.  HPI:  The patient is an 78 year old female with end-stage renal disease on hemodialysis (Mon, Wed, Fri), hypertension, history of cerebral aneurysm, TIA/CVA, PE, on Coumadin, presented with right upper quadrant abdominal pain associated with nausea and vomiting.  Patient had a prior CT scan in [facility] which showed gallstones and thickened gallbladder wall. Patient was evaluated by general surgery and they have recommended IV antibiotics, HIDA scan, holding the Coumadin for lap. Chole. HIDA scan was done and no evidence of cholecystitis. Neph service asked to see patient for hemodialysis and related needs.  Patient is uncomfortable with abd pain.  Denies SOB, CP or leg swelling. Due for HD today.  Outpatient Dialysis Orders: Center: [facility] on mwf . EDW 58.5 HD Bath 2k, 2.5 ca Time 3.5 hrs Heparin tight. Access left forearm avf BFR 400 DFR 600  Zemplar 3 mcg IV/HD Epogen 2400 Units IV/HD Venofer 0 Other 0   Past Medical History:  Past Medical History  Diagnosis Date  . Hypertension   . Renal disorder   . TIA (transient ischemic attack)     Past Surgical History:  Past Surgical History  Procedure Date  . Cholecystectomy 08/02/2011    Procedure: LAPAROSCOPIC CHOLECYSTECTOMY WITH INTRAOPERATIVE CHOLANGIOGRAM;  Surgeon: Currie Paris, MD;  Location: [facility] OR;  Service: General;  Laterality: N/A;    Family History: History reviewed. No pertinent family history. Social History:  reports that she has never smoked. She does not have any smokeless tobacco history on file. She reports that she does not drink alcohol or use illicit drugs.  Allergies: No Known Allergies  Home medications: Prior to Admission medications   Medication Sig Start Date End Date Taking? Authorizing Provider  amLODipine  (NORVASC) 10 MG tablet Take 10 mg by mouth 2 (two) times daily.   Yes Historical Provider, MD  cloNIDine (CATAPRES) 0.1 MG tablet Take 0.1 mg by mouth 3 (three) times daily.   Yes Historical Provider, MD  HYDROcodone-acetaminophen (VICODIN) 5-500 MG per tablet Take 1 tablet by mouth daily as needed. pain   Yes Historical Provider, MD  warfarin (COUMADIN) 5 MG tablet Take 5 mg by mouth daily. 1 tablet on Tuesday, 1 and 1/2 tablet all other days.   Yes Historical Provider, MD    Inpatient medications:    . amLODipine  10 mg Oral BID  . chlorhexidine  1 application Topical Once  . cloNIDine  0.1 mg Oral TID  . darbepoetin (ARANESP) injection - DIALYSIS  60 mcg Intravenous Q Wed-HD  . fluconazole  100 mg Oral Daily  . pantoprazole  40 mg Oral Q breakfast  . sodium chloride  3 mL Intravenous Q12H  . warfarin  10 mg Oral ONCE-1800  . warfarin  5 mg Oral ONCE-1800  . DISCONTD: piperacillin-tazobactam (ZOSYN)  IV  2.25 g Intravenous Q8H    Review of Systems Gen:  Denies headache, fever, chills, sweats.  No weight loss. HEENT:  No visual change, sore throat, difficulty swallowing. Resp:  No difficulty breathing, DOE.  No cough or hemoptysis. Cardiac:  No chest pain, orthopnea, PND.  Denies edema. GI:   See above. GU:  Denies difficulty or change in voiding.     MS:  Denies joint pain or swelling.   Derm:  Denies skin rash or itching.  No chronic skin conditions.  Neuro:  Denies focal weakness, memory problems.   Psych:  Denies symptoms of depression of anxiety.  No hallucination.    Physical Exam:  Blood pressure 109/64, pulse 58, temperature 97.9 F (36.6 C), temperature source Oral, resp. rate 20, height 5\' 2"  (1.575 m), weight 58.1 kg (128 lb 1.4 oz), SpO2 99.00%.  General:alert, black female, NAD, appropriate, thin  HEENT:Peters, mmm  Eyes:eomi  Neck:supple, no jvd  Heart:RRR, 2/6 SEM LLSB, no rub  Lungs: CTA  Abdomen:Soft, nt, bs+  Extremities:no pedal edema  Skin:warm, no overt  ulcers seen  Neuro:ox3, no acute deficits  Dialysis Access:pos. Bruit left fa avf   Labs: Basic Metabolic Panel:  Lab 08/04/11 6295 08/03/11 0810 08/01/11 2135 07/30/11 0600 07/29/11 1531  NA 140 141 139 139 136  K 3.6 3.6 3.9 4.2 3.6  CL 103 101 98 97 94*  CO2 26 24 24 30  34*  GLUCOSE 138* 104* 99 84 116*  BUN 20 29* 44* 28* 19  CREATININE 5.35* 6.70* 7.48* 4.83* 3.87*  ALB -- -- -- -- --  CALCIUM 8.9 8.9 9.1 9.3 9.9  PHOS 3.2 5.4* 6.4* -- --   Liver Function Tests:  Lab 08/04/11 0610 08/03/11 0810 08/01/11 2135 07/30/11 0600 07/29/11 1531  AST -- -- -- 28 32  ALT -- -- -- 32 41*  ALKPHOS -- -- -- 48 53  BILITOT -- -- -- 0.4 0.4  PROT -- -- -- 6.0 6.9  ALBUMIN 2.7* 3.0* 3.2* -- --    Lab 07/29/11 1531  LIPASE 21  AMYLASE --   No results found for this basename: AMMONIA:3 in the last 168 hours CBC:  Lab 08/04/11 0610 08/03/11 0810 08/01/11 2135 07/30/11 0600 07/29/11 1531  WBC 6.3 8.0 5.1 5.4 --  NEUTROABS -- -- -- -- 5.6  HGB 10.1* 9.9* 10.5* 10.0* --  HCT 30.9* 29.7* 31.0* 30.4* --  MCV 91.2 89.2 88.3 92.4 --  PLT 274 301 310 267 --   PT/INR: @labrcntip (inr:5) Cardiac Enzymes: No results found for this basename: CKTOTAL:5,CKMB:5,CKMBINDEX:5,TROPONINI:5 in the last 168 hours CBG:  Lab 08/02/11 1633  GLUCAP 103*    Iron Studies: No results found for this basename: IRON:30,TIBC:30,TRANSFERRIN:30,FERRITIN:30 in the last 168 hours  Xrays/Other Studies: Dg Cholangiogram Operative  08/02/2011  *RADIOLOGY REPORT*  Clinical Data:   Cholelithiasis  INTRAOPERATIVE CHOLANGIOGRAM  Technique:  Cholangiographic image from the C-arm fluoroscopic device   submitted for interpretation post-operatively.  Please see the procedural report for the amount of contrast and the fluoroscopy time utilized.  Comparison:  None  Findings:  No persistent filling defects in the common duct. Intrahepatic ducts are incompletely visualized, appearing decompressed centrally. Contrast passes  into the duodenum.  IMPRESSION  Negative for retained common duct stone.  Original Report Authenticated By: Osa Craver, M.Wolf.   Dg Chest Port 1 View  08/02/2011  *RADIOLOGY REPORT*  Clinical Data: Dialysis patient, postop  PORTABLE CHEST - 1 VIEW  Comparison: Chest x-ray of 04/04/2008  Findings: The lungs are hyperaerated.  There is cardiomegaly present and there may be mild pulmonary vascular congestion present.  No acute bony abnormality is seen, with degenerative change in the thoracic spine.  IMPRESSION: Cardiomegaly.  Possible mild pulmonary vascular congestion. Hyperaeration.  Original Report Authenticated By: Juline Patch, M.Wolf.   Outpatient Dialysis Orders: Center: [facility] on mwf .  EDW 58.5 HD Bath 2k, 2.5 ca Time 3.5 hrs Heparin tight. Access left forearm avf BFR 400 DFR 600  Zemplar 3 mcg IV/HD Epogen 2400 Units  IV/HD Venofer 0  Other 0  Assessment/Plan:  1. Abd pain/N/V with cholelithiasis - surgery has seen, INR to be corrected, possible lap chole soon.  2. ESRD -MWF [place], tight hep. HD tonight.  3. Hypertension/volume - hd and norvasc/ clonidine. 3 kg over dry wt, HD tonight.  4. Anemia - epogen on hd 5. Metabolic bone disease - zemplar on hd , phoslo binder  6. TIA / hx pe and dvt on coumadin - per primary, receiving vit K  Thank you for the referral, we will follow.  Vinson Moselle  MD [facility] [phone] pgr    [phone] cell 08/04/2011, 2:48 PM

## 2011-08-04 NOTE — Discharge Summary (Signed)
Physician Discharge Summary  Patient ID: Judy Wolf MRN: [id number] DOB/AGE: [birth year] 78 y.o.  Admit date: 07/29/2011 Discharge date: 08/05/2011  Primary Care Physician:  Pcp Not In System   Discharge Diagnoses:    Patient Active Problem List  Diagnoses  . MUSCLE SPASM  . ESRD (end stage renal disease) on dialysis  . Abdominal pain  . Diabetes mellitus  . Symptomatic cholelithiasis  . HTN (hypertension)  . TIA (transient ischemic attack)  . Oral thrush    Medication List  As of 08/05/2011 12:11 PM   TAKE these medications         amLODipine 10 MG tablet   Commonly known as: NORVASC   Take 10 mg by mouth 2 (two) times daily.      cloNIDine 0.1 MG tablet   Commonly known as: CATAPRES   Take 0.1 mg by mouth 3 (three) times daily.      fluconazole 100 MG tablet   Commonly known as: DIFLUCAN   Take 1 tablet (100 mg total) by mouth daily.      HYDROcodone-acetaminophen 5-500 MG per tablet   Commonly known as: VICODIN   Take 1 tablet by mouth daily as needed. pain      warfarin 5 MG tablet   Commonly known as: COUMADIN   Take 5 mg by mouth daily. 1 tablet on Tuesday, 1 and 1/2 tablet all other days.             Disposition and Follow-up:  Follow up with primary MD, primary nephrologist and surgeon.  Consults:  nephrology and general surgery  Dr Malen Gauze, nephrologist. Dr Jimmye Norman, Surgeon.  Significant Diagnostic Studies:  Nm Hepatobiliary Liver Func  07/30/2011  *RADIOLOGY REPORT*  Clinical Data: Rule out acute cholecystitis  NUCLEAR MEDICINE HEPATOHBILIARY INCLUDE GB  Radiopharmaceutical: 5.5 mCi Tc 39m Choletec  Comparison: gallbladder ultrasound 07/29/2011  Findings: There is normal uptake of the tracer by the liver. Gallbladder is visualized at 20 minutes.  CBD is visualized at 35 minutes.  Bowel activity is noted at 45 minutes.  IMPRESSION: No evidence of cholecystitis with gallbladder visualized at 20 minutes.  No cystic duct obstruction.  No CBD  obstruction. Bowel activity noted at 45 minutes.  Original Report Authenticated By: Natasha Mead, M.D.   US Abdomen Complete  07/29/2011  *RADIOLOGY REPORT*  Clinical Data:  Right upper quadrant abdominal pain.  ABDOMINAL ULTRASOUND COMPLETE  Comparison:  CT on 11/14/2009  Findings:  Gallbladder:  A few tiny less than 5 mm mobile gallstones are seen as well as a small amount of layering echogenic sludge.  There is no evidence of gallbladder wall thickening or pericholecystic fluid.  Common Bile Duct:  Within normal limits in caliber. Measures 5 mm in diameter.  Liver: No focal mass lesion identified.  Within normal limits in parenchymal echogenicity.  IVC:  Appears normal.  Pancreas:  No abnormality identified.  Spleen:  Within normal limits in size and echotexture.  Right kidney:  A small size measuring 8.1 cm length.  Diffusely increased renal parenchymal echogenicity, consistent with medical renal disease.  Small cysts seen measuring up to 1 cm in size.  No evidence of renal mass or hydronephrosis.  Left kidney:  A small size, measuring 8.9 cm length.  Diffusely increased renal parenchymal echogenicity, consistent with medical renal disease.  Several renal cysts, largest measuring 2 cm.  No evidence of renal mass or hydronephrosis.  Abdominal Aorta:  No aneurysm identified.  IMPRESSION:  1.  Several tiny  gallstones and gallbladder sludge.  No evidence of gallbladder wall thickening or biliary dilatation. 2.  Small kidneys with increased parenchymal echogenicity, consistent with chronic medical renal disease.  No evidence of hydronephrosis.  Original Report Authenticated By: Danae Orleans, M.D.   Brief H and P: For complete details, refer to admission H and P. However, in brief, this is a 78 year old [place] American female, with history of end-stage renal disease on hemodialysis (Mon, Wed, Fri), hypertension, history of cerebral aneurysm, TIA, PE, on Coumadin, peripheral vascular disease, hypertension, diabetes  mellitus, presenting to the ED with abdominal pain and nausea/vomiting at hemodialysis for a few days, and felt weak and feverish. She was admitted for further evaluation, investigation and management.  Physical Exam: On 08/05/11. General: Ambulant, comfortable, alert, communicative, fully oriented, not short of breath at rest.  HEENT: Mild clinical pallor, no jaundice, no conjunctival injection or discharge. Hydration status is fair. Oral thrush has visibly improved.  NECK: Supple, JVP not seen, no carotid bruits, no palpable lymphadenopathy, no palpable goiter.  CHEST: Clinically clear to auscultation, no wheezes, no crackles.  HEART: Sounds 1 and 2 heard, normal, regular, no murmurs.  ABDOMEN: Full, soft, non-tender, no palpable organomegaly, no palpable masses, normal bowel sounds.  GENITALIA: Not examined.  LOWER EXTREMITIES: No pitting edema, palpable peripheral pulses.  MUSCULOSKELETAL SYSTEM: Generalized osteoarthritic changes, otherwise, normal.  CENTRAL NERVOUS SYSTEM: No focal neurologic deficit on gross examination.   Hospital Course:  Principal Problem:  *Abdominal pain: Due to gall bladder disease. See details below.  Active Problems:  1. Symptomatic cholelithiasis/Chronic calculous cholecystitis: Patient presented as described above. She had apparently had a prior CT scan done in [facility], which showed gallstones and thickened gallbladder wall. Abdominal U/S of 07/29/11, showed several tiny gallstones and gallbladder sludge. No evidence of gallbladder wall thickening or biliary dilatation  Hida scan of 07/30/11, showed no evidence of cholecystitis with gallbladder visualized at 20 minutes. No cystic duct obstruction. No CBD obstruction. Bowel activity noted at 45 minutes. Surgical consultation was initally provided by Baldemar Friday, and patient was placed on antibiotic cover with Zosyn. She proceeded to laparoscopic cholecystectomy on 08/02/11, performed by Dr Jamey Ripa, in an  uncomplicated procedure, and by POD# 2, was tolerating advanced diet, without any deleterious effects. Zosyn has been discontinued on 08/04/11 (day# 7), on recommendations of the surgical team.  2. ESRD (end stage renal disease) on dialysis: Nephrology consultation was provided by Dr. Malen Gauze. She was continued on hemodialysis Monday, Wednesday, Friday, and remained clinically stable.  3. Diabetes mellitus: This was controlled on appropriate die, and patient remained euglycemict.  4. HTN (hypertension): Controlled on Clonidine and Amlodipine.  5. TIA (transient ischemic attack): Patient remained stable, and no recurrence was evident.  6. Anticoagulation: anticoagulation was held initially, in anticipation of surgery, however, on recommendation of the surgical team, this was restarted on 08/03/11.  7. Oral thrush: This was an incidental finding on 08/03/11, and was managed with a seven day course of Diflucan, to be completed on 08/09/11.   Comment : Stable for discharge on 08/05/11, after dialysis.   Time spent on Discharge: 45 mins.  Signed: Shamarion Coots,CHRISTOPHER 08/05/2011, 12:11 PM

## 2011-08-04 NOTE — Progress Notes (Signed)
Subjective:   Feeling well, tolerating food  Objective: Vital signs in last 24 hours: Temp:  [97.7 F (36.5 C)-98.5 F (36.9 C)] 97.9 F (36.6 C) (02/14 0546) Pulse Rate:  [65-85] 76  (02/14 0546) Resp:  [14-20] 18  (02/14 0546) BP: (137-160)/(52-71) 137/57 mmHg (02/14 0546) SpO2:  [95 %-99 %] 98 % (02/14 0546) Weight:  [56.2 kg (123 lb 14.4 oz)-58.1 kg (128 lb 1.4 oz)] 58.1 kg (128 lb 1.4 oz) (02/13 1950) Weight change: -2.4 kg (-5 lb 4.7 oz)  Intake/Output from previous day: 02/13 0701 - 02/14 0700 In: 447.2 [P.O.:180; I.V.:117.2; IV Piggyback:150] Out: 1462    EXAM: General appearance: alert, in no apparent  Resp:  CTA bliaterally Cardio:  RRR with Gr II/VI systolic murmur GI: + BS, soft with general tenderness Extremities:  No edema Access:  AVF @ LFA, on HD  Lab Results:  [facility] 08/04/11 0610 08/03/11 0810  WBC 6.3 8.0  HGB 10.1* 9.9*  HCT 30.9* 29.7*  PLT 274 301   BMET:  Basename 08/04/11 0610 08/03/11 0810  NA 140 141  K 3.6 3.6  CL 103 101  CO2 26 24  GLUCOSE 138* 104*  BUN 20 29*  CREATININE 5.35* 6.70*  CALCIUM 8.9 8.9  ALBUMIN 2.7* 3.0*   No results found for this basename: PTH:2 in the last 72 hours Iron Studies: No results found for this basename: IRON,TIBC,TRANSFERRIN,FERRITIN in the last 72 hours  Assessment/Plan: 1. Abd pain/N/V with cholelithiasis - s/p laparoscopic cholecystectomy per Dr. Jamey Ripa tuesday, stable. 2. ESRD - MWF in [place], tight hep, stable s/p HD yesterday, K 3.6.  3. Hypertension/volume - BP stable with HD and Norvasc/ Clonidine, down to dry wt, net UF of 1462 ml yesterday.  4. Anemia - epogen on HD as outpatient, Hgb 10.1 s/p Aranesp 60 mg IV yesterday.  5. Metabolic bone disease - Zemplar 3 mcg on hd, phoslo binder.  6. TIA / hx pe and dvt on coumadin - s/p INR reversal, INR 1.3 yesterday.     LOS: 6 days   LYLES,CHARLES 08/04/2011,8:41 AM Patient seen and examined and agree with assessment and plan as above.     Vinson Moselle  MD [employer] [phone] pgr    [phone] cell 08/04/2011, 1:22 PM

## 2011-08-04 NOTE — Progress Notes (Signed)
ANTICOAGULATION CONSULT NOTE - Follow Up Consult  Pharmacy Consult for Coumadin Indication: h/o PE and TIA  No Known Allergies  Patient Measurements: Height: 5\' 2"  (157.5 cm) Weight: 128 lb 1.4 oz (58.1 kg) IBW/kg (Calculated) : 50.1  Heparin Dosing Weight:   Vital Signs: Temp: 97.9 F (36.6 C) (02/14 0546) Temp src: Oral (02/14 0546) BP: 137/57 mmHg (02/14 0546) Pulse Rate: 76  (02/14 0546)  Labs:  Basename 08/04/11 0610 08/03/11 0810 08/02/11 0602 08/01/11 2135  HGB 10.1* 9.9* -- --  HCT 30.9* 29.7* -- 31.0*  PLT 274 301 -- 310  APTT -- -- -- --  LABPROT 14.5 15.7* 16.8* --  INR 1.11 1.22 1.34 --  HEPARINUNFRC -- -- -- --  CREATININE 5.35* 6.70* -- 7.48*  CKTOTAL -- -- -- --  CKMB -- -- -- --  TROPONINI -- -- -- --   Estimated Creatinine Clearance: 6.6 ml/min (by C-G formula based on Cr of 5.35).   Assessment: Anticoagulation: Coumadin for history of PE and CVA. history of cva. - INR 3.49 on admit (2/8 on  7.5mg  po qday, except 5mg  on Tuesdays). Coumadin has been on hold for cholecystectomy 02/12. INR 1.11  Infectious Disease: Tmax 100.2 with WBC 6.3. Zosyn d/c for cholecystitis. Fluconazole 100mg /d #2/7.  Cardiovascular: HTN, h/o cerebral aneurysm, PVD:Max BP 178/76 yesterday Meds: Norvasc 10mg  bid, Clonidine TID  Endocrinology: DM noted in history but A1c 5.8  Gastrointestinal / Nutrition: N/V with gallstones. S/p cholecystectomy 2/12. PO PPI.  Neurology: h/o TIA and cerebral aneurysm.   Nephrology:ESRD MWF  Pulmonary: H/o PE   Hematology / Oncology: Anemia of chronic dz   Goal of Therapy:  INR 2-3   Plan:  Coumadin 10mg  po x 1 today.  Misty Stanley Stillinger 08/04/2011,9:26 AM

## 2011-08-04 NOTE — Progress Notes (Signed)
Patient feels well - tolerating diet - abd is benign and wounds OK.  See note from KB,PA.  Will need to see in office about three weeks from now

## 2011-08-04 NOTE — Progress Notes (Signed)
2 Days Post-Op  Subjective: Pt ok. Feels good. Sore. No N/V and has eaten pretty well.  Objective: Vital signs in last 24 hours: Temp:  [97.7 F (36.5 C)-98.5 F (36.9 C)] 97.9 F (36.6 C) (02/14 0546) Pulse Rate:  [65-85] 76  (02/14 0546) Resp:  [14-20] 18  (02/14 0546) BP: (137-160)/(52-71) 137/57 mmHg (02/14 0546) SpO2:  [95 %-99 %] 98 % (02/14 0546) Weight:  [56.2 kg (123 lb 14.4 oz)-58.1 kg (128 lb 1.4 oz)] 58.1 kg (128 lb 1.4 oz) (02/13 1950) Last BM Date: 08/03/11  Intake/Output this shift:    Physical Exam: BP 137/57  Pulse 76  Temp(Src) 97.9 F (36.6 C) (Oral)  Resp 18  Ht 5\' 2"  (1.575 m)  Wt 58.1 kg (128 lb 1.4 oz)  BMI 23.43 kg/m2  SpO2 98% Lungs: CTA without w/r/r Heart: Regular Abdomen: soft, ND, appropriately tender   Incisions all c/d/i without erythema or hematoma. Ext: No edema or tenderness   Labs: CBC  Basename 08/04/11 0610 08/03/11 0810  WBC 6.3 8.0  HGB 10.1* 9.9*  HCT 30.9* 29.7*  PLT 274 301   BMET  Basename 08/04/11 0610 08/03/11 0810  NA 140 141  K 3.6 3.6  CL 103 101  CO2 26 24  GLUCOSE 138* 104*  BUN 20 29*  CREATININE 5.35* 6.70*  CALCIUM 8.9 8.9   LFT  Basename 08/04/11 0610  PROT --  ALBUMIN 2.7*  AST --  ALT --  ALKPHOS --  BILITOT --  BILIDIR --  IBILI --  LIPASE --   PT/INR  Basename 08/04/11 0610 08/03/11 0810  LABPROT 14.5 15.7*  INR 1.11 1.22   ABG No results found for this basename: PHART:2,PCO2:2,PO2:2,HCO3:2 in the last 72 hours  Studies/Results: Dg Cholangiogram Operative  08/02/2011  *RADIOLOGY REPORT*  Clinical Data:   Cholelithiasis  INTRAOPERATIVE CHOLANGIOGRAM  Technique:  Cholangiographic image from the C-arm fluoroscopic device   submitted for interpretation post-operatively.  Please see the procedural report for the amount of contrast and the fluoroscopy time utilized.  Comparison:  None  Findings:  No persistent filling defects in the common duct. Intrahepatic ducts are incompletely  visualized, appearing decompressed centrally. Contrast passes into the duodenum.  IMPRESSION  Negative for retained common duct stone.  Original Report Authenticated By: Osa Craver, M.D.   Dg Chest Port 1 View  08/02/2011  *RADIOLOGY REPORT*  Clinical Data: Dialysis patient, postop  PORTABLE CHEST - 1 VIEW  Comparison: Chest x-ray of 04/04/2008  Findings: The lungs are hyperaerated.  There is cardiomegaly present and there may be mild pulmonary vascular congestion present.  No acute bony abnormality is seen, with degenerative change in the thoracic spine.  IMPRESSION: Cardiomegaly.  Possible mild pulmonary vascular congestion. Hyperaeration.  Original Report Authenticated By: Juline Patch, M.D.    Assessment: Principal Problem:  *Abdominal pain Active Problems:  ESRD (end stage renal disease) on dialysis  Diabetes mellitus  Symptomatic cholelithiasis  HTN (hypertension)  TIA (transient ischemic attack)   Procedure(s): LAPAROSCOPIC CHOLECYSTECTOMY WITH INTRAOPERATIVE CHOLANGIOGRAM  Plan: On reg diet. BAck on COUmadin No need for abx anymore, will DC Have left DC instructions and follow up Pt has Vicodin at home.   LOS: 6 days    Alyse Low 08/04/2011 8:11 AM

## 2011-08-04 NOTE — Progress Notes (Signed)
Subjective: No new issues.  Objective: Vital signs in last 24 hours: Temp:  [97.9 F (36.6 C)-98.5 F (36.9 C)] 97.9 F (36.6 C) (02/14 1300) Pulse Rate:  [54-76] 58  (02/14 1300) Resp:  [18-20] 20  (02/14 1300) BP: (109-146)/(53-64) 109/64 mmHg (02/14 1300) SpO2:  [95 %-99 %] 99 % (02/14 1300) Weight:  [58.1 kg (128 lb 1.4 oz)] 58.1 kg (128 lb 1.4 oz) (02/13 1950) Weight change: -2.4 kg (-5 lb 4.7 oz) Last BM Date: 08/04/11  Intake/Output from previous day: 02/13 0701 - 02/14 0700 In: 447.2 [P.O.:180; I.V.:117.2; IV Piggyback:150] Out: 1462  Total I/O In: 480 [P.O.:480] Out: -    Physical Exam: General: Ambulant, comfortable, alert, communicative, fully oriented, not short of breath at rest.  HEENT:  Mild clinical pallor, no jaundice, no conjunctival injection or discharge. Hydration status is fair. Oral thrush has visibly improved. NECK:  Supple, JVP not seen, no carotid bruits, no palpable lymphadenopathy, no palpable goiter. CHEST:  Clinically clear to auscultation, no wheezes, no crackles. HEART:  Sounds 1 and 2 heard, normal, regular, no murmurs. ABDOMEN:  Full, soft, non-tender, no palpable organomegaly, no palpable masses, normal bowel sounds. GENITALIA:  Not examined. LOWER EXTREMITIES:  No pitting edema, palpable peripheral pulses. MUSCULOSKELETAL SYSTEM:  Generalized osteoarthritic changes, otherwise, normal. CENTRAL NERVOUS SYSTEM:  No focal neurologic deficit on gross examination.  Lab Results:  [facility] 08/04/11 0610 08/03/11 0810  WBC 6.3 8.0  HGB 10.1* 9.9*  HCT 30.9* 29.7*  PLT 274 301    Basename 08/04/11 0610 08/03/11 0810  NA 140 141  K 3.6 3.6  CL 103 101  CO2 26 24  GLUCOSE 138* 104*  BUN 20 29*  CREATININE 5.35* 6.70*  CALCIUM 8.9 8.9   Recent Results (from the past 240 hour(s))  CLOSTRIDIUM DIFFICILE BY PCR     Status: Normal   Collection Time   08/04/11 10:25 AM      Component Value Range Status Comment   C difficile by pcr  NEGATIVE  NEGATIVE  Final      Studies/Results: Dg Chest Port 1 View  08/02/2011  *RADIOLOGY REPORT*  Clinical Data: Dialysis patient, postop  PORTABLE CHEST - 1 VIEW  Comparison: Chest x-ray of 04/04/2008  Findings: The lungs are hyperaerated.  There is cardiomegaly present and there may be mild pulmonary vascular congestion present.  No acute bony abnormality is seen, with degenerative change in the thoracic spine.  IMPRESSION: Cardiomegaly.  Possible mild pulmonary vascular congestion. Hyperaeration.  Original Report Authenticated By: Juline Patch, M.D.    Medications: Scheduled Meds:    . amLODipine  10 mg Oral BID  . chlorhexidine  1 application Topical Once  . cloNIDine  0.1 mg Oral TID  . darbepoetin (ARANESP) injection - DIALYSIS  60 mcg Intravenous Q Wed-HD  . fluconazole  100 mg Oral Daily  . pantoprazole  40 mg Oral Q breakfast  . sodium chloride  3 mL Intravenous Q12H  . warfarin  10 mg Oral ONCE-1800  . warfarin  5 mg Oral ONCE-1800  . DISCONTD: piperacillin-tazobactam (ZOSYN)  IV  2.25 g Intravenous Q8H   Continuous Infusions:    . sodium chloride 10 mL/hr (08/03/11 0626)   PRN Meds:.acetaminophen, acetaminophen, heparin, heparin, HYDROcodone-acetaminophen, HYDROmorphone, ondansetron (ZOFRAN) IV, ondansetron  Assessment/Plan:  Principal Problem:  *Abdominal pain: Due to gall bladder disease. See below. Active Problems:  1. ESRD (end stage renal disease) on dialysis: Managed by the nephrology team. Patient is on hemodialysis Monday, Wednesday, Friday.  Clinically stable.  2. Diabetes mellitus: Controlled on diet.   3. Symptomatic cholelithiasis/Chronic calculous cholecystitis: CT scan in [facility],  showed gallstones and thickened gallbladder wall. Abdominal U/S of 07/29/11, showed several tiny gallstones and gallbladder sludge. No evidence of gallbladder wall thickening or biliary dilatation Hida scan of 07/30/11, showed no evidence of cholecystitis with  gallbladder visualized at 20 minutes. No cystic duct obstruction. No CBD obstruction. Bowel activity noted at 45 minutes. Surgical consultation was initally provided by Baldemar Friday, and patient has since been on antibiotic cover. She is status post laparoscopic cholecystectomy on 08/02/11, performed by Dr Jamey Ripa, is on POD# 2 today, tolerating advanced diet, and Zosyn has been discontinued today (day# 7) on recommendations of the surgical team.   4. HTN (hypertension): Controlled on Clonidine and Amlodipine, and observe.  5. TIA (transient ischemic attack): Stable, no recurrence.  6. Anticoagulation: Per Surgical team, this was restarted on 08/03/11.  7. Oral thrush: Will manage with a seven day course of diflucan. Now day# 2.  Comment : Stable for discharge on 08/05/11, after dialysis.    LOS: 6 days   Cortasia Screws,CHRISTOPHER 08/04/2011, 4:19 PM

## 2011-08-05 ENCOUNTER — Encounter (HOSPITAL_COMMUNITY): Payer: Self-pay | Admitting: *Deleted

## 2011-08-05 ENCOUNTER — Inpatient Hospital Stay (HOSPITAL_COMMUNITY): Payer: Medicare Other

## 2011-08-05 LAB — RENAL FUNCTION PANEL
CO2: 24 mEq/L (ref 19–32)
Chloride: 100 mEq/L (ref 96–112)
Creatinine, Ser: 6.73 mg/dL — ABNORMAL HIGH (ref 0.50–1.10)
GFR calc Af Amer: 6 mL/min — ABNORMAL LOW (ref >90)
GFR calc non Af Amer: 5 mL/min — ABNORMAL LOW (ref >90)
Glucose, Bld: 106 mg/dL — ABNORMAL HIGH (ref 70–99)

## 2011-08-05 LAB — CBC
Hemoglobin: 10 g/dL — ABNORMAL LOW (ref 12.0–15.0)
MCH: 29.6 pg (ref 26.0–34.0)
MCV: 89.1 fL (ref 78.0–100.0)
Platelets: 360 10*3/uL (ref 150–400)
RBC: 3.38 MIL/uL — ABNORMAL LOW (ref 3.87–5.11)
WBC: 6.6 10*3/uL (ref 4.0–10.5)

## 2011-08-05 MED ORDER — FLUCONAZOLE 100 MG PO TABS: 100.0000 mg | ORAL_TABLET | Freq: Every day | ORAL | Status: AC

## 2011-08-05 NOTE — Progress Notes (Signed)
   CARE MANAGEMENT NOTE 08/05/2011  Patient:  Judy Wolf, Judy Wolf   Account Number:  [id number]  Date Initiated:  08/05/2011  Documentation initiated by:  Alvira Philips Assessment:   78 yr-old female adm with cholecystitis; lives alone, daughter, son, and granddaughter live in area and are supportive.     Anticipated DC Date:  08/06/2011   Anticipated DC Plan:  HOME W HOME HEALTH SERVICES      DC Planning Services  CM consult      [facility] Choice  HOME HEALTH   Choice offered to / List presented to:  C-1 Patient   DME arranged  WALKER - ROLLING  BEDSIDE COMMODE      DME agency  [facility]     HH arranged  HH-2 PT      [facility] agency  [facility]   Status of service:  Completed, signed off  Discharge Disposition:  HOME W HOME HEALTH SERVICES  Comments:  PCP:  Dr. Nyoka Lint  Contact:  Apphia Cropley, dtr  [phone]  08/05/11 1200 Lliam Hoh RN MSN CCM Pt recommends home health PT, rolling walker, and BSC, pt agrees.  Provided list of agencies, referral made per choice.  Equipment will be delivered to pt's home.

## 2011-08-07 ENCOUNTER — Emergency Department (HOSPITAL_COMMUNITY)
Admission: EM | Admit: 2011-08-07 | Discharge: 2011-08-07 | Disposition: A | Discharge status: Home / Self Care | Payer: Medicare Other | Attending: Emergency Medicine | Admitting: Emergency Medicine

## 2011-08-07 ENCOUNTER — Encounter (HOSPITAL_COMMUNITY): Payer: Self-pay

## 2011-08-07 ENCOUNTER — Other Ambulatory Visit: Payer: Self-pay

## 2011-08-07 DIAGNOSIS — R110 Nausea: Secondary | ICD-10-CM

## 2011-08-07 DIAGNOSIS — R5381 Other malaise: Secondary | ICD-10-CM | POA: Insufficient documentation

## 2011-08-07 DIAGNOSIS — R5383 Other fatigue: Secondary | ICD-10-CM | POA: Insufficient documentation

## 2011-08-07 DIAGNOSIS — Z8673 Personal history of transient ischemic attack (TIA), and cerebral infarction without residual deficits: Secondary | ICD-10-CM | POA: Insufficient documentation

## 2011-08-07 DIAGNOSIS — I10 Essential (primary) hypertension: Secondary | ICD-10-CM | POA: Insufficient documentation

## 2011-08-07 DIAGNOSIS — N390 Urinary tract infection, site not specified: Secondary | ICD-10-CM | POA: Diagnosis present

## 2011-08-07 DIAGNOSIS — Z9089 Acquired absence of other organs: Secondary | ICD-10-CM | POA: Insufficient documentation

## 2011-08-07 DIAGNOSIS — R112 Nausea with vomiting, unspecified: Secondary | ICD-10-CM | POA: Insufficient documentation

## 2011-08-07 HISTORY — DX: Encounter for other specified aftercare: Z51.89

## 2011-08-07 HISTORY — DX: Unspecified osteoarthritis, unspecified site: M19.90

## 2011-08-07 LAB — CBC
HCT: 31.7 % — ABNORMAL LOW (ref 36.0–46.0)
Hemoglobin: 10.7 g/dL — ABNORMAL LOW (ref 12.0–15.0)
MCH: 30.1 pg (ref 26.0–34.0)
RBC: 3.55 MIL/uL — ABNORMAL LOW (ref 3.87–5.11)

## 2011-08-07 LAB — COMPREHENSIVE METABOLIC PANEL
Alkaline Phosphatase: 51 U/L (ref 39–117)
BUN: 31 mg/dL — ABNORMAL HIGH (ref 6–23)
Chloride: 98 mEq/L (ref 96–112)
GFR calc Af Amer: 5 mL/min — ABNORMAL LOW (ref >90)
GFR calc non Af Amer: 5 mL/min — ABNORMAL LOW (ref >90)
Glucose, Bld: 118 mg/dL — ABNORMAL HIGH (ref 70–99)
Potassium: 4.2 mEq/L (ref 3.5–5.1)
Total Bilirubin: 0.2 mg/dL — ABNORMAL LOW (ref 0.3–1.2)

## 2011-08-07 LAB — DIFFERENTIAL
Lymphs Abs: 1 10*3/uL (ref 0.7–4.0)
Monocytes Absolute: 0.5 10*3/uL (ref 0.1–1.0)
Monocytes Relative: 7 % (ref 3–12)
Neutro Abs: 5.2 10*3/uL (ref 1.7–7.7)
Neutrophils Relative %: 77 % (ref 43–77)

## 2011-08-07 LAB — URINALYSIS, ROUTINE W REFLEX MICROSCOPIC
Ketones, ur: NEGATIVE mg/dL
Nitrite: NEGATIVE
Protein, ur: 100 mg/dL — AB
Urobilinogen, UA: 0.2 mg/dL (ref 0.0–1.0)

## 2011-08-07 LAB — URINE MICROSCOPIC-ADD ON

## 2011-08-07 MED ORDER — ONDANSETRON HCL 4 MG PO TABS: 4.0000 mg | ORAL_TABLET | Freq: Four times a day (QID) | ORAL | Status: AC

## 2011-08-07 MED ORDER — ONDANSETRON 4 MG PO TBDP
8.0000 mg | ORAL_TABLET | Freq: Once | ORAL | Status: AC
  Administered 2011-08-07: 8 mg via ORAL
  Filled 2011-08-07: qty 2

## 2011-08-07 NOTE — ED Provider Notes (Addendum)
History     CSN: [id number]  Arrival date & time 08/07/11  1245   First MD Initiated Contact with Patient 08/07/11 1257      Chief Complaint  Patient presents with  . Nausea    (Consider location/radiation/quality/duration/timing/severity/associated sxs/prior treatment) Patient is a 78 y.o. female presenting with weakness. The history is provided by the patient and the EMS personnel.  Weakness The primary symptoms include nausea and vomiting. Primary symptoms do not include headaches or fever. Primary symptoms comment: She underwent a cholecystectomy earlier this week and complains of nausea today. She attempted to take her medications for blood pressure and had one episode of vomiting. No fever.  The symptoms began 2 to 6 hours ago. The symptoms are unchanged.  Additional symptoms do not include weakness. Associated symptoms comments: She has done well post-surgery and has no complaint of abdominal pain that is worse than post-surgical soreness. .    Past Medical History  Diagnosis Date  . Hypertension   . Renal disorder   . TIA (transient ischemic attack)   . Arthritis   . Blood transfusion     Past Surgical History  Procedure Date  . Cholecystectomy 08/02/2011    Procedure: LAPAROSCOPIC CHOLECYSTECTOMY WITH INTRAOPERATIVE CHOLANGIOGRAM;  Surgeon: Currie Paris, MD;  Location: [facility] OR;  Service: General;  Laterality: N/A;  . Abdominal hysterectomy     No family history on file.  History  Substance Use Topics  . Smoking status: Never Smoker   . Smokeless tobacco: Not on file  . Alcohol Use: No    OB History    Grav Para Term Preterm Abortions TAB SAB Ect Mult Living                  Review of Systems  Constitutional: Negative for fever and chills.  HENT: Negative.   Respiratory: Negative.   Cardiovascular: Negative.   Gastrointestinal: Positive for nausea and vomiting.       See HPI.  Musculoskeletal: Negative.   Skin: Negative.   Neurological: Negative  for weakness and headaches.    Allergies  Review of patient's allergies indicates no known allergies.  Home Medications   Current Outpatient Rx  Name Route Sig Dispense Refill  . ACETAMINOPHEN ER 650 MG PO TBCR Oral Take 650 mg by mouth every 8 (eight) hours as needed. For pain    . AMLODIPINE BESYLATE 10 MG PO TABS Oral Take 10 mg by mouth 2 (two) times daily.    Marland Kitchen CALCIUM ACETATE 667 MG PO CAPS Oral Take 667 mg by mouth 3 (three) times daily with meals.    Marland Kitchen CLONIDINE HCL 0.1 MG PO TABS Oral Take 0.1 mg by mouth 3 (three) times daily.    Marland Kitchen FLUCONAZOLE 100 MG PO TABS Oral Take 1 tablet (100 mg total) by mouth daily. 4 tablet 0  . FOSINOPRIL SODIUM 40 MG PO TABS Oral Take 40 mg by mouth daily.    Marland Kitchen HYDROCODONE-ACETAMINOPHEN 5-500 MG PO TABS Oral Take 1 tablet by mouth daily as needed. For pain    . RENA-VITE PO TABS Oral Take 1 tablet by mouth daily.    Marland Kitchen OVER THE COUNTER MEDICATION Oral Take 1 tablet by mouth as needed. Hyland's Leg Cramps (homeopathic) and Hyland's Leg Cramps PM (homeopathic) for cramps from dialyis    . SIMVASTATIN 40 MG PO TABS Oral Take 40 mg by mouth at bedtime. For cholesterol    . WARFARIN SODIUM 5 MG PO TABS Oral Take 5-7.5 mg  by mouth daily. 1 tablet (5 mg) on Tuesday, 1 1/2 tablet (7.5 mg) all other days.      BP 179/62  Pulse 82  Temp(Src) 98.7 F (37.1 C) (Oral)  Resp 20  Ht 5\' 2"  (1.575 m)  Wt 129 lb (58.514 kg)  BMI 23.59 kg/m2  SpO2 100%  Physical Exam  Constitutional: She appears well-developed and well-nourished.  HENT:  Head: Normocephalic.  Neck: Normal range of motion. Neck supple.  Cardiovascular: Normal rate and regular rhythm.   Pulmonary/Chest: Effort normal and breath sounds normal.  Abdominal: Soft. Bowel sounds are normal. There is tenderness. There is no rebound and no guarding.       RUQ tenderness, mild. Soft abdomen. BS present. Surgical incisions unremarkable without evidence of infection - no redness, drainage.     Musculoskeletal: Normal range of motion.  Neurological: She is alert. No cranial nerve deficit.  Skin: Skin is warm and dry. No rash noted.  Psychiatric: She has a normal mood and affect.    ED Course  Procedures (including critical care time)  Labs Reviewed  CBC - Abnormal; Notable for the following:    RBC 3.55 (*)    Hemoglobin 10.7 (*)    HCT 31.7 (*)    Platelets 482 (*)    All other components within normal limits  COMPREHENSIVE METABOLIC PANEL - Abnormal; Notable for the following:    Glucose, Bld 118 (*)    BUN 31 (*)    Creatinine, Ser 7.54 (*)    Calcium 10.7 (*)    Total Bilirubin 0.2 (*)    GFR calc non Af Amer 5 (*)    GFR calc Af Amer 5 (*)    All other components within normal limits  DIFFERENTIAL  URINALYSIS, ROUTINE W REFLEX MICROSCOPIC   No results found.  Tolerating po fluids without further nausea. Urinalysis pending, if positive will treat and discharge home; if negative, will discharge home with surgery follow up if nausea persist. No diagnosis found.    MDM          Rodena Medin, PA-C 08/07/11 1550  Rodena Medin, PA-C 08/24/11 [phone]

## 2011-08-07 NOTE — ED Notes (Signed)
Pt given a cup of ice water to drink per [place], [place]

## 2011-08-07 NOTE — ED Notes (Signed)
Pt discharged home. Had no further questions. Family at bedside. VSS.

## 2011-08-07 NOTE — ED Provider Notes (Signed)
2:40 PM  I performed a history and physical examination of Judy Wolf and discussed her management with Langley Adie.  I agree with the history, physical, assessment, and plan of care, with the following exceptions: None  The patient is well-appearing, awake, alert, and oriented to person, place, time, and event, and in no apparent distress. She has had no further vomiting while in the emergency department. Her respirations are even and unlabored, and her pulses are regular and full to palpation. Her skin is warm and dry. Her abdomen reveals normal bowel sounds, with no tenderness to palpation, it is soft, with no rigidity, guarding, or rebound. The laparoscopic incisions appear to be healing well with no sign of infection or dehiscence. The patient has specifically no tenderness to palpation at the right upper quadrant.  I was present for the following procedures: None Time Spent in Critical Care of the patient: None Time spent in discussions with the patient and family: 5 minutes.  Manus Rudd, MD 08/07/11 [phone]

## 2011-08-07 NOTE — Discharge Instructions (Signed)
FOLLOW UP WITH [facility] IF NAUSEA PERSISTS FOR RECHECK. ZOFRAN FOR NAUSEA AS NEEDED. RETURN HERE WITH ANY HIGH FEVER, SEVERE PAIN OR NEW CONCERN.  B.R.A.T. Diet Your doctor has recommended the B.R.A.T. diet for you or your child until the condition improves. This is often used to help control diarrhea and vomiting symptoms. If you or your child can tolerate clear liquids, you may have:  Bananas.   Rice.   Applesauce.   Toast (and other simple starches such as crackers, potatoes, noodles).  Be sure to avoid dairy products, meats, and fatty foods until symptoms are better. Fruit juices such as apple, grape, and prune juice can make diarrhea worse. Avoid these. Continue this diet for 2 days or as instructed by your caregiver. Document Released: 06/06/2005 Document Revised: 02/16/2011 Document Reviewed: 11/23/2006 [facility] Patient Information 2012 [place], [place].

## 2011-08-07 NOTE — ED Notes (Signed)
Assisted pt with bedpan to attempt to collect an urine specimen; specimen collected and sent to lab for testing

## 2011-08-07 NOTE — ED Notes (Signed)
Pt given ice chips.  Permission given by Dr. Fredricka Bonine.

## 2011-08-07 NOTE — ED Notes (Signed)
Pt had gallstones removed Tuesday.  Pt states she was feeling better.  This morning she woke and and was going to have a cup of coffee "and I felt sick to my stomach.  I couldn't drink the coffee".  Per EMS, the pt had a home health person with her and her BP was taken.  The [facility] person stated her BP was elevated at 175/80.  Pt has history of hypertension.  HH person instructed her to take 1 BP pill which she had not had at that point.  When pt took pill she threw it back up.  Pt currently denies pain, but states she continues to have nausea.

## 2011-08-07 NOTE — ED Notes (Signed)
Pt undressed, in gown, on monitor, continuous pulse oximetry and blood pressure cuff; EKG performed 

## 2011-08-16 ENCOUNTER — Encounter (INDEPENDENT_AMBULATORY_CARE_PROVIDER_SITE_OTHER): Payer: Self-pay | Admitting: General Surgery

## 2011-08-16 ENCOUNTER — Ambulatory Visit (INDEPENDENT_AMBULATORY_CARE_PROVIDER_SITE_OTHER): Payer: Medicare Other | Admitting: General Surgery

## 2011-08-16 VITALS — BP 158/66 | HR 68 | Ht 60.0 in | Wt 129.0 lb

## 2011-08-16 DIAGNOSIS — K8010 Calculus of gallbladder with chronic cholecystitis without obstruction: Secondary | ICD-10-CM

## 2011-08-16 DIAGNOSIS — K8066 Calculus of gallbladder and bile duct with acute and chronic cholecystitis without obstruction: Secondary | ICD-10-CM

## 2011-08-16 NOTE — Patient Instructions (Signed)
Clean wounds with soap and water.  Call if you have any further problems.  Miralax use 1 packet per day as needed for constipation.

## 2011-08-16 NOTE — Progress Notes (Signed)
.  Judy Wolf [birth year] [id number] 08/16/2011   Judy Wolf is a 78 y.o. female who had a laparoscopic cholecystectomy with intraoperative cholangiogram.  The pathology report confirmed CHRONIC CHOLECYSTITIS, CHOLELITHIASIS.  The patient reports that they are feeling well with normal bowel movements and good appetite.  The pre-operative symptoms of abdominal pain, nausea, and vomiting have resolved.    Physical examination - Incisions appear well-healed with no sign of infection or bleeding.   Abdomen - soft, non-tender STILL A  Little sore .  Diarrhea has changed to constipation.  Impression:  s/p laparoscopic cholecystectomy  Plan:  She may resume a regular diet and full activity.  She may follow-up on a PRN basis. Renal diet, Miralax for constipation.

## 2011-08-16 NOTE — ED Provider Notes (Signed)
Evaluation and management procedures were performed by the PA/NP under my supervision/collaboration.   Traycen Goyer D Ranyah Groeneveld, MD 08/16/11 2010 

## 2011-08-24 NOTE — ED Provider Notes (Signed)
Evaluation and management procedures were performed by the PA/NP under my supervision/collaboration.   Shulem Mader D Maheen Cwikla, MD 08/24/11 2020 

## 2011-09-28 IMAGING — CT CT ANGIO PELVIS
2 of 7 series · 14 of 46 positions shown, 18 images · IV contrast (APPLIED)
Comparison: None.

CLINICAL DATA: GI bleeding.  Epistaxis.  Evaluate for colitis.
Ischemic bowel.

CT ABDOMEN AND PELVIS WITHOUT CONTRAST
TECHNIQUE: Multidetector CT imaging of the abdomen and pelvis was
performed following the standard protocol without intravenous
contrast.

[Series 5: abd cta 2.0 (id) · axial · 0.72mm/px · z∈[-588,-286]mm · 11 of 177 slices shown, 15 images]
[im 17/177  soft-tissue]
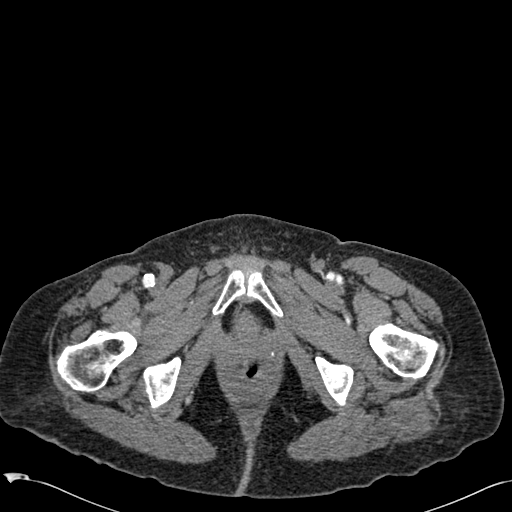
[im 17/177  bone]
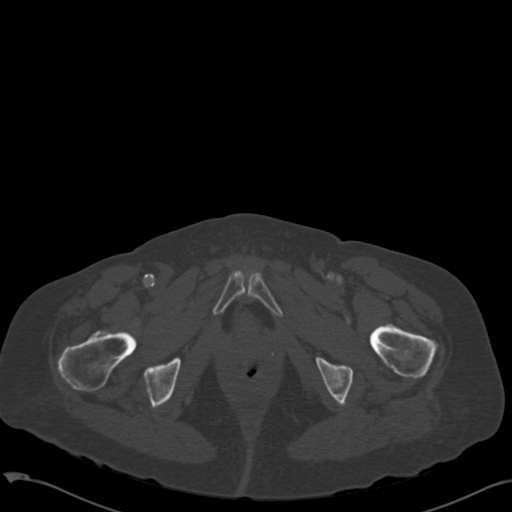
[im 34/177  soft-tissue]
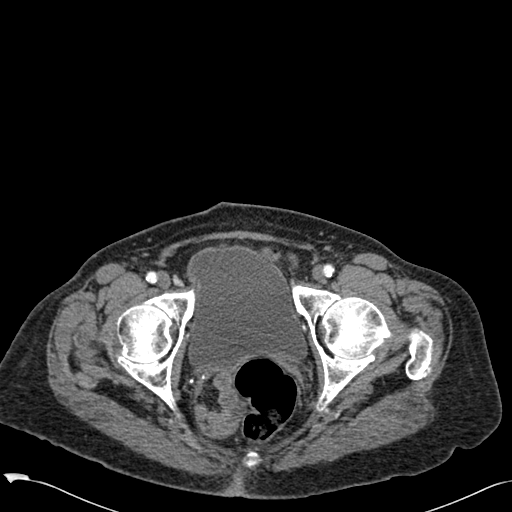
[im 51/177  soft-tissue]
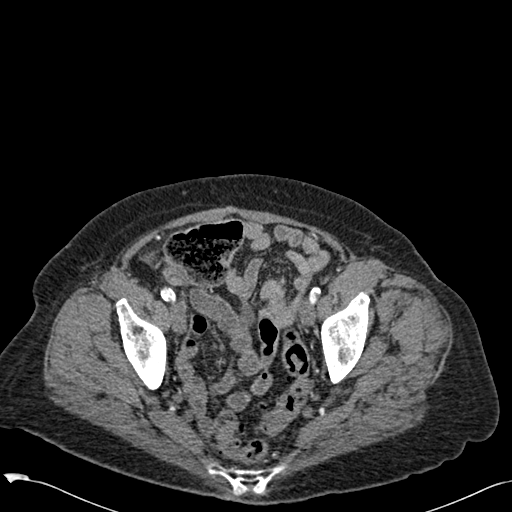
[im 68/177  soft-tissue]
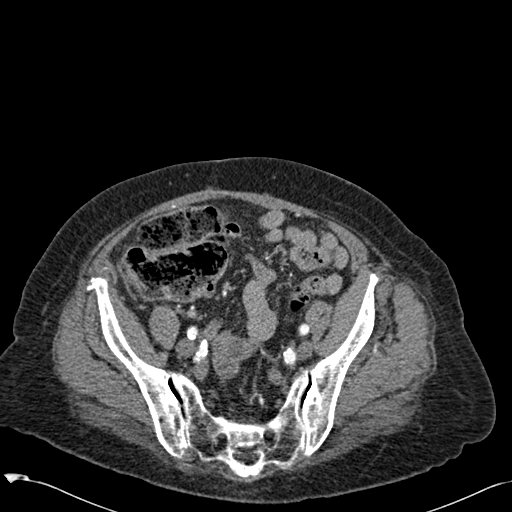
[im 93/177  soft-tissue]
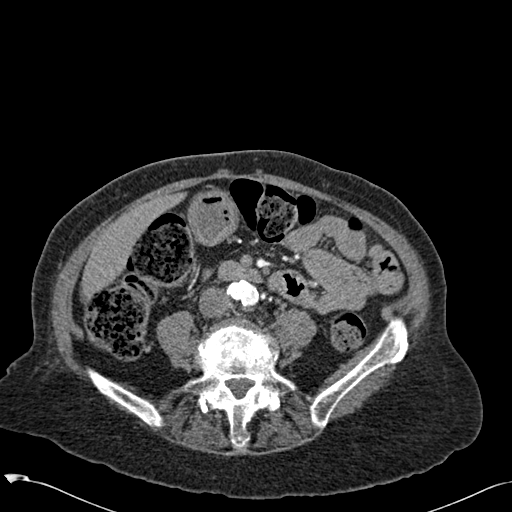
[im 109/177  soft-tissue]
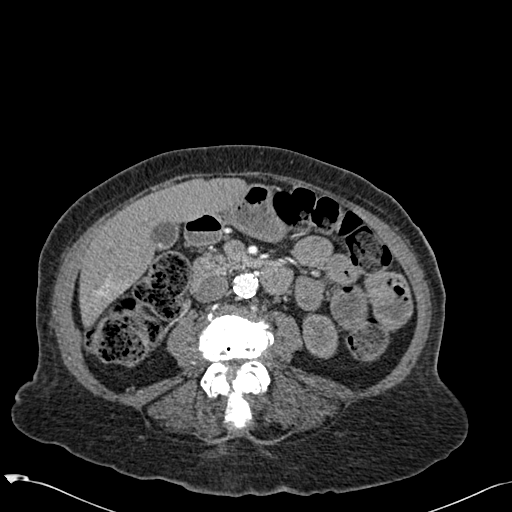
[im 126/177  soft-tissue]
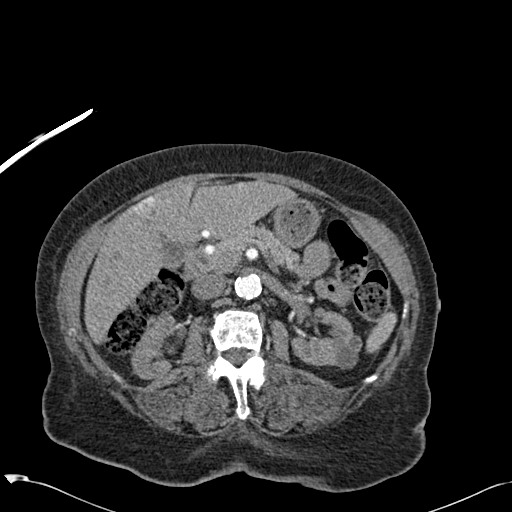
[im 143/177  soft-tissue]
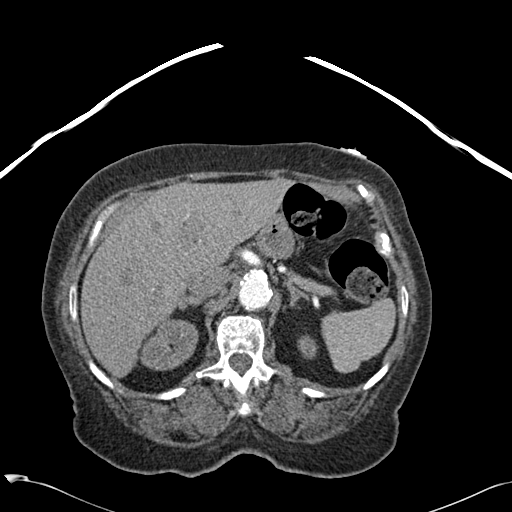
[im 143/177  lung]
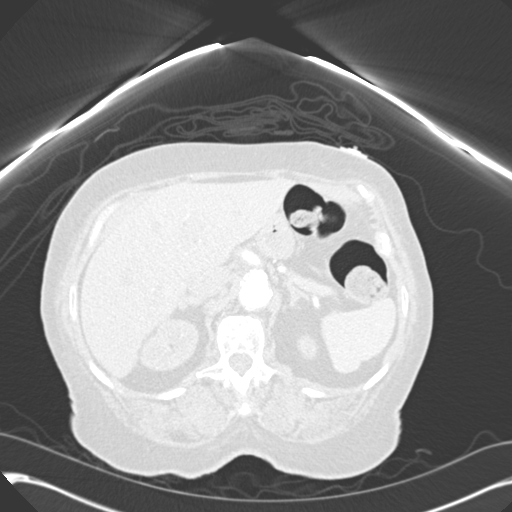
[im 151/177  lung]
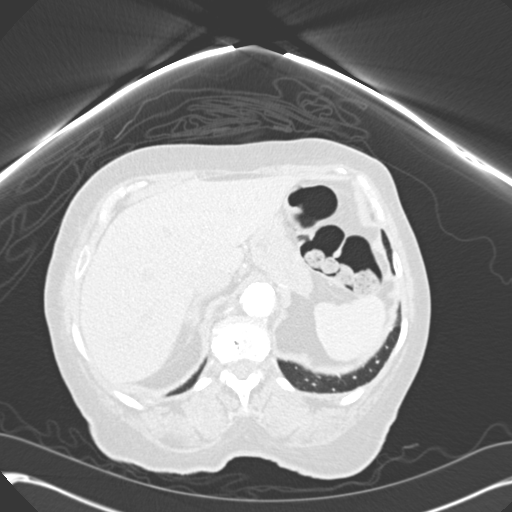
[im 160/177  soft-tissue]
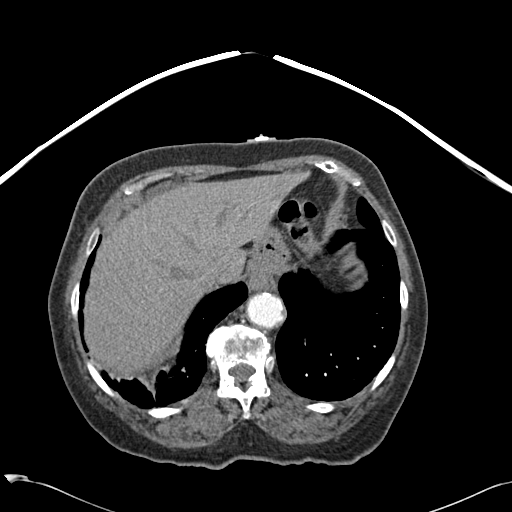
[im 160/177  lung]
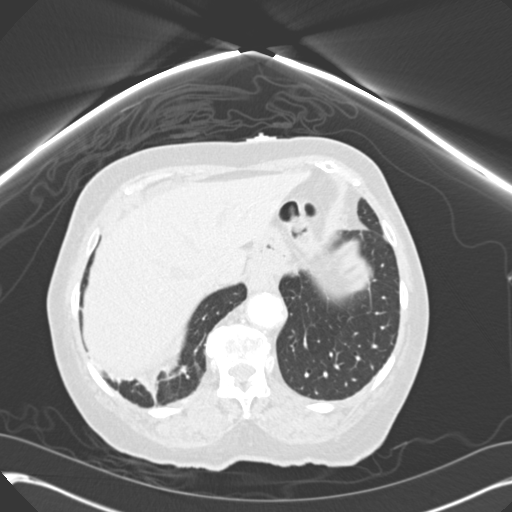
[im 160/177  bone]
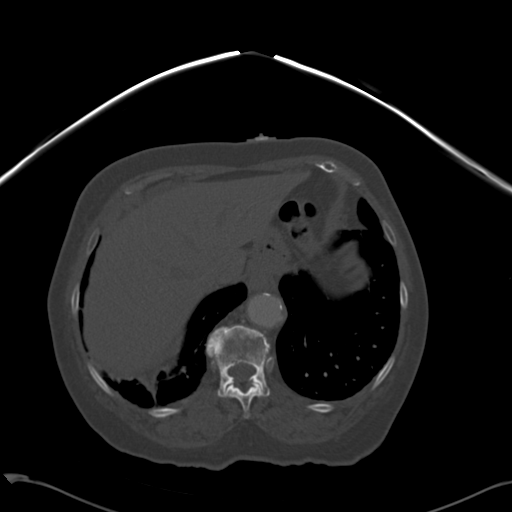
[im 168/177  lung]
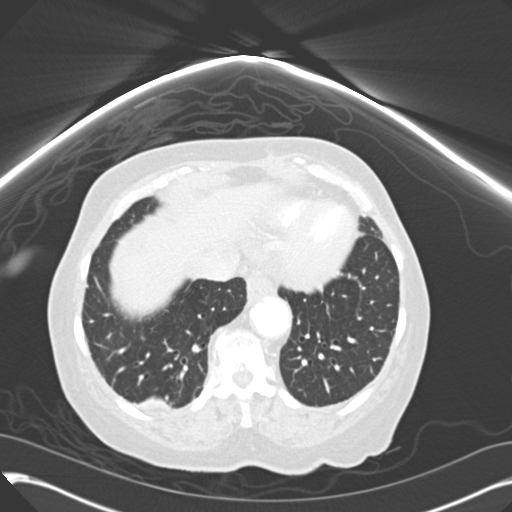

[Series 602: cor · coronal · 0.69mm/px · 3 of 69 slices shown]
[im 18/69  soft-tissue]
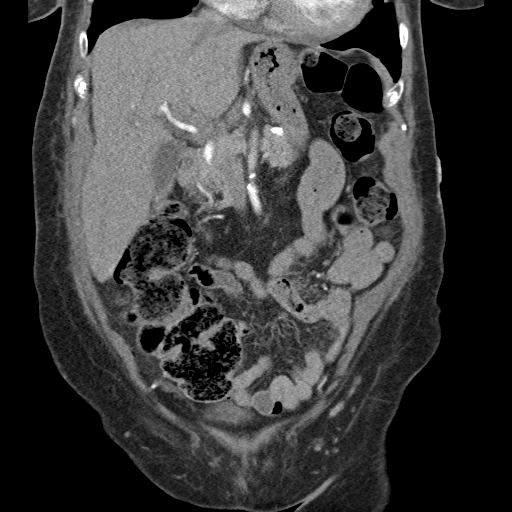
[im 35/69  soft-tissue]
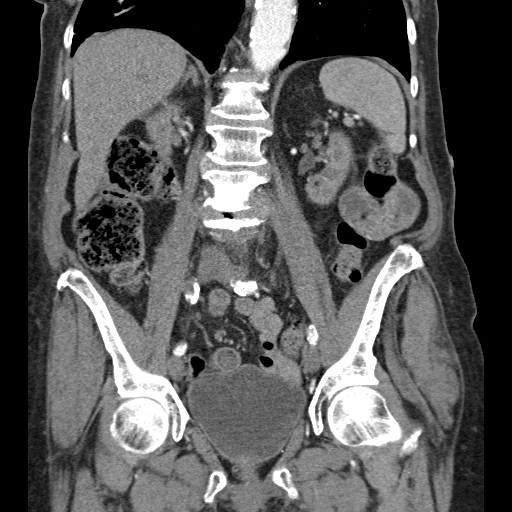
[im 52/69  soft-tissue]
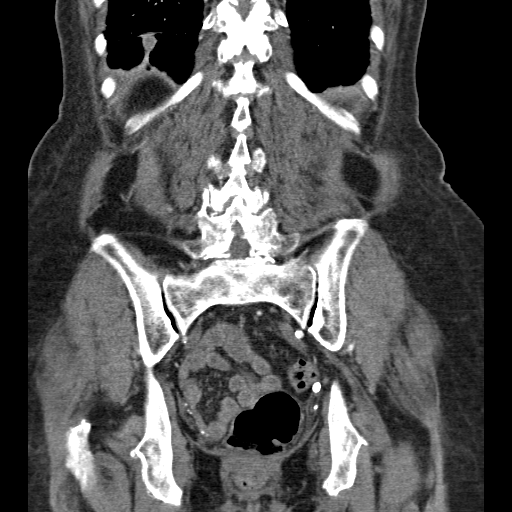

[14 of 46 positions shown; findings below may reference images not displayed]

FINDINGS: Small hiatal hernia. In the right lower lobe adjacent to
the hemidiaphragm, there is a 13 mm x 25 mm pulmonary nodule
extending from diaphragmatic surface to the posterior pleural
surface.   Pulmonary neoplasm is not excluded based on the
appearance although scarring could produce a similar appearance.
Small enhancing lesions are present in the liver, likely
hemangioma.  Kidneys are mildly atrophic bilaterally.  Two discrete
cystic lesions are present in the left interpolar region of the
kidney.  The smaller lesion measures 11 mm.  The larger lesion
measures 14 mm.   Small simple cyst is present in the right
inferior renal pole.  Dense abdominal aortic atherosclerosis is
present.  Renal ostial calcification.  Superior mesenteric artery
ostial calcification without significant stenosis.  Approximately
50% stenosis of the celiac axis origin.  Inferior mesenteric artery
shows contrast opacification.  Small saccular aneurysm is present
in the infrarenal aorta measuring 2.4 cm with either thrombosis or
mural plaque filling the aneurysmal portion.  Severe
atherosclerosis of the left common iliac artery and internal and
external iliac arteries without definite hemodynamically
significant stenosis.  The right common iliac artery is occluded
just after the bifurcation and reconstitutes via collateral flow
from the internal and external iliac arteries.

Mild fat stranding is present along the inferior surface of the
hepatic flexure of the colon.  Large fecal burden is present in the
ascending and transverse colon. No pneumatosis.  No portal venous
gas.  Colonic diverticulosis without diverticulitis.  Small bowel
appears within normal limits.  Lumbar spondylosis. A few tiny
dependently layering gallstones are present.
IMPRESSION: 1.  Fat stranding along the inferior surface of the hepatic
flexure.  This could potentially represent watershed ischemia
although there is no pneumatosis or discrete colonic wall
thickening.  Large fecal burden is present in this region and the
inflammation could be secondary to stercoral colitis.
2.  Indeterminate lesions of the interpolar region of the left
kidney.  MRI or ultrasound would be useful in further assessment.
Non-emergent MRI should be deferred until patient has been
discharged for the acute illness, and can optimally cooperate with
positioning and breath-holding instructions.
3.   2.5 cm right lower lobe pulmonary nodule.  This may represent
neoplasm or scarring.  Follow-up PET CT is recommended.
4.  Tiny 2.4 cm infrarenal abdominal aortic aneurysm.  Occlusion of
the right common iliac artery with distal reconstitution. Severe
aortic and visceral atherosclerosis.
5.  Cholelithiasis.

## 2011-10-05 IMAGING — CR DG RIBS 2V*R*
2 series · 2 of 2 positions shown · non-contrast
Comparison: 04/04/2008

CLINICAL DATA: Fall

RIGHT RIBS - 2 VIEW

[w ribs ap/pa upper right]
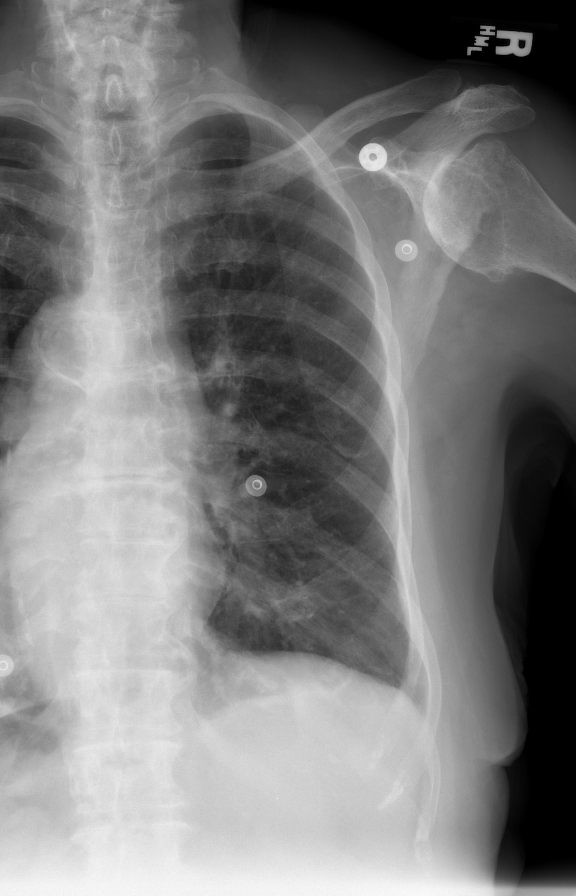

[w ribs oblique right]
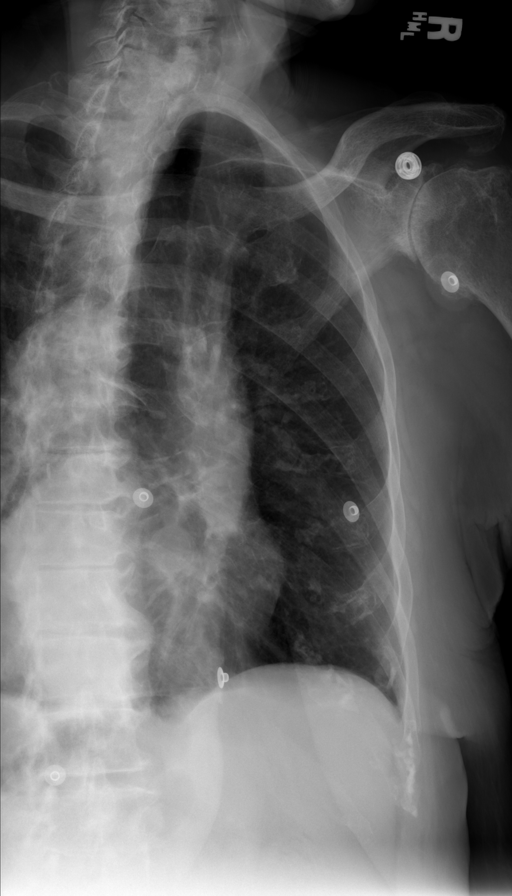

[2 of 2 positions shown; findings below may reference images not displayed]

FINDINGS: No acute rib fractures.  Visualized lungs are clear.
IMPRESSION: No acute bony injury.

## 2012-11-21 ENCOUNTER — Inpatient Hospital Stay (HOSPITAL_COMMUNITY)
Admission: EM | Admit: 2012-11-21 | Discharge: 2012-11-25 | DRG: 391 | Disposition: A | Discharge status: Home / Self Care | Payer: Medicare Other | Attending: Internal Medicine | Admitting: Internal Medicine

## 2012-11-21 ENCOUNTER — Encounter (HOSPITAL_COMMUNITY): Payer: Self-pay | Admitting: Emergency Medicine

## 2012-11-21 ENCOUNTER — Inpatient Hospital Stay (HOSPITAL_COMMUNITY): Payer: Medicare Other

## 2012-11-21 DIAGNOSIS — Z86718 Personal history of other venous thrombosis and embolism: Secondary | ICD-10-CM

## 2012-11-21 DIAGNOSIS — R112 Nausea with vomiting, unspecified: Secondary | ICD-10-CM | POA: Diagnosis present

## 2012-11-21 DIAGNOSIS — R1033 Periumbilical pain: Secondary | ICD-10-CM | POA: Diagnosis present

## 2012-11-21 DIAGNOSIS — I120 Hypertensive chronic kidney disease with stage 5 chronic kidney disease or end stage renal disease: Secondary | ICD-10-CM | POA: Diagnosis present

## 2012-11-21 DIAGNOSIS — Z8673 Personal history of transient ischemic attack (TIA), and cerebral infarction without residual deficits: Secondary | ICD-10-CM

## 2012-11-21 DIAGNOSIS — N186 End stage renal disease: Secondary | ICD-10-CM | POA: Diagnosis present

## 2012-11-21 DIAGNOSIS — R1110 Vomiting, unspecified: Secondary | ICD-10-CM

## 2012-11-21 DIAGNOSIS — R109 Unspecified abdominal pain: Secondary | ICD-10-CM | POA: Diagnosis present

## 2012-11-21 DIAGNOSIS — Z992 Dependence on renal dialysis: Secondary | ICD-10-CM

## 2012-11-21 DIAGNOSIS — K5289 Other specified noninfective gastroenteritis and colitis: Secondary | ICD-10-CM | POA: Diagnosis present

## 2012-11-21 DIAGNOSIS — K859 Acute pancreatitis without necrosis or infection, unspecified: Secondary | ICD-10-CM

## 2012-11-21 DIAGNOSIS — N390 Urinary tract infection, site not specified: Secondary | ICD-10-CM

## 2012-11-21 DIAGNOSIS — A09 Infectious gastroenteritis and colitis, unspecified: Principal | ICD-10-CM | POA: Diagnosis present

## 2012-11-21 DIAGNOSIS — B370 Candidal stomatitis: Secondary | ICD-10-CM

## 2012-11-21 DIAGNOSIS — M62838 Other muscle spasm: Secondary | ICD-10-CM

## 2012-11-21 DIAGNOSIS — E119 Type 2 diabetes mellitus without complications: Secondary | ICD-10-CM | POA: Diagnosis present

## 2012-11-21 DIAGNOSIS — I10 Essential (primary) hypertension: Secondary | ICD-10-CM | POA: Diagnosis present

## 2012-11-21 DIAGNOSIS — M129 Arthropathy, unspecified: Secondary | ICD-10-CM | POA: Diagnosis present

## 2012-11-21 DIAGNOSIS — G459 Transient cerebral ischemic attack, unspecified: Secondary | ICD-10-CM | POA: Diagnosis present

## 2012-11-21 DIAGNOSIS — N2581 Secondary hyperparathyroidism of renal origin: Secondary | ICD-10-CM | POA: Diagnosis present

## 2012-11-21 DIAGNOSIS — K8020 Calculus of gallbladder without cholecystitis without obstruction: Secondary | ICD-10-CM

## 2012-11-21 DIAGNOSIS — Z86711 Personal history of pulmonary embolism: Secondary | ICD-10-CM | POA: Diagnosis present

## 2012-11-21 DIAGNOSIS — Z79899 Other long term (current) drug therapy: Secondary | ICD-10-CM

## 2012-11-21 DIAGNOSIS — K529 Noninfective gastroenteritis and colitis, unspecified: Secondary | ICD-10-CM | POA: Diagnosis present

## 2012-11-21 DIAGNOSIS — D631 Anemia in chronic kidney disease: Secondary | ICD-10-CM | POA: Diagnosis present

## 2012-11-21 DIAGNOSIS — N039 Chronic nephritic syndrome with unspecified morphologic changes: Secondary | ICD-10-CM | POA: Diagnosis present

## 2012-11-21 HISTORY — DX: Acute embolism and thrombosis of unspecified deep veins of unspecified lower extremity: I82.409

## 2012-11-21 HISTORY — DX: Aneurysm of unspecified site: I72.9

## 2012-11-21 LAB — COMPREHENSIVE METABOLIC PANEL
ALT: 7 U/L (ref 0–35)
AST: 17 U/L (ref 0–37)
Albumin: 3.8 g/dL (ref 3.5–5.2)
CO2: 23 mEq/L (ref 19–32)
Calcium: 10.8 mg/dL — ABNORMAL HIGH (ref 8.4–10.5)
Chloride: 102 mEq/L (ref 96–112)
GFR calc non Af Amer: 4 mL/min — ABNORMAL LOW (ref >90)
Sodium: 142 mEq/L (ref 135–145)
Total Bilirubin: 0.4 mg/dL (ref 0.3–1.2)

## 2012-11-21 LAB — CBC WITH DIFFERENTIAL/PLATELET
Basophils Absolute: 0 10*3/uL (ref 0.0–0.1)
Lymphocytes Relative: 10 % — ABNORMAL LOW (ref 12–46)
Neutro Abs: 7 10*3/uL (ref 1.7–7.7)
Platelets: 386 10*3/uL (ref 150–400)
RDW: 13.3 % (ref 11.5–15.5)
WBC: 8.3 10*3/uL (ref 4.0–10.5)

## 2012-11-21 MED ORDER — LABETALOL HCL 5 MG/ML IV SOLN
10.0000 mg | Freq: Once | INTRAVENOUS | Status: AC
  Administered 2012-11-21: 10 mg via INTRAVENOUS
  Filled 2012-11-21: qty 4

## 2012-11-21 MED ORDER — INSULIN ASPART 100 UNIT/ML ~~LOC~~ SOLN
0.0000 [IU] | Freq: Three times a day (TID) | SUBCUTANEOUS | Status: DC
  Administered 2012-11-22 – 2012-11-24 (×2): 1 [IU] via SUBCUTANEOUS

## 2012-11-21 MED ORDER — ACETAMINOPHEN 325 MG PO TABS
650.0000 mg | ORAL_TABLET | Freq: Four times a day (QID) | ORAL | Status: DC | PRN
  Administered 2012-11-23 – 2012-11-25 (×2): 650 mg via ORAL
  Filled 2012-11-21 (×2): qty 2

## 2012-11-21 MED ORDER — ONDANSETRON HCL 4 MG/2ML IJ SOLN
4.0000 mg | Freq: Once | INTRAMUSCULAR | Status: AC
  Administered 2012-11-21: 4 mg via INTRAVENOUS
  Filled 2012-11-21: qty 2

## 2012-11-21 MED ORDER — LABETALOL HCL 5 MG/ML IV SOLN
10.0000 mg | INTRAVENOUS | Status: DC | PRN
  Administered 2012-11-21 – 2012-11-23 (×3): 10 mg via INTRAVENOUS
  Filled 2012-11-21 (×5): qty 4

## 2012-11-21 MED ORDER — HYDROMORPHONE HCL PF 1 MG/ML IJ SOLN
1.0000 mg | INTRAMUSCULAR | Status: DC | PRN
  Administered 2012-11-22 – 2012-11-23 (×2): 1 mg via INTRAVENOUS
  Filled 2012-11-21 (×2): qty 1

## 2012-11-21 MED ORDER — CLONIDINE HCL 0.2 MG/24HR TD PTWK
0.2000 mg | MEDICATED_PATCH | TRANSDERMAL | Status: DC
  Administered 2012-11-21: 0.2 mg via TRANSDERMAL
  Filled 2012-11-21: qty 1

## 2012-11-21 MED ORDER — ONDANSETRON HCL 4 MG/2ML IJ SOLN
4.0000 mg | Freq: Four times a day (QID) | INTRAMUSCULAR | Status: DC | PRN
  Administered 2012-11-22: 4 mg via INTRAVENOUS
  Filled 2012-11-21: qty 2

## 2012-11-21 MED ORDER — SODIUM CHLORIDE 0.9 % IJ SOLN
3.0000 mL | Freq: Two times a day (BID) | INTRAMUSCULAR | Status: DC
  Administered 2012-11-22 – 2012-11-25 (×4): 3 mL via INTRAVENOUS

## 2012-11-21 MED ORDER — HYDRALAZINE HCL 20 MG/ML IJ SOLN
10.0000 mg | INTRAMUSCULAR | Status: DC | PRN
  Administered 2012-11-21: 10 mg via INTRAVENOUS
  Filled 2012-11-21: qty 1

## 2012-11-21 MED ORDER — HYDRALAZINE HCL 20 MG/ML IJ SOLN
10.0000 mg | Freq: Once | INTRAMUSCULAR | Status: AC
  Administered 2012-11-21: 10 mg via INTRAVENOUS
  Filled 2012-11-21: qty 1

## 2012-11-21 MED ORDER — HYDRALAZINE HCL 20 MG/ML IJ SOLN
20.0000 mg | Freq: Once | INTRAMUSCULAR | Status: AC
  Administered 2012-11-21: 20 mg via INTRAVENOUS
  Filled 2012-11-21: qty 1

## 2012-11-21 MED ORDER — ONDANSETRON HCL 4 MG PO TABS: 4.0000 mg | ORAL_TABLET | Freq: Four times a day (QID) | ORAL | Status: DC | PRN

## 2012-11-21 MED ORDER — SODIUM CHLORIDE 0.9 % IJ SOLN
3.0000 mL | Freq: Two times a day (BID) | INTRAMUSCULAR | Status: DC
  Administered 2012-11-21 – 2012-11-25 (×7): 3 mL via INTRAVENOUS

## 2012-11-21 MED ORDER — ACETAMINOPHEN 650 MG RE SUPP: 650.0000 mg | Freq: Four times a day (QID) | RECTAL | Status: DC | PRN

## 2012-11-21 MED ORDER — HYDRALAZINE HCL 20 MG/ML IJ SOLN: 10.0000 mg | Freq: Once | INTRAMUSCULAR | Status: DC

## 2012-11-21 NOTE — ED Notes (Signed)
Pt given ginger ale for fluid challenge. Pt unable to tolerate fluids. Pt vomited a small amount of yellow emesis

## 2012-11-21 NOTE — ED Provider Notes (Signed)
History     CSN: [id number]  Arrival date & time 11/21/12  1633   First MD Initiated Contact with Patient 11/21/12 1655      Chief Complaint  Patient presents with  . Emesis    (Consider location/radiation/quality/duration/timing/severity/associated sxs/prior treatment) HPI Comments: 79 y.o. female who is MWF dialysis patient and then presents to the Er w/ N/V. She denies diarrhea. Started this morning. She did not go to her dialysis therapy today because she was not feeling well this morning. She is not having abdominal pain or chest pain with this. Pt states she wanted to go to dialysis center, and is confused as to why she was brought to the ER instead. She states when she does not go to dialysis and does not take her BP meds, her BP will get as high as it is today (240s systolic) -- she has been unable to tolerate her PO meds.  Patient is a 79 y.o. female presenting with vomiting. The history is provided by the patient and the EMS personnel.  Emesis Severity:  Mild Timing:  Constant Progression:  Unchanged Chronicity:  New Associated symptoms: no abdominal pain, no chills, no diarrhea and no headaches   Risk factors: no alcohol use     Past Medical History  Diagnosis Date  . Hypertension   . Renal disorder   . TIA (transient ischemic attack)   . Arthritis   . Blood transfusion   . Aneurysm     brain  . DVT (deep venous thrombosis)     Past Surgical History  Procedure Laterality Date  . Cholecystectomy  08/02/2011    Procedure: LAPAROSCOPIC CHOLECYSTECTOMY WITH INTRAOPERATIVE CHOLANGIOGRAM;  Surgeon: Currie Paris, MD;  Location: [facility] OR;  Service: General;  Laterality: N/A;  . Abdominal hysterectomy      No family history on file.  History  Substance Use Topics  . Smoking status: Never Smoker   . Smokeless tobacco: Not on file  . Alcohol Use: No    OB History   Grav Para Term Preterm Abortions TAB SAB Ect Mult Living                  Review of Systems   Constitutional: Negative for fever, chills and fatigue.  HENT: Negative for facial swelling, drooling, neck pain and dental problem.   Eyes: Negative for pain, discharge and itching.  Respiratory: Negative for cough, choking, wheezing and stridor.   Cardiovascular: Negative for chest pain.  Gastrointestinal: Positive for vomiting. Negative for abdominal pain and diarrhea.  Endocrine: Negative for cold intolerance and heat intolerance.  Genitourinary: Negative for vaginal discharge, difficulty urinating and vaginal pain.  Skin: Negative for pallor and rash.  Neurological: Positive for weakness. Negative for dizziness, light-headedness and headaches.  Psychiatric/Behavioral: Negative for behavioral problems and agitation.    Allergies  Review of patient's allergies indicates no known allergies.  Home Medications   Current Outpatient Rx  Name  Route  Sig  Dispense  Refill  . acetaminophen (TYLENOL ARTHRITIS PAIN) 650 MG CR tablet   Oral   Take 650 mg by mouth every 8 (eight) hours as needed. For pain         . amLODipine (NORVASC) 10 MG tablet   Oral   Take 10 mg by mouth 2 (two) times daily.         . calcium acetate (PHOSLO) 667 MG capsule   Oral   Take 667 mg by mouth 3 (three) times daily with meals.         Marland Kitchen  cloNIDine (CATAPRES) 0.1 MG tablet   Oral   Take 0.1 mg by mouth 3 (three) times daily.         . fosinopril (MONOPRIL) 40 MG tablet   Oral   Take 40 mg by mouth daily.         Marland Kitchen HYDROcodone-acetaminophen (VICODIN) 5-500 MG per tablet   Oral   Take 1 tablet by mouth daily as needed. For pain         . multivitamin (RENA-VIT) TABS tablet   Oral   Take 1 tablet by mouth daily.         Marland Kitchen OVER THE COUNTER MEDICATION   Oral   Take 1 tablet by mouth as needed. Hyland's Leg Cramps (homeopathic) and Hyland's Leg Cramps PM (homeopathic) for cramps from dialyis         . simvastatin (ZOCOR) 40 MG tablet   Oral   Take 40 mg by mouth at bedtime. For  cholesterol         . warfarin (COUMADIN) 5 MG tablet   Oral   Take 5-7.5 mg by mouth daily. 1 tablet (5 mg) on Tuesday, 1 1/2 tablet (7.5 mg) all other days.           BP 242/87  Pulse 100  Temp(Src) 98.5 F (36.9 C) (Oral)  Resp 20  SpO2 100%  Physical Exam  Constitutional: She appears well-developed. No distress.  HENT:  Head: Normocephalic and atraumatic.  Eyes: Pupils are equal, round, and reactive to light. Right eye exhibits no discharge. Left eye exhibits no discharge.  Neck: Neck supple. No tracheal deviation present.  Cardiovascular: Normal rate.  Exam reveals no gallop and no friction rub.   Pulmonary/Chest: No stridor. No respiratory distress. She has no wheezes.  Abdominal: Soft. She exhibits no distension. Tenderness: mild epigastric ttp. There is no rebound.  Musculoskeletal: She exhibits no edema and no tenderness.  Neurological: She is alert.  Pt is easily able to have conversation, however, she has trouble remembering president -- but knows her overall disposition.   Skin: Skin is warm. She is not diaphoretic.    ED Course  Procedures (including critical care time)  Labs Reviewed  CBC WITH DIFFERENTIAL - Abnormal; Notable for the following:    Neutrophils Relative % 84 (*)    Lymphocytes Relative 10 (*)    All other components within normal limits  COMPREHENSIVE METABOLIC PANEL - Abnormal; Notable for the following:    Glucose, Bld 116 (*)    BUN 59 (*)    Creatinine, Ser 9.11 (*)    Calcium 10.8 (*)    GFR calc non Af Amer 4 (*)    GFR calc Af Amer 4 (*)    All other components within normal limits  LIPASE, BLOOD - Abnormal; Notable for the following:    Lipase 63 (*)    All other components within normal limits  COMPREHENSIVE METABOLIC PANEL  CBC WITH DIFFERENTIAL   Dg Abd Acute W/chest  11/21/2012   *RADIOLOGY REPORT*  Clinical Data: Vomiting.  ACUTE ABDOMEN SERIES (ABDOMEN 2 VIEW & CHEST 1 VIEW)  Comparison: Chest x-ray 12/09/2011   Findings: There is hyperinflation of the lungs compatible with COPD.  Heart is and borderline in size.  No confluent airspace opacities or effusions.  Stable mild chronic peribronchial thickening.  Prior cholecystectomy.  Calcified phleboliths in the pelvis.  No evidence of bowel obstruction, free air organomegaly.  Degenerative changes in the lumbar spine and hips.  IMPRESSION: No  evidence of bowel obstruction or free air.  Prior cholecystectomy.  COPD.  Chronic bronchitic changes.   Original Report Authenticated By: Charlett Nose, M.D.    ECG shows sinus tachycardia, HR, 104, no peaked T waves, no inverted T waves, qrs similar to prior -- compared to ECG from Feb 17th, 2013.   MDM  Pt with hx of dialysis, did not go to dialysis therapy. ECG does not show peaked T waves. Pt with inability to tolerate PO at home. Denies abdominal pain, and her abd exam is benign. Will get labs, give zofran, and we will try to PO challenge patient. Denies chest pain, sob. Hypertension is most likely secondary to lack of ability to take PO meds and not having dialysis therapy done today.   Pt continues to have nausea that is not controlled in the Er and she is unable to tolerate PO. Found to have elevated lipase, possible pancreatitis. Pt is admitted for further evaluation and care.   1. Emesis   2. Pancreatitis   3. Diabetes mellitus   4. ESRD (end stage renal disease) on dialysis   5. HTN (hypertension)   6. Nausea & vomiting             Bernadene Person, MD 11/21/12 2325

## 2012-11-21 NOTE — ED Notes (Signed)
Attempted to call report to floor 

## 2012-11-21 NOTE — ED Notes (Signed)
admitting MD at bedside

## 2012-11-21 NOTE — ED Notes (Addendum)
Pt BIB EMS for n/v/d that started this AM. Pt unable to go to HD, last HD was Monday and pt did not run full cycle due to cramping. Lt FA graft. BP by EMS 180/100 manual. A/O x4, lives at home alone.

## 2012-11-21 NOTE — H&P (Signed)
[facility] History and Physical  Judy Wolf WUJ:[id number] DOB: [birth year] DOA: 11/21/2012  Referring physician: ER physician. PCP: Judy Ravel, MD   Chief Complaint: Judy Wolf.  HPI: Judy Wolf is a 79 y.o. female with known history of ESRD on hemodialysis, hypertension who was recently admitted this February for cholecystectomy presents with complaints of Judy Wolf since yesterday. Patient states that she has had recurrence episodes of Judy vomiting with abdominal Wolf around the periumbilical area. Denies any fever chills or diarrhea. Acute abdominal series is unremarkable at this time patient has been admitted for further management. Due to her symptoms patient has missed her dialysis today. Patient also was found to be having uncontrolled blood pressure as patient missed her medications today due to the recurrent Judy and vomiting. Patient otherwise denies any chest Wolf or shortness of breath.  Review of Systems: As presented in the history of presenting illness, rest negative.  Past Medical History  Diagnosis Date  . Hypertension   . Renal disorder   . TIA (transient ischemic attack)   . Arthritis   . Blood transfusion   . Aneurysm     brain  . DVT (deep venous thrombosis)    Past Surgical History  Procedure Laterality Date  . Cholecystectomy  08/02/2011    Procedure: LAPAROSCOPIC CHOLECYSTECTOMY WITH INTRAOPERATIVE CHOLANGIOGRAM;  Surgeon: Judy Paris, MD;  Location: [facility] OR;  Service: General;  Laterality: N/A;  . Abdominal hysterectomy     Social History:  reports that she has never smoked. She does not have any smokeless tobacco history on file. She reports that she does not drink alcohol or use illicit drugs. At home. where does patient live-- Can do ADLs. Can patient participate in ADLs?  No Known Allergies  History reviewed. No pertinent family history.    Prior to Admission medications   Medication  Sig Start Date End Date Taking? Authorizing Provider  acetaminophen (TYLENOL ARTHRITIS Wolf) 650 MG CR tablet Take 650 mg by mouth every 8 (eight) hours as needed. For Wolf   Yes Historical Provider, MD  amLODipine (NORVASC) 10 MG tablet Take 10 mg by mouth 2 (two) times daily.   Yes Historical Provider, MD  benazepril (LOTENSIN) 10 MG tablet Take 10 mg by mouth 2 (two) times daily.   Yes Historical Provider, MD  calcium acetate (PHOSLO) 667 MG capsule Take 667 mg by mouth 3 (three) times daily with meals.   Yes Historical Provider, MD  cloNIDine (CATAPRES) 0.1 MG tablet Take 0.2 mg by mouth 2 (two) times daily. For blood pressure   Yes Historical Provider, MD  HYDROcodone-acetaminophen (VICODIN) 5-500 MG per tablet Take 0.5 tablets by mouth daily as needed. For Wolf   Yes Historical Provider, MD   Physical Exam: Filed Vitals:   11/21/12 1850 11/21/12 1900 11/21/12 1930 11/21/12 2041  BP: 196/48 207/53 217/68 253/94  Pulse: 103 102 104   Temp:      TempSrc:      Resp: 25 18 19    SpO2: 99% 99% 98%      General:  Well-developed and nourished.  Eyes: Anicteric no pallor.  ENT: No discharge from ears eyes nose mouth.  Neck: No mass felt.  Cardiovascular: S1-S2 heard.  Respiratory: No rhonchi or crepitations.  Abdomen: Soft nontender bowel sounds present.  Skin: No rash.  Musculoskeletal: No edema.  Psychiatric: Appears normal.  Neurologic: Alert awake oriented to time place and person. Moves all extremities.  Labs on Admission:  Basic Metabolic Panel:  Recent Labs Lab 11/21/12 1722  NA 142  K 4.4  CL 102  CO2 23  GLUCOSE 116*  BUN 59*  CREATININE 9.11*  CALCIUM 10.8*   Liver Function Tests:  Recent Labs Lab 11/21/12 1722  AST 17  ALT 7  ALKPHOS 48  BILITOT 0.4  PROT 7.0  ALBUMIN 3.8    Recent Labs Lab 11/21/12 1722  LIPASE 63*   No results found for this basename: AMMONIA,  in the last 168 hours CBC:  Recent Labs Lab 11/21/12 1722  WBC 8.3   NEUTROABS 7.0  HGB 12.8  HCT 37.4  MCV 89.9  PLT 386   Cardiac Enzymes: No results found for this basename: CKTOTAL, CKMB, CKMBINDEX, TROPONINI,  in the last 168 hours  BNP (last 3 results) No results found for this basename: PROBNP,  in the last 8760 hours CBG: No results found for this basename: GLUCAP,  in the last 168 hours  Radiological Exams on Admission: Dg Abd Acute W/chest  11/21/2012   *RADIOLOGY REPORT*  Clinical Data: Vomiting.  ACUTE ABDOMEN SERIES (ABDOMEN 2 VIEW & CHEST 1 VIEW)  Comparison: Chest x-ray 12/09/2011  Findings: There is hyperinflation of the lungs compatible with COPD.  Heart is and borderline in size.  No confluent airspace opacities or effusions.  Stable mild chronic peribronchial thickening.  Prior cholecystectomy.  Calcified phleboliths in the pelvis.  No evidence of bowel obstruction, free air organomegaly.  Degenerative changes in the lumbar spine and hips.  IMPRESSION: No evidence of bowel obstruction or free air.  Prior cholecystectomy.  COPD.  Chronic bronchitic changes.   Original Report Authenticated By: Charlett Nose, M.D.     Assessment/Plan Principal Problem:   Judy & vomiting Active Problems:   ESRD (end stage renal disease) on dialysis   Diabetes mellitus   Abdominal Wolf   1. Judy vomiting and abdominal Wolf - differential is included diabetic gastroparesis/gastroenteritis. I do not think it is due to uremia as patient has had these symptoms even before missing the dialysis. CT abdomen pelvis has been ordered to check for any definite cause. Patient will be kept n.p.o. with any medications and antiemetics. 2. Accelerated hypertension - probably secondary to missing dialysis and medications and worsened by patient's constant Judy vomiting. Since patient takes clonidine I have placed patient on clonidine patch with when necessary IV hydralazine and labetalol. Closely follow blood pressure trends in the step down. I think blood pressure may  also improve with dialysis. 3. ESRD on hemodialysis Monday Wednesday and Friday - consult nephrology for dialysis. 4. Diabetes mellitus type 2 - since patient is n.p.o. we will check CBG every 4 with sliding-scale coverage. 5. History of PE - off Coumadin last 6 months. 6. Mild hypercalcemia - further recommendations per nephrologist. Follow metabolic panel.    Code Status: Full code.  Family Communication: None.  Disposition Plan: Admit to inpatient.    Kandace Elrod N. [facility] Pager [phone].  If 7PM-7AM, please contact night-coverage [email or web address] Password [email or web address] 11/21/2012, 8:59 PM

## 2012-11-22 ENCOUNTER — Inpatient Hospital Stay (HOSPITAL_COMMUNITY): Payer: Medicare Other

## 2012-11-22 ENCOUNTER — Encounter (HOSPITAL_COMMUNITY): Payer: Self-pay | Admitting: Radiology

## 2012-11-22 DIAGNOSIS — Z86711 Personal history of pulmonary embolism: Secondary | ICD-10-CM | POA: Diagnosis present

## 2012-11-22 DIAGNOSIS — K529 Noninfective gastroenteritis and colitis, unspecified: Secondary | ICD-10-CM | POA: Diagnosis present

## 2012-11-22 DIAGNOSIS — R1110 Vomiting, unspecified: Secondary | ICD-10-CM

## 2012-11-22 DIAGNOSIS — R109 Unspecified abdominal pain: Secondary | ICD-10-CM

## 2012-11-22 DIAGNOSIS — K5289 Other specified noninfective gastroenteritis and colitis: Secondary | ICD-10-CM

## 2012-11-22 LAB — CBC WITH DIFFERENTIAL/PLATELET
Eosinophils Relative: 0 % (ref 0–5)
HCT: 34.7 % — ABNORMAL LOW (ref 36.0–46.0)
Lymphocytes Relative: 10 % — ABNORMAL LOW (ref 12–46)
Lymphs Abs: 0.7 10*3/uL (ref 0.7–4.0)
MCV: 92 fL (ref 78.0–100.0)
Monocytes Absolute: 0.3 10*3/uL (ref 0.1–1.0)
Neutro Abs: 6 10*3/uL (ref 1.7–7.7)
Platelets: 350 10*3/uL (ref 150–400)
RBC: 3.77 MIL/uL — ABNORMAL LOW (ref 3.87–5.11)
WBC: 7 10*3/uL (ref 4.0–10.5)

## 2012-11-22 LAB — GLUCOSE, CAPILLARY
Glucose-Capillary: 106 mg/dL — ABNORMAL HIGH (ref 70–99)
Glucose-Capillary: 116 mg/dL — ABNORMAL HIGH (ref 70–99)
Glucose-Capillary: 126 mg/dL — ABNORMAL HIGH (ref 70–99)
Glucose-Capillary: 140 mg/dL — ABNORMAL HIGH (ref 70–99)

## 2012-11-22 LAB — COMPREHENSIVE METABOLIC PANEL
Albumin: 3.5 g/dL (ref 3.5–5.2)
BUN: 69 mg/dL — ABNORMAL HIGH (ref 6–23)
Creatinine, Ser: 10.04 mg/dL — ABNORMAL HIGH (ref 0.50–1.10)
GFR calc Af Amer: 4 mL/min — ABNORMAL LOW (ref >90)
Total Protein: 6.1 g/dL (ref 6.0–8.3)

## 2012-11-22 MED ORDER — METRONIDAZOLE IN NACL 5-0.79 MG/ML-% IV SOLN: 500.0000 mg | Freq: Three times a day (TID) | INTRAVENOUS | Status: DC

## 2012-11-22 MED ORDER — SODIUM CHLORIDE 0.9 % IV SOLN
3.0000 g | INTRAVENOUS | Status: DC
  Administered 2012-11-24: 3 g via INTRAVENOUS
  Filled 2012-11-22 (×4): qty 3

## 2012-11-22 MED ORDER — LABETALOL HCL 5 MG/ML IV SOLN
10.0000 mg | Freq: Once | INTRAVENOUS | Status: AC
  Administered 2012-11-22: 10 mg via INTRAVENOUS
  Filled 2012-11-22: qty 4

## 2012-11-22 MED ORDER — SODIUM CHLORIDE 0.9 % IV SOLN: 100.0000 mL | INTRAVENOUS | Status: DC | PRN

## 2012-11-22 MED ORDER — LABETALOL HCL 5 MG/ML IV SOLN
20.0000 mg | Freq: Once | INTRAVENOUS | Status: AC
  Administered 2012-11-22: 20 mg via INTRAVENOUS

## 2012-11-22 MED ORDER — HYDRALAZINE HCL 20 MG/ML IJ SOLN
20.0000 mg | INTRAMUSCULAR | Status: DC | PRN
  Administered 2012-11-22 – 2012-11-23 (×5): 20 mg via INTRAVENOUS
  Filled 2012-11-22 (×6): qty 1

## 2012-11-22 MED ORDER — HEPARIN SODIUM (PORCINE) 1000 UNIT/ML DIALYSIS: 1000.0000 [IU] | INTRAMUSCULAR | Status: DC | PRN

## 2012-11-22 MED ORDER — CIPROFLOXACIN IN D5W 400 MG/200ML IV SOLN: 400.0000 mg | Freq: Two times a day (BID) | INTRAVENOUS | Status: DC

## 2012-11-22 MED ORDER — LIDOCAINE HCL (PF) 1 % IJ SOLN: 5.0000 mL | INTRAMUSCULAR | Status: DC | PRN

## 2012-11-22 MED ORDER — SODIUM CHLORIDE 0.9 % IV SOLN: 3.0000 g | Freq: Four times a day (QID) | INTRAVENOUS | Status: DC

## 2012-11-22 MED ORDER — HYDRALAZINE HCL 20 MG/ML IJ SOLN
20.0000 mg | Freq: Once | INTRAMUSCULAR | Status: AC
  Administered 2012-11-22: 20 mg via INTRAVENOUS

## 2012-11-22 MED ORDER — PENTAFLUOROPROP-TETRAFLUOROETH EX AERO: 1.0000 "application " | INHALATION_SPRAY | CUTANEOUS | Status: DC | PRN

## 2012-11-22 MED ORDER — LIDOCAINE-PRILOCAINE 2.5-2.5 % EX CREA: 1.0000 "application " | TOPICAL_CREAM | CUTANEOUS | Status: DC | PRN

## 2012-11-22 MED ORDER — DOXERCALCIFEROL 4 MCG/2ML IV SOLN
2.0000 ug | INTRAVENOUS | Status: DC
  Filled 2012-11-22: qty 2

## 2012-11-22 MED ORDER — ALTEPLASE 2 MG IJ SOLR
2.0000 mg | Freq: Once | INTRAMUSCULAR | Status: DC | PRN
  Filled 2012-11-22: qty 2

## 2012-11-22 MED ORDER — NEPRO/CARBSTEADY PO LIQD: 237.0000 mL | ORAL | Status: DC | PRN

## 2012-11-22 MED ORDER — IOHEXOL 300 MG/ML  SOLN
100.0000 mL | Freq: Once | INTRAMUSCULAR | Status: AC | PRN
  Administered 2012-11-22: 100 mL via INTRAVENOUS

## 2012-11-22 MED ORDER — HEPARIN SODIUM (PORCINE) 1000 UNIT/ML DIALYSIS: 1500.0000 [IU] | Freq: Once | INTRAMUSCULAR | Status: DC

## 2012-11-22 MED ORDER — SODIUM CHLORIDE 0.9 % IV SOLN
3.0000 g | Freq: Once | INTRAVENOUS | Status: AC
  Administered 2012-11-22: 3 g via INTRAVENOUS
  Filled 2012-11-22: qty 3

## 2012-11-22 NOTE — Progress Notes (Signed)
Pt on arrival to unit from ED. BP 216/59. Pt  given Hydralazine 10 mg  & labetalol 10 mg. BP sustained sys >200. No prns available . Notified Donnamarie Poag midlevel. New order received. Will Continue to monitor pt. Pt sleeping , not complaining of any pain.   Notified K.Kirby . Pt BP 194/61. New orders received.  Rechecked BP, BP 195/61 after medications. Notified K.Kirby midlevel.  Will continue to monitor pt.

## 2012-11-22 NOTE — Progress Notes (Signed)
[facility] Progress Note [place] TEAM 1 - Stepdown/ICU TEAM   Judy Wolf ZOX:[id number] DOB: [birth year] DOA: 11/21/2012 PCP: Ailene Ravel, MD  Brief narrative: 79 year old female patient chronic kidney disease on dialysis Monday Wednesday Friday. Last admission was in February of this year where she underwent cholecystectomy. She presented with acute onset of nausea vomiting and abdominal pain that began during dialysis 48 hours prior. The pain had persisted post dialysis along with the other symptoms. She describes abdominal pain around the periumbilical region. No other associated symptoms including melena, bloody stool, hematemesis or diarrhea. Acute abdominal series was unremarkable. Because of her symptoms she did not attempt her dialysis treatment as scheduled. In the emergency department her blood pressure was markedly elevated. She is admitted to not taking her medications.  Assessment/Plan: Active Problems:   Nausea & vomiting/Abdominal pain due to Acute colitis -CT abdomen/pelvis revealed wall thickening and haziness of the ascending and proximal transverse colon, suggesting infectious colitis, inflammatory bowel  disease, or possibly ischemia. -still with significant abdominal pain but no diarrhea, etc. -started on Unasyn -clear liquids for now   CKD (chronic kidney disease) stage V requiring chronic dialysis -Nephrology consulted -HD planned for 6/5 and 6/6    HTN (hypertension)-poorly controlled -suspect multi factorial due to: pain, missed meds and HD -tx underlying causes -catapres patch    Diabetes mellitus type 2, controlled -cont current tx/SSI    History of pulmonary embolism -off Coumadin x 6 months   DVT prophylaxis: SCDs Code Status: Full Family Communication: Patient Disposition Plan: Stepdown Isolation: None Nutritional Status: No apparent deficits appreciated  Consultants: Nephrology  Procedures: None  Antibiotics: Unasyn 6/4  >>>  HPI/Subjective: Patient alert and continues with mild nausea but has abdominal pain primary symptom. Denies hematemesis, melena, bloody stools or diarrhea.   Objective: Blood pressure 243/80, pulse 95, temperature 98.7 F (37.1 C), temperature source Oral, resp. rate 17, height 5\' 2"  (1.575 m), weight 56.2 kg (123 lb 14.4 oz), SpO2 96.00%.  Intake/Output Summary (Last 24 hours) at 11/22/12 1258 Last data filed at 11/22/12 1000  Gross per 24 hour  Intake      3 ml  Output      0 ml  Net      3 ml     Exam: General: No acute respiratory distress Lungs: Clear to auscultation bilaterally without wheezes or crackles, RA Cardiovascular: Regular rate and rhythm without murmur gallop or rub normal S1 and S2, no peripheral edema or JVD Abdomen: Tender esp over LUQ and LLQ, nondistended, soft, bowel sounds positive, no rebound, no ascites, no appreciable mass Musculoskeletal: No significant cyanosis, clubbing of bilateral lower extremities Neurological: Alert and oriented x 3, moves all extremities x 4 without focal neurological deficits, CN 2-12 intact  Scheduled Meds: Scheduled Meds: . [START ON 11/23/2012] ampicillin-sulbactam (UNASYN) IV  3 g Intravenous Q24H  . cloNIDine  0.2 mg Transdermal Weekly  . [START ON 11/23/2012] doxercalciferol  2 mcg Intravenous Q M,W,F-HD  . insulin aspart  0-9 Units Subcutaneous TID WC  . sodium chloride  3 mL Intravenous Q12H  . sodium chloride  3 mL Intravenous Q12H   Continuous Infusions:   Data Reviewed: Basic Metabolic Panel:  Recent Labs Lab 11/21/12 1722 11/22/12 0340  NA 142 141  K 4.4 4.4  CL 102 102  CO2 23 22  GLUCOSE 116* 134*  BUN 59* 69*  CREATININE 9.11* 10.04*  CALCIUM 10.8* 10.4   Liver Function Tests:  Recent Labs Lab 11/21/12 1722  11/22/12 0340  AST 17 14  ALT 7 7  ALKPHOS 48 42  BILITOT 0.4 0.3  PROT 7.0 6.1  ALBUMIN 3.8 3.5    Recent Labs Lab 11/21/12 1722  LIPASE 63*   No results found for this  basename: AMMONIA,  in the last 168 hours CBC:  Recent Labs Lab 11/21/12 1722 11/22/12 0340  WBC 8.3 7.0  NEUTROABS 7.0 6.0  HGB 12.8 11.7*  HCT 37.4 34.7*  MCV 89.9 92.0  PLT 386 350   Cardiac Enzymes: No results found for this basename: CKTOTAL, CKMB, CKMBINDEX, TROPONINI,  in the last 168 hours BNP (last 3 results) No results found for this basename: PROBNP,  in the last 8760 hours CBG:  Recent Labs Lab 11/22/12 0006 11/22/12 0439 11/22/12 0736 11/22/12 1154  GLUCAP 126* 120* 116* 140*    No results found for this or any previous visit (from the past 240 hour(s)).   Studies:  Recent x-ray studies have been reviewed in detail by the Attending Physician  Scheduled Meds:  Reviewed in detail by the Attending Physician   Junious Silk, ANP [facility] Office  [phone] Pager [phone]  **If unable to reach the above provider after paging please contact the Flow Manager @ [phone]  On-Call/Text Page:      [email or web address]      password [email or web address]  If 7PM-7AM, please contact night-coverage [email or web address] Password [email or web address] 11/22/2012, 12:58 PM   LOS: 1 day   I have examined the patient, reviewed the chart and modified the above note which I agree with.   Ladonte Verstraete,MD [phone] 11/22/2012, 4:58 PM

## 2012-11-22 NOTE — Care Management Note (Signed)
    Page 1 of 1   11/22/2012     9:45:50 AM   CARE MANAGEMENT NOTE 11/22/2012  Patient:  Judy Wolf, Judy Wolf   Account Number:  [id number]  Date Initiated:  11/22/2012  Documentation initiated by:  Junius Creamer  Subjective/Objective Assessment:   adm w nausea and vomiting     Action/Plan:   lives alone, pcp dr Burnell Blanks   Anticipated DC Date:     Anticipated DC Plan:        DC Planning Services  CM consult      Choice offered to / List presented to:             Status of service:   Medicare Important Message given?   (If response is "NO", the following Medicare IM given date fields will be blank) Date Medicare IM given:   Date Additional Medicare IM given:    Discharge Disposition:    Per UR Regulation:  Reviewed for med. necessity/level of care/duration of stay  If discussed at Long Length of Stay Meetings, dates discussed:    Comments:

## 2012-11-22 NOTE — ED Provider Notes (Signed)
I saw and evaluated the patient, reviewed the resident's note and I agree with the findings and plan.  The patient presents with complaints with complaints of nausea and vomiting, weakness, and elevated blood pressure.  She has a history of ESRD on HD but was unable to make dialysis today due to her current illness.    On exam, the blood pressure is markedly elevated, but are otherwise stable.  She is afebrile.  The heart is regular rate and rhythm and the lungs are clear.  The abdomen is soft, with mild epigastric ttp.  There is no edema.  The patient presents with nausea and vomiting, hypertension which cause her to miss dialysis.  The workup does not show an emergent need for dialysis, however her blood pressure remains markedly elevated through the ED course.  Whether this is related to volume overload or is the cause of her symptoms, I am unsure.  In consultation with medicine, medications have been given to lower her blood pressure and she will be admitted to [facility].  Geoffery Lyons, MD 11/22/12 [phone]

## 2012-11-22 NOTE — Consult Note (Signed)
Judy Wolf 11/22/2012 Judy Wolf D Requesting Physician:  Dr Butler Denmark  Reason for Consult:  ESRD pt admitted for abd pain and missed HD HPI: The patient is a 79 y.o. year-old with hx of HTN and ESRD on HD at [facility] HD 3x/week.  Patient had recent cholecystectomy in Feb 2014.  She presented with N/V and periumbilical abd pain yesterday evening.  She missed HD 6/4 due to these problems.  In the ED BP was high and she vomited liquids.  Pt was admitted. Feeling a little better today.  Abd xray unremarkable. CT showed wall thickening of ascending and proximal transverse colon c/w infectious, ischemic or inflammatory bowel disease.  ROS  no n/v today  no sob or cp  no fevers  no rash o  no jt pain  no ha or confusion   Past Medical History:  Past Medical History  Diagnosis Date  . Hypertension   . Renal disorder   . TIA (transient ischemic attack)   . Arthritis   . Blood transfusion   . Aneurysm     brain  . DVT (deep venous thrombosis)     Past Surgical History:  Past Surgical History  Procedure Laterality Date  . Cholecystectomy  08/02/2011    Procedure: LAPAROSCOPIC CHOLECYSTECTOMY WITH INTRAOPERATIVE CHOLANGIOGRAM;  Surgeon: Currie Paris, MD;  Location[facility] OR;  Service: General;  Laterality: N/A;  . Abdominal hysterectomy      Family History: History reviewed. No pertinent family history. Social History:  reports that she has never smoked. She does not have any smokeless tobacco history on file. She reports that she does not drink alcohol or use illicit drugs.  Allergies: No Known Allergies  Home medications: Prior to Admission medications   Medication Sig Start Date End Date Taking? Authorizing Provider  acetaminophen (TYLENOL ARTHRITIS PAIN) 650 MG CR tablet Take 650 mg by mouth every 8 (eight) hours as needed. For pain   Yes Historical Provider, MD  amLODipine (NORVASC) 10 MG tablet Take 10 mg by mouth 2 (two) times daily.   Yes Historical Provider, MD   benazepril (LOTENSIN) 10 MG tablet Take 10 mg by mouth 2 (two) times daily.   Yes Historical Provider, MD  calcium acetate (PHOSLO) 667 MG capsule Take 667 mg by mouth 3 (three) times daily with meals.   Yes Historical Provider, MD  cloNIDine (CATAPRES) 0.1 MG tablet Take 0.2 mg by mouth 2 (two) times daily. For blood pressure   Yes Historical Provider, MD  HYDROcodone-acetaminophen (VICODIN) 5-500 MG per tablet Take 0.5 tablets by mouth daily as needed. For pain   Yes Historical Provider, MD    Labs: Liver Function Tests:  Recent Labs Lab 11/21/12 1722 11/22/12 0340  AST 17 14  ALT 7 7  ALKPHOS 48 42  BILITOT 0.4 0.3  PROT 7.0 6.1  ALBUMIN 3.8 3.5    Recent Labs Lab 11/21/12 1722  LIPASE 63*   PT/INR: @LABRCNTIP (inr:5) Cardiac Enzymes: )No results found for this basename: TROPONINI,  in the last 168 hours CBG:  Recent Labs Lab 11/22/12 0006 11/22/12 0439 11/22/12 0736  GLUCAP 126* 120* 116*   CBC  Recent Labs Lab 11/21/12 1722 11/22/12 0340  WBC 8.3 7.0  NEUTROABS 7.0 6.0  HGB 12.8 11.7*  HCT 37.4 34.7*  MCV 89.9 92.0  PLT 386 350   Basic Metabolic Panel:  Recent Labs Lab 11/21/12 1722 11/22/12 0340  NA 142 141  K 4.4 4.4  CL 102 102  CO2 23 22  GLUCOSE 116* 134*  BUN 59* 69*  CREATININE 9.11* 10.04*  CALCIUM 10.8* 10.4    Physical Exam:  Blood pressure 233/70, pulse 96, temperature 99.3 F (37.4 C), temperature source Oral, resp. rate 13, height 5\' 2"  (1.575 m), weight 56.2 kg (123 lb 14.4 oz), SpO2 96.00%. Gen: small framed elderly AAF in no distress HEENT:  EOMI, sclera anicteric, throat clear Neck: moderate JVD, no LAN Chest: crackles L base only, R clear CV: regular, no rub or gallop, no murmur, no carotid or femoral bruits, pedal pulses  Abdomen: soft, nontender, no mass or ascites Ext: no LE or UE edema, no joint effusion or deformity, no gangrene or ulceration Neuro: alert, Ox3, no focal deficit Access: L FA AVF good  bruit  Outpatient HD: [facility] MWF  3hr  F160  400/A1.5   2K/2CA [place]   Profile 4   LFA AVF  58kg EDW   Hect 2ug   EPO 1600   Heparin 1500    Last Tsat 26%, Ferr 450 and PTH  185   Impression/Plan 1. Abd pain / nausea / vomiting- possible ischemic or infectious colitis by CT 2. ESRD, missed HD yesterday. Is below her dry wt, plan HD today and tomorrow. Keep BP over 120 with possible ischemic colitis 3. HTN/volume- no vol excess but BP up. Pt says she ran out of benazepril and clonidine.  Also was on amlodipine at home. Is back on Catapres patch here and has rec'd prn IV meds. DBP is low in 60's, SBP 210 range.  4. Anemia of CKD- Hgb over 11, hold ESA 5. Secondary HPTH- cont vit D, binders (phoslo)   Vinson Moselle  MD [facility] [phone] pgr    [phone] cell 11/22/2012, 11:23 AM

## 2012-11-23 LAB — RENAL FUNCTION PANEL
Albumin: 3.5 g/dL (ref 3.5–5.2)
BUN: 29 mg/dL — ABNORMAL HIGH (ref 6–23)
Creatinine, Ser: 5.82 mg/dL — ABNORMAL HIGH (ref 0.50–1.10)
Phosphorus: 4.7 mg/dL — ABNORMAL HIGH (ref 2.3–4.6)

## 2012-11-23 LAB — CBC
HCT: 34.4 % — ABNORMAL LOW (ref 36.0–46.0)
MCH: 30.2 pg (ref 26.0–34.0)
MCHC: 32.3 g/dL (ref 30.0–36.0)
MCV: 93.5 fL (ref 78.0–100.0)
RDW: 14.2 % (ref 11.5–15.5)

## 2012-11-23 LAB — GLUCOSE, CAPILLARY: Glucose-Capillary: 102 mg/dL — ABNORMAL HIGH (ref 70–99)

## 2012-11-23 MED ORDER — BENAZEPRIL HCL 10 MG PO TABS
10.0000 mg | ORAL_TABLET | Freq: Two times a day (BID) | ORAL | Status: DC
  Administered 2012-11-23 – 2012-11-25 (×4): 10 mg via ORAL
  Filled 2012-11-23 (×6): qty 1

## 2012-11-23 MED ORDER — ALTEPLASE 2 MG IJ SOLR
2.0000 mg | Freq: Once | INTRAMUSCULAR | Status: DC | PRN
  Filled 2012-11-23: qty 2

## 2012-11-23 MED ORDER — DOXERCALCIFEROL 4 MCG/2ML IV SOLN
INTRAVENOUS | Status: AC
  Administered 2012-11-23: 2 ug via INTRAVENOUS
  Filled 2012-11-23: qty 2

## 2012-11-23 MED ORDER — LIDOCAINE HCL (PF) 1 % IJ SOLN: 5.0000 mL | INTRAMUSCULAR | Status: DC | PRN

## 2012-11-23 MED ORDER — HEPARIN SODIUM (PORCINE) 1000 UNIT/ML DIALYSIS
1500.0000 [IU] | Freq: Once | INTRAMUSCULAR | Status: AC
  Administered 2012-11-23: 1500 [IU] via INTRAVENOUS_CENTRAL
  Filled 2012-11-23: qty 2

## 2012-11-23 MED ORDER — LIDOCAINE-PRILOCAINE 2.5-2.5 % EX CREA
1.0000 "application " | TOPICAL_CREAM | CUTANEOUS | Status: DC | PRN
  Filled 2012-11-23: qty 5

## 2012-11-23 MED ORDER — SODIUM CHLORIDE 0.9 % IV SOLN: 100.0000 mL | INTRAVENOUS | Status: DC | PRN

## 2012-11-23 MED ORDER — PENTAFLUOROPROP-TETRAFLUOROETH EX AERO: 1.0000 "application " | INHALATION_SPRAY | CUTANEOUS | Status: DC | PRN

## 2012-11-23 MED ORDER — HEPARIN SODIUM (PORCINE) 1000 UNIT/ML DIALYSIS
1000.0000 [IU] | INTRAMUSCULAR | Status: DC | PRN
  Filled 2012-11-23: qty 1

## 2012-11-23 MED ORDER — CLONIDINE HCL 0.2 MG PO TABS
0.2000 mg | ORAL_TABLET | Freq: Two times a day (BID) | ORAL | Status: DC
  Administered 2012-11-23 – 2012-11-25 (×4): 0.2 mg via ORAL
  Filled 2012-11-23 (×6): qty 1

## 2012-11-23 MED ORDER — NEPRO/CARBSTEADY PO LIQD
237.0000 mL | ORAL | Status: DC | PRN
  Filled 2012-11-23: qty 237

## 2012-11-23 MED ORDER — KETOROLAC TROMETHAMINE 15 MG/ML IJ SOLN
15.0000 mg | Freq: Once | INTRAMUSCULAR | Status: AC
  Administered 2012-11-23: 15 mg via INTRAVENOUS
  Filled 2012-11-23: qty 1

## 2012-11-23 MED ORDER — OXYCODONE-ACETAMINOPHEN 5-325 MG PO TABS
1.0000 | ORAL_TABLET | ORAL | Status: DC | PRN
  Administered 2012-11-23 – 2012-11-24 (×2): 2 via ORAL
  Filled 2012-11-23 (×2): qty 2

## 2012-11-23 MED ORDER — RENA-VITE PO TABS
1.0000 | ORAL_TABLET | Freq: Every day | ORAL | Status: DC
  Administered 2012-11-24: 1 via ORAL
  Filled 2012-11-23 (×3): qty 1

## 2012-11-23 MED ORDER — TRAMADOL HCL 50 MG PO TABS
50.0000 mg | ORAL_TABLET | Freq: Four times a day (QID) | ORAL | Status: DC | PRN
  Administered 2012-11-24: 50 mg via ORAL
  Filled 2012-11-23: qty 1

## 2012-11-23 MED ORDER — AMLODIPINE BESYLATE 10 MG PO TABS
10.0000 mg | ORAL_TABLET | Freq: Two times a day (BID) | ORAL | Status: DC
  Administered 2012-11-23 – 2012-11-25 (×4): 10 mg via ORAL
  Filled 2012-11-23 (×7): qty 1

## 2012-11-23 NOTE — Progress Notes (Signed)
Patient refused medication tonight.   Merri Dimaano RN

## 2012-11-23 NOTE — Progress Notes (Signed)
Subjective:  On hd , abd. Discomfort resolving, tells me tolerated liquids last pm Objective Vital signs in last 24 hours: Filed Vitals:   11/23/12 0830 11/23/12 0857 11/23/12 0923 11/23/12 0937  BP: 230/65 215/58 186/50 160/52  Pulse: 77 85 80 83  Temp:      TempSrc:      Resp: 28 24 23 23   Height:      Weight:      SpO2: 98%      Weight change: 0.7 kg (1 lb 8.7 oz)  Intake/Output Summary (Last 24 hours) at 11/23/12 0956 Last data filed at 11/22/12 1649  Gross per 24 hour  Intake      3 ml  Output    996 ml  Net   -993 ml   Labs: Basic Metabolic Panel:  Recent Labs Lab 11/21/12 1722 11/22/12 0340 11/23/12 0750  NA 142 141 141  K 4.4 4.4 3.6  CL 102 102 100  CO2 23 22 27   GLUCOSE 116* 134* 95  BUN 59* 69* 29*  CREATININE 9.11* 10.04* 5.82*  CALCIUM 10.8* 10.4 9.0  PHOS  --   --  4.7*   Liver Function Tests:  Recent Labs Lab 11/21/12 1722 11/22/12 0340 11/23/12 0750  AST 17 14  --   ALT 7 7  --   ALKPHOS 48 42  --   BILITOT 0.4 0.3  --   PROT 7.0 6.1  --   ALBUMIN 3.8 3.5 3.5    Recent Labs Lab 11/21/12 1722  LIPASE 63*   No results found for this basename: AMMONIA,  in the last 168 hours CBC:  Recent Labs Lab 11/21/12 1722 11/22/12 0340 11/23/12 0750  WBC 8.3 7.0 7.3  NEUTROABS 7.0 6.0  --   HGB 12.8 11.7* 11.1*  HCT 37.4 34.7* 34.4*  MCV 89.9 92.0 93.5  PLT 386 350 327   Cardiac Enzymes: No results found for this basename: CKTOTAL, CKMB, CKMBINDEX, TROPONINI,  in the last 168 hours CBG:  Recent Labs Lab 11/22/12 1154 11/22/12 1825 11/22/12 1950 11/23/12 0013 11/23/12 0356  GLUCAP 140* 117* 106* 102* 95    Iron Studies: No results found for this basename: IRON, TIBC, TRANSFERRIN, FERRITIN,  in the last 72 hours Studies/Results: Ct Abdomen Pelvis W Contrast  11/22/2012   *RADIOLOGY REPORT*  Clinical Data: Abdominal pain  CT ABDOMEN AND PELVIS WITH CONTRAST  Technique:  Multidetector CT imaging of the abdomen and pelvis was  performed following the standard protocol during bolus administration of intravenous contrast.  Contrast: OMNIPAQUE IOHEXOL 300 MG/ML  SOLN  Comparison: 07/27/2011  Findings: Stable band-like opacity in the posterior right lower lobe compatible with scar.  Subsegmental atelectasis at the left base.  Two flash filling lesions in the right lobe of the liver on images 23 and 28 are stable compatible with hemangiomata.  Post cholecystectomy.  Spleen, pancreas, adrenal glands are stable.  Left adrenal nodule on image 20 is stable.  Exophytic lesions from the left kidney are stable dating back to 2011. Several hypodensities are scattered throughout both kidneys which are too small to characterize.  The appendix is not clearly visualized.  There is wall thickening involving the ascending and proximal transverse colon with a hazy appearance suggesting inflammatory change.  No extraluminal bowel gas, pneumatosis, or abscess.  The terminal ileum is grossly within normal limits.  Sigmoid diverticulosis without acute diverticulitis.  Uterus is surgically absent.  Adnexa are within normal limits.  Bladder is distended.  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  There is also severe disease and/or focal occlusion of the right external iliac artery.  Severe atherosclerotic changes of the left iliac arterial system.  No free fluid.  No obvious abnormal adenopathy. Dense degenerative changes in the lumbar spine.  IMPRESSION: There is wall thickening and haziness of the ascending and proximal transverse colon, suggesting infectious colitis, inflammatory bowel disease, or possibly ischemia.  Neoplasm would be less likely.  Post cholecystectomy since the prior study.  Stable chronic findings.   Original Report Authenticated By: Jolaine Click, M.D.   Dg Abd Acute W/chest  11/21/2012   *RADIOLOGY REPORT*  Clinical Data: Vomiting.  ACUTE ABDOMEN SERIES (ABDOMEN 2 VIEW & CHEST 1 VIEW)  Comparison: Chest x-ray 12/09/2011   Findings: There is hyperinflation of the lungs compatible with COPD.  Heart is and borderline in size.  No confluent airspace opacities or effusions.  Stable mild chronic peribronchial thickening.  Prior cholecystectomy.  Calcified phleboliths in the pelvis.  No evidence of bowel obstruction, free air organomegaly.  Degenerative changes in the lumbar spine and hips.  IMPRESSION: No evidence of bowel obstruction or free air.  Prior cholecystectomy.  COPD.  Chronic bronchitic changes.   Original Report Authenticated By: Charlett Nose, M.D.   Medications:   . ampicillin-sulbactam (UNASYN) IV  3 g Intravenous Q24H  . cloNIDine  0.2 mg Transdermal Weekly  . doxercalciferol  2 mcg Intravenous Q M,W,F-HD  . insulin aspart  0-9 Units Subcutaneous TID WC  . sodium chloride  3 mL Intravenous Q12H  . sodium chloride  3 mL Intravenous Q12H   I  have reviewed scheduled and prn medications.  Physical Exam: General: Alert thin, BF NAD on HD Heart: RRR, , No rub , no miurmur Lungs: CTA  bilaterally Abdomen: BS pos, soft ,nontender Extremities: Dialysis Access:  No pedal edema/ patent on hd  L FA AVF   Outpatient HD: [facility] MWF 3hr F160 400/A1.5 2K/2CA Bath Profile 4 LFA AVF 58kg EDW Hect 2ug EPO 1600 Heparin 1500  Last Tsat 26%, Ferr 450 and PTH 185   Impression/Plan  1. Abd pain / nausea / vomiting- possible ischemic or infectious colitis by CT= Discomfort resolved/ on Unasyn / liquids 2. ESRD = MWF ( [facility]), missed WED.She  is below her dry wt, tolerated HD yesterday and and today so far. Keeping BP over 120 with possible ischemic colitis 3. HTN/volume- no vol excess but BP up on admit.  better no e 155/66 on hd and Pt stated she ran out of benazepril and clonidine. Also was on amlodipine at home. Is back on Catapres patch here and has rec'd prn IV meds. Unable to pull fluid today, so dry weight should be set at 57kg 4. Anemia of CKD- Hgb = 12> 11.7 >11.1 holding ESA 5. Secondary HPTH- cont vit D,  binders / phos 4.7  Lenny Pastel, PA-C [facility] [phone] 11/23/2012,9:56 AM  LOS: 2 days   Patient seen and examined.  I agree with plan as above with additions as indicated. Vinson Moselle  MD [phone] pgr    [phone] cell 11/23/2012, 11:05 AM

## 2012-11-23 NOTE — Procedures (Signed)
I was present at this dialysis session. I have reviewed the session itself and made appropriate changes.   Vinson Moselle, MD [employer] 11/22/2012, 2:38 PM

## 2012-11-23 NOTE — Progress Notes (Signed)
Pt blood pressure 194/59 pt given 10mg  prn Labetalol; no change in blood pressure; paged Junious Silk, NP; see new orders for additional medications ordered; will continue to monitor.

## 2012-11-23 NOTE — Procedures (Signed)
I was present at this dialysis session. I have reviewed the session itself and made appropriate changes.   Rob Quentez Lober, MD [facility] 11/23/2012, 9:20 AM   

## 2012-11-23 NOTE — Progress Notes (Signed)
Pt reported called to Unit 6700; All questions answered; will transfer pt care.

## 2012-11-23 NOTE — Progress Notes (Signed)
[facility] Progress Note [place] TEAM 1 - Stepdown/ICU TEAM   Judy Wolf ZOX:[id number] DOB: July 17, 1928 DOA: 11/21/2012 PCP: Ailene Ravel, MD  Brief narrative: 79 year old female patient chronic kidney disease on dialysis Monday Wednesday Friday. Last admission was in February of this year where she underwent cholecystectomy. She presented with acute onset of nausea vomiting and abdominal pain that began during dialysis 48 hours prior. The pain had persisted post dialysis along with the other symptoms. She describes abdominal pain around the periumbilical region. No other associated symptoms including melena, bloody stool, hematemesis or diarrhea. Acute abdominal series was unremarkable. Because of her symptoms she did not attempt her dialysis treatment as scheduled. In the emergency department her blood pressure was markedly elevated. She is admitted to not taking her medications.  Assessment/Plan: Active Problems:   Nausea & vomiting/Abdominal pain due to Acute colitis -CT abdomen/pelvis revealed wall thickening and haziness of the ascending and proximal transverse colon, suggesting infectious colitis, inflammatory bowel  disease, or possibly ischemia. -still with significant abdominal pain but no diarrhea, etc. -started on Unasyn -clear liquids until abdominal cramping resolved- consider advance to low residue diet 6/7 -use Dilaudid for severe pain and add Percocet for moderate pain -will try tramadol as well -may need GI eval if sx's persist   CKD (chronic kidney disease) stage V requiring chronic dialysis -Nephrology consulted -HD planned for 6/5 and 6/6 (usually M-W-F)    HTN (hypertension)-poorly controlled -suspect multi factorial due to: pain, missed meds and HD -tx underlying causes such a [place] -resume home meds PO- dc catapres patch    Diabetes mellitus type 2, controlled -cont current tx/SSI    History of pulmonary embolism -off Coumadin x 6  months   DVT prophylaxis: SCDs Code Status: Full Family Communication: Patient Disposition Plan: Transfer to 6700 Isolation: None Nutritional Status: No apparent deficits appreciated  Consultants: Nephrology  Procedures: None  Antibiotics: Unasyn 6/4 >>>  HPI/Subjective: Patient alert and says nausea resolved but has crampy abdominal pain-no diarrhea  Objective: Blood pressure 212/58, pulse 89, temperature 98.9 F (37.2 C), temperature source Oral, resp. rate 1, height 5\' 2"  (1.575 m), weight 56.4 kg (124 lb 5.4 oz), SpO2 97.00%.  Intake/Output Summary (Last 24 hours) at 11/23/12 1412 Last data filed at 11/23/12 1038  Gross per 24 hour  Intake      0 ml  Output   1906 ml  Net  -1906 ml     Exam: General: No acute respiratory distress Lungs: Clear to auscultation bilaterally without wheezes or crackles, RA Cardiovascular: Regular rate and rhythm without murmur gallop or rub normal S1 and S2, no peripheral edema or JVD Abdomen: Tender but now over right abdomen, nondistended, soft, bowel sounds positive, no rebound, no ascites, no appreciable mass Musculoskeletal: No significant cyanosis, clubbing of bilateral lower extremities Neurological: Alert and oriented x 3, moves all extremities x 4 without focal neurological deficits, CN 2-12 intact  Scheduled Meds: Scheduled Meds: . amLODipine  10 mg Oral BID  . ampicillin-sulbactam (UNASYN) IV  3 g Intravenous Q24H  . benazepril  10 mg Oral BID  . cloNIDine  0.2 mg Oral BID  . doxercalciferol  2 mcg Intravenous Q M,W,F-HD  . insulin aspart  0-9 Units Subcutaneous TID WC  . ketorolac  30 mg Intravenous Once  . multivitamin  1 tablet Oral QHS  . sodium chloride  3 mL Intravenous Q12H  . sodium chloride  3 mL Intravenous Q12H   Continuous Infusions:   Data Reviewed:  Basic Metabolic Panel:  Recent Labs Lab 11/21/12 1722 11/22/12 0340 11/23/12 0750  NA 142 141 141  K 4.4 4.4 3.6  CL 102 102 100  CO2 23 22 27    GLUCOSE 116* 134* 95  BUN 59* 69* 29*  CREATININE 9.11* 10.04* 5.82*  CALCIUM 10.8* 10.4 9.0  PHOS  --   --  4.7*   Liver Function Tests:  Recent Labs Lab 11/21/12 1722 11/22/12 0340 11/23/12 0750  AST 17 14  --   ALT 7 7  --   ALKPHOS 48 42  --   BILITOT 0.4 0.3  --   PROT 7.0 6.1  --   ALBUMIN 3.8 3.5 3.5    Recent Labs Lab 11/21/12 1722  LIPASE 63*   No results found for this basename: AMMONIA,  in the last 168 hours CBC:  Recent Labs Lab 11/21/12 1722 11/22/12 0340 11/23/12 0750  WBC 8.3 7.0 7.3  NEUTROABS 7.0 6.0  --   HGB 12.8 11.7* 11.1*  HCT 37.4 34.7* 34.4*  MCV 89.9 92.0 93.5  PLT 386 350 327   Cardiac Enzymes: No results found for this basename: CKTOTAL, CKMB, CKMBINDEX, TROPONINI,  in the last 168 hours BNP (last 3 results) No results found for this basename: PROBNP,  in the last 8760 hours CBG:  Recent Labs Lab 11/22/12 1825 11/22/12 1950 11/23/12 0013 11/23/12 0356 11/23/12 1129  GLUCAP 117* 106* 102* 95 110*    No results found for this or any previous visit (from the past 240 hour(s)).   Studies:  Recent x-ray studies have been reviewed in detail by the Attending Physician  Scheduled Meds:  Reviewed in detail by the Attending Physician   Junious Silk, ANP [facility] Office  [phone] Pager [phone]  **If unable to reach the above provider after paging please contact the Flow Manager @ [phone]  On-Call/Text Page:      [email or web address]      password [email or web address]  If 7PM-7AM, please contact night-coverage [email or web address] Password [email or web address] 11/23/2012, 2:12 PM   LOS: 2 days   I have examined the patient, reviewed the chart and modified the above note which I agree with.   Paarth Cropper,MD [phone] 11/23/2012, 3:01 PM

## 2012-11-23 NOTE — Progress Notes (Signed)
Pt arrived to unit accompanied by NT and NT Extern. Arrived with 2 bags of clothing, shoes, cane, bag of medications (Calcium Acetate 667mg , empty bottle of Benazepril HCL, empty bottle of Hydrocodon-acetaminophen 5-325, empty bottle of Clonidine HCL, and bottle of Amlodipine Besylate 10 mg), and dentures wrapped in paper towels. Pt arousable, A&O x 3, blanchable excoriation to sacrum. Placed on bed alarm, pt states in too much pain to sign Pt safety plan. Call bell in reach, will continue to monitor.

## 2012-11-24 LAB — GLUCOSE, CAPILLARY
Glucose-Capillary: 99 mg/dL (ref 70–99)
Glucose-Capillary: 104 mg/dL — ABNORMAL HIGH (ref 70–99)

## 2012-11-24 MED ORDER — CALCIUM ACETATE 667 MG PO CAPS
667.0000 mg | ORAL_CAPSULE | Freq: Three times a day (TID) | ORAL | Status: DC
  Administered 2012-11-24 – 2012-11-25 (×5): 667 mg via ORAL
  Filled 2012-11-24 (×6): qty 1

## 2012-11-24 NOTE — Progress Notes (Signed)
Subjective:  Feeling better, tolerating liquids. Cramps yesterday on HD  Objective Vital signs in last 24 hours: Filed Vitals:   11/23/12 1200 11/23/12 1230 11/23/12 1500 11/23/12 1551  BP: 195/57 212/58 145/40 123/42  Pulse:    60  Temp:    98.8 F (37.1 C)  TempSrc:    Oral  Resp:    16  Height:      Weight:      SpO2:    100%   Weight change: -0.5 kg (-1 lb 1.6 oz)  Intake/Output Summary (Last 24 hours) at 11/24/12 0948 Last data filed at 11/23/12 1900  Gross per 24 hour  Intake      0 ml  Output    910 ml  Net   -910 ml   Labs: Basic Metabolic Panel:  Recent Labs Lab 11/21/12 1722 11/22/12 0340 11/23/12 0750  NA 142 141 141  K 4.4 4.4 3.6  CL 102 102 100  CO2 23 22 27   GLUCOSE 116* 134* 95  BUN 59* 69* 29*  CREATININE 9.11* 10.04* 5.82*  CALCIUM 10.8* 10.4 9.0  PHOS  --   --  4.7*   Liver Function Tests:  Recent Labs Lab 11/21/12 1722 11/22/12 0340 11/23/12 0750  AST 17 14  --   ALT 7 7  --   ALKPHOS 48 42  --   BILITOT 0.4 0.3  --   PROT 7.0 6.1  --   ALBUMIN 3.8 3.5 3.5    Recent Labs Lab 11/21/12 1722  LIPASE 63*   No results found for this basename: AMMONIA,  in the last 168 hours CBC:  Recent Labs Lab 11/21/12 1722 11/22/12 0340 11/23/12 0750  WBC 8.3 7.0 7.3  NEUTROABS 7.0 6.0  --   HGB 12.8 11.7* 11.1*  HCT 37.4 34.7* 34.4*  MCV 89.9 92.0 93.5  PLT 386 350 327   Cardiac Enzymes: No results found for this basename: CKTOTAL, CKMB, CKMBINDEX, TROPONINI,  in the last 168 hours CBG:  Recent Labs Lab 11/23/12 0013 11/23/12 0356 11/23/12 1129 11/23/12 1607 11/24/12 0745  GLUCAP 102* 95 110* 95 104*    Iron Studies: No results found for this basename: IRON, TIBC, TRANSFERRIN, FERRITIN,  in the last 72 hours Studies/Results: No results found. Medications:   . amLODipine  10 mg Oral BID  . ampicillin-sulbactam (UNASYN) IV  3 g Intravenous Q24H  . benazepril  10 mg Oral BID  . cloNIDine  0.2 mg Oral BID  .  doxercalciferol  2 mcg Intravenous Q M,W,F-HD  . insulin aspart  0-9 Units Subcutaneous TID WC  . multivitamin  1 tablet Oral QHS  . sodium chloride  3 mL Intravenous Q12H  . sodium chloride  3 mL Intravenous Q12H    Physical Exam:  General: Alert thin, BF NAD  Heart: RRR, , No rub , no miurmur  Lungs: CTA bilaterally  Abdomen: BS pos, soft , R L Quad mildly tender  Extremities: Dialysis Access: No pedal edema/ pos . Bruit  L FA AVF   Outpatient HD: [facility] MWF 3hr F160 400/A1.5 2K/2CA Bath Profile 4 LFA AVF 58kg EDW Hect 2ug EPO 1600 Heparin 1500  Last Tsat 26%, Ferr 450 and PTH 185 Impression/Plan  1. Abd pain / nausea / vomiting- possible ischemic or infectious colitis by CT= Discomfort resolved/ on Unasyn / liquids tolerating 2.  ESRD = MWF ( [facility]), missed WED.She is below her dry wt, some cramping at end of  HD yesterday and may  need to taper up edw with ischemic colitis as vol not a issue now. Try to keep BP over 120 with  ischemic colitis 3..  HTN/volume- no vol excess but BP up on admit. better  123/42 BP this am/ and Pt stated she ran out of benazepril and clonidine. Also was on amlodipine at home. Is back on Catapres 0.2mg  po bid  here and Benazepril 10 mg po bid, an d Amlodipine 10mg  po bid . 4.   Anemia of CKD- Hgb = 12> 11.7 >11.1 holding ESA 5.   Secondary HPTH- cont vit D, binders restart  now  With po intake  / phos 4.7. Ca 9.5  Lenny Pastel, PA-C [facility] [phone] 11/24/2012,9:48 AM  LOS: 3 days   Patient seen and examined.  Agree with assessment and plan as above. Agree will try to keep SBP up, will not lower dry weight at this time with possible ischemic colitis. Vinson Moselle  MD [phone] pgr    [phone] cell 11/24/2012, 2:11 PM

## 2012-11-24 NOTE — Progress Notes (Addendum)
[facility] Progress Note [place] TEAM 1 - Stepdown/ICU TEAM   Judy Wolf WUJ:[id number] DOB: January 16, 1929 DOA: 11/21/2012 PCP: Ailene Ravel, MD  Brief narrative: 79 year old female patient chronic kidney disease on dialysis Monday Wednesday Friday. Last admission was in February of this year where she underwent cholecystectomy. She presented with acute onset of nausea vomiting and abdominal pain that began during dialysis 48 hours prior. The pain had persisted post dialysis along with the other symptoms. She describes abdominal pain around the periumbilical region. No other associated symptoms including melena, bloody stool, hematemesis or diarrhea. Acute abdominal series was unremarkable. Because of her symptoms she did not attempt her dialysis treatment as scheduled. In the emergency department her blood pressure was markedly elevated. She is admitted to not taking her medications.  Assessment/Plan: Active Problems:   Nausea & vomiting/Abdominal pain due to Acute colitis -CT abdomen/pelvis revealed wall thickening and haziness of the ascending and proximal transverse colon, suggesting infectious colitis, inflammatory bowel  disease, or possibly ischemia. -still with significant abdominal pain but no diarrhea, etc. -started on Unasyn -advance to full liquids today and then solids tonight as tolerated-  -use Dilaudid for severe pain and add Percocet for moderate pain -will try tramadol as well   CKD (chronic kidney disease) stage V requiring chronic dialysis -Nephrology consulted -HD planned for 6/5 and 6/6 (usually M-W-F)    HTN (hypertension)-poorly controlled -suspect multi factorial due to: pain, missed meds and HD -tx underlying causes such a [place] -resume home meds PO- dc catapres patch    Diabetes mellitus type 2, controlled -cont current tx/SSI    History of pulmonary embolism -off Coumadin x 6 months   DVT prophylaxis: SCDs Code Status: Full Family  Communication: Patient Disposition Plan: likely home in AM Isolation: None Nutritional Status: No apparent deficits appreciated  Consultants: Nephrology  Procedures: None  Antibiotics: Unasyn 6/4 >>>  HPI/Subjective: Patient alert tolerating diet with decreased abd pain- leg cramping severe last night but now resolved.   Objective: Blood pressure 212/60, pulse 84, temperature 98.6 F (37 C), temperature source Oral, resp. rate 18, height 5\' 2"  (1.575 m), weight 56.4 kg (124 lb 5.4 oz), SpO2 93.00%.  Intake/Output Summary (Last 24 hours) at 11/24/12 1650 Last data filed at 11/24/12 1100  Gross per 24 hour  Intake    120 ml  Output      0 ml  Net    120 ml     Exam: General: No acute respiratory distress Lungs: Clear to auscultation bilaterally without wheezes or crackles, RA Cardiovascular: Regular rate and rhythm without murmur gallop or rub normal S1 and S2, no peripheral edema or JVD Abdomen: Tender (mild) right abdomen, nondistended, soft, bowel sounds positive, no rebound, no ascites, no appreciable mass Musculoskeletal: No significant cyanosis, clubbing of bilateral lower extremities Neurological: Alert and oriented x 3, moves all extremities x 4 without focal neurological deficits, CN 2-12 intact  Scheduled Meds: Scheduled Meds: . amLODipine  10 mg Oral BID  . ampicillin-sulbactam (UNASYN) IV  3 g Intravenous Q24H  . benazepril  10 mg Oral BID  . calcium acetate  667 mg Oral TID WC  . cloNIDine  0.2 mg Oral BID  . doxercalciferol  2 mcg Intravenous Q M,W,F-HD  . insulin aspart  0-9 Units Subcutaneous TID WC  . multivitamin  1 tablet Oral QHS  . sodium chloride  3 mL Intravenous Q12H  . sodium chloride  3 mL Intravenous Q12H   Continuous Infusions:   Data  Reviewed: Basic Metabolic Panel:  Recent Labs Lab 11/21/12 1722 11/22/12 0340 11/23/12 0750  NA 142 141 141  K 4.4 4.4 3.6  CL 102 102 100  CO2 23 22 27   GLUCOSE 116* 134* 95  BUN 59* 69* 29*   CREATININE 9.11* 10.04* 5.82*  CALCIUM 10.8* 10.4 9.0  PHOS  --   --  4.7*   Liver Function Tests:  Recent Labs Lab 11/21/12 1722 11/22/12 0340 11/23/12 0750  AST 17 14  --   ALT 7 7  --   ALKPHOS 48 42  --   BILITOT 0.4 0.3  --   PROT 7.0 6.1  --   ALBUMIN 3.8 3.5 3.5    Recent Labs Lab 11/21/12 1722  LIPASE 63*   No results found for this basename: AMMONIA,  in the last 168 hours CBC:  Recent Labs Lab 11/21/12 1722 11/22/12 0340 11/23/12 0750  WBC 8.3 7.0 7.3  NEUTROABS 7.0 6.0  --   HGB 12.8 11.7* 11.1*  HCT 37.4 34.7* 34.4*  MCV 89.9 92.0 93.5  PLT 386 350 327   Cardiac Enzymes: No results found for this basename: CKTOTAL, CKMB, CKMBINDEX, TROPONINI,  in the last 168 hours BNP (last 3 results) No results found for this basename: PROBNP,  in the last 8760 hours CBG:  Recent Labs Lab 11/23/12 0356 11/23/12 1129 11/23/12 1607 11/24/12 0745 11/24/12 1221  GLUCAP 95 110* 95 104* 99    No results found for this or any previous visit (from the past 240 hour(s)).   Studies:  Recent x-ray studies have been reviewed in detail by the Attending Physician  Scheduled Meds:  Reviewed in detail by the Attending Physician   Calvert Cantor, MD [facility] Office  [phone] Pager [phone]  **If unable to reach the above provider after paging please contact the Flow Manager @ [phone]  On-Call/Text Page:      [email or web address]      password [email or web address]  If 7PM-7AM, please contact night-coverage [email or web address] Password [email or web address] 11/24/2012, 4:50 PM   LOS: 3 days

## 2012-11-24 NOTE — Progress Notes (Signed)
Patient refused Lab draws this morning   Dixie Jafri RN

## 2012-11-25 LAB — BASIC METABOLIC PANEL
Calcium: 9.4 mg/dL (ref 8.4–10.5)
Creatinine, Ser: 7.18 mg/dL — ABNORMAL HIGH (ref 0.50–1.10)
GFR calc non Af Amer: 5 mL/min — ABNORMAL LOW (ref >90)
Glucose, Bld: 98 mg/dL (ref 70–99)
Sodium: 138 mEq/L (ref 135–145)

## 2012-11-25 LAB — GLUCOSE, CAPILLARY
Glucose-Capillary: 104 mg/dL — ABNORMAL HIGH (ref 70–99)
Glucose-Capillary: 91 mg/dL (ref 70–99)

## 2012-11-25 MED ORDER — RENA-VITE PO TABS: 1.0000 | ORAL_TABLET | Freq: Every day | ORAL | Status: AC

## 2012-11-25 MED ORDER — METRONIDAZOLE 500 MG PO TABS: 500.0000 mg | ORAL_TABLET | Freq: Three times a day (TID) | ORAL | Status: DC

## 2012-11-25 MED ORDER — METRONIDAZOLE 500 MG PO TABS
500.0000 mg | ORAL_TABLET | Freq: Three times a day (TID) | ORAL | Status: DC
  Administered 2012-11-25: 500 mg via ORAL
  Filled 2012-11-25 (×3): qty 1

## 2012-11-25 MED ORDER — LEVOFLOXACIN 500 MG PO TABS: 500.0000 mg | ORAL_TABLET | Freq: Every day | ORAL | Status: DC

## 2012-11-25 MED ORDER — LEVOFLOXACIN 500 MG PO TABS
500.0000 mg | ORAL_TABLET | ORAL | Status: DC
  Administered 2012-11-25: 500 mg via ORAL
  Filled 2012-11-25: qty 1

## 2012-11-25 MED ORDER — MUSCLE RUB 10-15 % EX CREA
TOPICAL_CREAM | CUTANEOUS | Status: DC | PRN
  Filled 2012-11-25: qty 85

## 2012-11-25 NOTE — Discharge Summary (Addendum)
Physician Discharge Summary  Judy Wolf ZOX:[id number] DOB: 11/26/1928 DOA: 11/21/2012  PCP: Ailene Ravel, MD  Admit date: 11/21/2012 Discharge date: 11/25/2012  Time spent: 45 minutes  Discharge Diagnoses:  Principal Problem:   Acute colitis Active Problems:   CKD (chronic kidney disease) stage V requiring chronic dialysis   Diabetes mellitus type 2, controlled   HTN (hypertension)-poorly controlled   History of pulmonary embolism   Discharge Condition: stable  Diet recommendation: diabetic, renal - low residue for 5 more days  Filed Weights   11/22/12 1649 11/23/12 0733 11/24/12 2132  Weight: 56.1 kg (123 lb 10.9 oz) 56.4 kg (124 lb 5.4 oz) 54.9 kg (121 lb 0.5 oz)    History of present illness:  79 year old female patient chronic kidney disease on dialysis Monday Wednesday Friday. Last admission was in February of this year where she underwent cholecystectomy. She presented with acute onset of nausea vomiting and abdominal pain that began during dialysis 48 hours prior. The pain had persisted post dialysis along with the other symptoms. She describes abdominal pain around the periumbilical region. No other associated symptoms including melena, bloody stool, hematemesis or diarrhea. Acute abdominal series was unremarkable. Because of her symptoms she did not attempt her dialysis treatment as scheduled. In the emergency department her blood pressure was markedly elevated. She is admitted to not taking her medications.   Hospital Course:  Nausea & vomiting/Abdominal pain due to Acute colitis  -CT abdomen/pelvis revealed wall thickening and haziness of the ascending and proximal transverse colon, suggesting infectious colitis, inflammatory bowel  disease, or possibly ischemia.  -started on Unasyn due to shortage of IV Flagyl - transitioned to Levquin and flagyl on d/c  CKD (chronic kidney disease) stage V requiring chronic dialysis  -Nephrology has managed dialysis  HTN  (hypertension)-poorly controlled  - no med changed made  Diabetes mellitus type 2, controlled   History of pulmonary embolism  -off Coumadin x 6 months    Procedures:  none  Consultations:  nephro  Discharge Exam: Filed Vitals:   11/24/12 2132 11/25/12 0408 11/25/12 0857 11/25/12 1504  BP: 184/55 134/43 179/51 122/42  Pulse: 72 64 72 62  Temp: 98.6 F (37 C) 98.4 F (36.9 C) 97.5 F (36.4 C) 97.7 F (36.5 C)  TempSrc: Oral Oral Oral Oral  Resp: 20 18 18 18   Height:      Weight: 54.9 kg (121 lb 0.5 oz)     SpO2: 97% 96% 100% 100%    General: AAO x 3, no distress Cardiovascular: RRR, no murmurs Respiratory: CTA b/l  GI: non-tender, BS+, Non- distended  Discharge Instructions      Discharge Orders   Future Orders Complete By Expires     Diet - low sodium heart healthy  As directed     Comments:      Renal diet- low residue- avoid fruits, vegetables and whole grains for next 5 days.    Increase activity slowly  As directed         Medication List    STOP taking these medications       TYLENOL ARTHRITIS PAIN 650 MG CR tablet  Generic drug:  acetaminophen      TAKE these medications       amLODipine 10 MG tablet  Commonly known as:  NORVASC  Take 10 mg by mouth 2 (two) times daily.     benazepril 10 MG tablet  Commonly known as:  LOTENSIN  Take 10 mg by mouth 2 (two) times  daily.     calcium acetate 667 MG capsule  Commonly known as:  PHOSLO  Take 667 mg by mouth 3 (three) times daily with meals.     cloNIDine 0.1 MG tablet  Commonly known as:  CATAPRES  Take 0.2 mg by mouth 2 (two) times daily. For blood pressure     HYDROcodone-acetaminophen 5-500 MG per tablet  Commonly known as:  VICODIN  Take 0.5 tablets by mouth daily as needed. For pain     levofloxacin 500 MG tablet  Commonly known as:  LEVAQUIN  Take 1 tablet (500 mg total) by mouth daily.     metroNIDAZOLE 500 MG tablet  Commonly known as:  FLAGYL  Take 1 tablet (500 mg  total) by mouth every 8 (eight) hours.     multivitamin Tabs tablet  Take 1 tablet by mouth at bedtime.       No Known Allergies    The results of significant diagnostics from this hospitalization (including imaging, microbiology, ancillary and laboratory) are listed below for reference.    Significant Diagnostic Studies: Ct Abdomen Pelvis W Contrast  11/22/2012   *RADIOLOGY REPORT*  Clinical Data: Abdominal pain  CT ABDOMEN AND PELVIS WITH CONTRAST  Technique:  Multidetector CT imaging of the abdomen and pelvis was performed following the standard protocol during bolus administration of intravenous contrast.  Contrast: OMNIPAQUE IOHEXOL 300 MG/ML  SOLN  Comparison: 07/27/2011  Findings: Stable band-like opacity in the posterior right lower lobe compatible with scar.  Subsegmental atelectasis at the left base.  Two flash filling lesions in the right lobe of the liver on images 23 and 28 are stable compatible with hemangiomata.  Post cholecystectomy.  Spleen, pancreas, adrenal glands are stable.  Left adrenal nodule on image 20 is stable.  Exophytic lesions from the left kidney are stable dating back to 2011. Several hypodensities are scattered throughout both kidneys which are too small to characterize.  The appendix is not clearly visualized.  There is wall thickening involving the ascending and proximal transverse colon with a hazy appearance suggesting inflammatory change.  No extraluminal bowel gas, pneumatosis, or abscess.  The terminal ileum is grossly within normal limits.  Sigmoid diverticulosis without acute diverticulitis.  Uterus is surgically absent.  Adnexa are within normal limits.  Bladder is distended.  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  There is also severe disease and/or focal occlusion of the right external iliac artery.  Severe atherosclerotic changes of the left iliac arterial system.  No free fluid.  No obvious abnormal adenopathy. Dense degenerative  changes in the lumbar spine.  IMPRESSION: There is wall thickening and haziness of the ascending and proximal transverse colon, suggesting infectious colitis, inflammatory bowel disease, or possibly ischemia.  Neoplasm would be less likely.  Post cholecystectomy since the prior study.  Stable chronic findings.   Original Report Authenticated By: Jolaine Click, M.D.   Dg Abd Acute W/chest  11/21/2012   *RADIOLOGY REPORT*  Clinical Data: Vomiting.  ACUTE ABDOMEN SERIES (ABDOMEN 2 VIEW & CHEST 1 VIEW)  Comparison: Chest x-ray 12/09/2011  Findings: There is hyperinflation of the lungs compatible with COPD.  Heart is and borderline in size.  No confluent airspace opacities or effusions.  Stable mild chronic peribronchial thickening.  Prior cholecystectomy.  Calcified phleboliths in the pelvis.  No evidence of bowel obstruction, free air organomegaly.  Degenerative changes in the lumbar spine and hips.  IMPRESSION: No evidence of bowel obstruction or free air.  Prior cholecystectomy.  COPD.  Chronic bronchitic changes.   Original Report Authenticated By: Charlett Nose, M.D.    Microbiology: No results found for this or any previous visit (from the past 240 hour(s)).   Labs: Basic Metabolic Panel:  Recent Labs Lab 11/21/12 1722 11/22/12 0340 11/23/12 0750 11/25/12 0416  NA 142 141 141 138  K 4.4 4.4 3.6 4.2  CL 102 102 100 99  CO2 23 22 27 29   GLUCOSE 116* 134* 95 98  BUN 59* 69* 29* 44*  CREATININE 9.11* 10.04* 5.82* 7.18*  CALCIUM 10.8* 10.4 9.0 9.4  PHOS  --   --  4.7*  --    Liver Function Tests:  Recent Labs Lab 11/21/12 1722 11/22/12 0340 11/23/12 0750  AST 17 14  --   ALT 7 7  --   ALKPHOS 48 42  --   BILITOT 0.4 0.3  --   PROT 7.0 6.1  --   ALBUMIN 3.8 3.5 3.5    Recent Labs Lab 11/21/12 1722  LIPASE 63*   No results found for this basename: AMMONIA,  in the last 168 hours CBC:  Recent Labs Lab 11/21/12 1722 11/22/12 0340 11/23/12 0750  WBC 8.3 7.0 7.3  NEUTROABS  7.0 6.0  --   HGB 12.8 11.7* 11.1*  HCT 37.4 34.7* 34.4*  MCV 89.9 92.0 93.5  PLT 386 350 327   Cardiac Enzymes: No results found for this basename: CKTOTAL, CKMB, CKMBINDEX, TROPONINI,  in the last 168 hours BNP: BNP (last 3 results) No results found for this basename: PROBNP,  in the last 8760 hours CBG:  Recent Labs Lab 11/24/12 2358 11/25/12 0407 11/25/12 0753 11/25/12 1203 11/25/12 1642  GLUCAP 94 97 93 104* 91       Signed:  Nesanel Aguila  [facility] 11/25/2012, 5:28 PM

## 2012-11-25 NOTE — Progress Notes (Signed)
Subjective:  Feels better, some Right Shin Discomfort since cramps last hd Objective Vital signs in last 24 hours: Filed Vitals:   11/24/12 0935 11/24/12 1706 11/24/12 2132 11/25/12 0408  BP: 212/60 157/45 184/55 134/43  Pulse: 84 76 72 64  Temp: 98.6 F (37 C) 98.5 F (36.9 C) 98.6 F (37 C) 98.4 F (36.9 C)  TempSrc: Oral Oral Oral Oral  Resp: 18 20 20 18   Height:      Weight:   54.9 kg (121 lb 0.5 oz)   SpO2: 93% 100% 97% 96%   Weight change: -1.5 kg (-3 lb 4.9 oz)  Intake/Output Summary (Last 24 hours) at 11/25/12 0836 Last data filed at 11/24/12 1100  Gross per 24 hour  Intake    120 ml  Output      0 ml  Net    120 ml   Labs: Basic Metabolic Panel:  Recent Labs Lab 11/22/12 0340 11/23/12 0750 11/25/12 0416  NA 141 141 138  K 4.4 3.6 4.2  CL 102 100 99  CO2 22 27 29   GLUCOSE 134* 95 98  BUN 69* 29* 44*  CREATININE 10.04* 5.82* 7.18*  CALCIUM 10.4 9.0 9.4  PHOS  --  4.7*  --    Liver Function Tests:  Recent Labs Lab 11/21/12 1722 11/22/12 0340 11/23/12 0750  AST 17 14  --   ALT 7 7  --   ALKPHOS 48 42  --   BILITOT 0.4 0.3  --   PROT 7.0 6.1  --   ALBUMIN 3.8 3.5 3.5    Recent Labs Lab 11/21/12 1722  LIPASE 63*   No results found for this basename: AMMONIA,  in the last 168 hours CBC:  Recent Labs Lab 11/21/12 1722 11/22/12 0340 11/23/12 0750  WBC 8.3 7.0 7.3  NEUTROABS 7.0 6.0  --   HGB 12.8 11.7* 11.1*  HCT 37.4 34.7* 34.4*  MCV 89.9 92.0 93.5  PLT 386 350 327   Cardiac Enzymes: No results found for this basename: CKTOTAL, CKMB, CKMBINDEX, TROPONINI,  in the last 168 hours CBG:  Recent Labs Lab 11/24/12 1702 11/24/12 2128 11/24/12 2358 11/25/12 0407 11/25/12 0753  GLUCAP 104* 93 94 97 93    Iron Studies: No results found for this basename: IRON, TIBC, TRANSFERRIN, FERRITIN,  in the last 72 hours Studies/Results: No results found. Medications:   . amLODipine  10 mg Oral BID  . ampicillin-sulbactam (UNASYN) IV  3  g Intravenous Q24H  . benazepril  10 mg Oral BID  . calcium acetate  667 mg Oral TID WC  . cloNIDine  0.2 mg Oral BID  . doxercalciferol  2 mcg Intravenous Q M,W,F-HD  . insulin aspart  0-9 Units Subcutaneous TID WC  . multivitamin  1 tablet Oral QHS  . sodium chloride  3 mL Intravenous Q12H  . sodium chloride  3 mL Intravenous Q12H    Physical Exam:  General: Alert thin, BF NAD . Appropriate Heart: RRR, , No rub , no miurmur  Lungs: CTA bilaterally  Abdomen: BS pos, soft , Nontender Extremities: Dialysis Access: No pedal edema/ pos . Bruit L FA AVF   Outpatient HD: [facility] MWF 3hr F160 400/A1.5 2K/2CA Bath Profile 4 LFA AVF 58kg EDW Hect 2ug EPO 1600 Heparin 1500  Last Tsat 26%, Ferr 450 and PTH 185   Impression/Plan  1. Abd pain / nausea / vomiting- possible ischemic or infectious colitis by CT= Discomfort resolved/ tolerating Liquids on  IV  Unasyn / Try to   Avoid low bp on HD 2. ESRD = MWF ( [facility]), missed WED.HAD  some  Leg cramping at end of HD Friday and need to taper up edw with ischemic colitis as vol not a issue now. Try to keep BP over 120/ Needs standing wts pre and post hd 3.. HTN/volume- no vol excess but BP up on admit./ Better 134/43  BP this am/ ON ADmit Pt stated she ran out of benazepril and clonidine which  may be some etiology  for  Admit HTN /Also was on amlodipine at home. Is back on Catapres 0.2mg  po bid here and Benazepril 10 mg po bid, an d Amlodipine 10mg  po bid .  4. Anemia of CKD- Hgb = 12> 11.7 >11.1 holding ESA  5. Secondary HPTH- cont vit D, binders restart now With po intake / phos 4.7. Ca 9.5    Lenny Pastel, PA-C [facility] Beeper [phone] 11/25/2012,8:36 AM  LOS: 4 days   Patient seen and examined.  Agree with assessment and plan as above. Would not try to lower EDW further, would set new EDW at 56 kg inpatient. Need to keep BP up, avoid hypotension in setting of suspected ischemic colitis. Vinson Moselle  MD [phone] pgr    [phone] cell 11/25/2012, 11:26 AM

## 2013-06-15 ENCOUNTER — Emergency Department (HOSPITAL_COMMUNITY)
Admission: EM | Admit: 2013-06-15 | Discharge: 2013-06-15 | Disposition: A | Discharge status: Home / Self Care | Payer: Medicare Other | Attending: Emergency Medicine | Admitting: Emergency Medicine

## 2013-06-15 ENCOUNTER — Encounter (HOSPITAL_COMMUNITY): Payer: Self-pay | Admitting: Emergency Medicine

## 2013-06-15 ENCOUNTER — Emergency Department (HOSPITAL_COMMUNITY): Payer: Medicare Other

## 2013-06-15 ENCOUNTER — Other Ambulatory Visit: Payer: Self-pay

## 2013-06-15 DIAGNOSIS — Z9089 Acquired absence of other organs: Secondary | ICD-10-CM | POA: Insufficient documentation

## 2013-06-15 DIAGNOSIS — I10 Essential (primary) hypertension: Secondary | ICD-10-CM | POA: Insufficient documentation

## 2013-06-15 DIAGNOSIS — T754XXA Electrocution, initial encounter: Secondary | ICD-10-CM

## 2013-06-15 DIAGNOSIS — Z86718 Personal history of other venous thrombosis and embolism: Secondary | ICD-10-CM | POA: Insufficient documentation

## 2013-06-15 DIAGNOSIS — Z90710 Acquired absence of both cervix and uterus: Secondary | ICD-10-CM | POA: Insufficient documentation

## 2013-06-15 DIAGNOSIS — I120 Hypertensive chronic kidney disease with stage 5 chronic kidney disease or end stage renal disease: Secondary | ICD-10-CM | POA: Insufficient documentation

## 2013-06-15 DIAGNOSIS — Z8673 Personal history of transient ischemic attack (TIA), and cerebral infarction without residual deficits: Secondary | ICD-10-CM | POA: Insufficient documentation

## 2013-06-15 DIAGNOSIS — M129 Arthropathy, unspecified: Secondary | ICD-10-CM | POA: Insufficient documentation

## 2013-06-15 DIAGNOSIS — R296 Repeated falls: Secondary | ICD-10-CM | POA: Insufficient documentation

## 2013-06-15 DIAGNOSIS — Y93G1 Activity, food preparation and clean up: Secondary | ICD-10-CM | POA: Insufficient documentation

## 2013-06-15 DIAGNOSIS — N186 End stage renal disease: Secondary | ICD-10-CM | POA: Insufficient documentation

## 2013-06-15 DIAGNOSIS — Z79899 Other long term (current) drug therapy: Secondary | ICD-10-CM | POA: Insufficient documentation

## 2013-06-15 DIAGNOSIS — Z992 Dependence on renal dialysis: Secondary | ICD-10-CM | POA: Insufficient documentation

## 2013-06-15 DIAGNOSIS — W868XXA Exposure to other electric current, initial encounter: Secondary | ICD-10-CM | POA: Insufficient documentation

## 2013-06-15 DIAGNOSIS — Y929 Unspecified place or not applicable: Secondary | ICD-10-CM | POA: Insufficient documentation

## 2013-06-15 LAB — CBC WITH DIFFERENTIAL/PLATELET
Basophils Absolute: 0 10*3/uL (ref 0.0–0.1)
Basophils Relative: 1 % (ref 0–1)
Eosinophils Absolute: 0.1 10*3/uL (ref 0.0–0.7)
Eosinophils Relative: 1 % (ref 0–5)
HCT: 37.5 % (ref 36.0–46.0)
Hemoglobin: 12.1 g/dL (ref 12.0–15.0)
Lymphocytes Relative: 10 % — ABNORMAL LOW (ref 12–46)
Lymphs Abs: 0.8 10*3/uL (ref 0.7–4.0)
MCH: 30.6 pg (ref 26.0–34.0)
MCHC: 32.3 g/dL (ref 30.0–36.0)
MCV: 94.9 fL (ref 78.0–100.0)
Monocytes Absolute: 0.7 10*3/uL (ref 0.1–1.0)
Monocytes Relative: 9 % (ref 3–12)
Neutro Abs: 6.1 10*3/uL (ref 1.7–7.7)
Neutrophils Relative %: 80 % — ABNORMAL HIGH (ref 43–77)
Platelets: 430 10*3/uL — ABNORMAL HIGH (ref 150–400)
RBC: 3.95 MIL/uL (ref 3.87–5.11)
RDW: 13.4 % (ref 11.5–15.5)
WBC: 7.7 10*3/uL (ref 4.0–10.5)

## 2013-06-15 LAB — COMPREHENSIVE METABOLIC PANEL
ALT: 14 U/L (ref 0–35)
AST: 24 U/L (ref 0–37)
Albumin: 3.5 g/dL (ref 3.5–5.2)
Alkaline Phosphatase: 56 U/L (ref 39–117)
BUN: 26 mg/dL — ABNORMAL HIGH (ref 6–23)
CO2: 29 mEq/L (ref 19–32)
Calcium: 10 mg/dL (ref 8.4–10.5)
Chloride: 91 mEq/L — ABNORMAL LOW (ref 96–112)
Creatinine, Ser: 6.1 mg/dL — ABNORMAL HIGH (ref 0.50–1.10)
GFR calc Af Amer: 7 mL/min — ABNORMAL LOW (ref >90)
GFR calc non Af Amer: 6 mL/min — ABNORMAL LOW (ref >90)
Glucose, Bld: 89 mg/dL (ref 70–99)
Potassium: 4.2 mEq/L (ref 3.5–5.1)
Sodium: 135 mEq/L (ref 135–145)
Total Bilirubin: 0.3 mg/dL (ref 0.3–1.2)
Total Protein: 7.6 g/dL (ref 6.0–8.3)

## 2013-06-15 LAB — TROPONIN I: Troponin I: <0.3 ng/mL (ref <0.30)

## 2013-06-15 MED ORDER — AMLODIPINE BESYLATE 10 MG PO TABS
10.0000 mg | ORAL_TABLET | Freq: Two times a day (BID) | ORAL | Status: DC
  Administered 2013-06-15: 10 mg via ORAL
  Filled 2013-06-15 (×2): qty 1

## 2013-06-15 MED ORDER — BENAZEPRIL HCL 10 MG PO TABS
10.0000 mg | ORAL_TABLET | Freq: Two times a day (BID) | ORAL | Status: DC
  Administered 2013-06-15: 10 mg via ORAL
  Filled 2013-06-15 (×2): qty 1

## 2013-06-15 MED ORDER — HYDRALAZINE HCL 20 MG/ML IJ SOLN
10.0000 mg | Freq: Once | INTRAMUSCULAR | Status: AC
  Administered 2013-06-15: 10 mg via INTRAVENOUS
  Filled 2013-06-15: qty 1

## 2013-06-15 MED ORDER — CLONIDINE HCL 0.2 MG PO TABS
0.2000 mg | ORAL_TABLET | Freq: Two times a day (BID) | ORAL | Status: DC
  Administered 2013-06-15: 0.2 mg via ORAL
  Filled 2013-06-15: qty 1

## 2013-06-15 NOTE — ED Notes (Signed)
Pt presents to department for evaluation of electrical shock. Was washing dishes, christmas decorative electric candle fell in water, pt states she was shocked, she immediately fell back onto floor. Did not strike head. Denies LOC. Pt is conscious alert and oriented x4. She is hemodialysis patient, received treatment on Friday, but missed treatment on Wednesday. 22g R hand.

## 2013-06-15 NOTE — ED Notes (Signed)
Pt ambulated with assistance. To be discharged home. Encouraged to follow up with PCP. Vital signs stable.

## 2013-06-15 NOTE — ED Provider Notes (Signed)
CSN: [id number]     Arrival date & time 06/15/13  0930 History   First MD Initiated Contact with Patient 06/15/13 0932     Chief Complaint  Patient presents with  . Electric Shock   (Consider location/radiation/quality/duration/timing/severity/associated sxs/prior Treatment) HPI Comments: Patient presents by EMS after an electric shock. She was washing dishes when a decorative electric candle fell into the water and shocked her on her right hand. She fell backwards onto the floor. Denies hitting head or losing consciousness. She is awake, alert and oriented. She denies any chest pain, shortness of breath, abdominal pain, nausea or vomiting. She has chronic discomfort in her left upper quadrant after a surgery. This is unchanged. She denies any focal weakness, numbness or tingling. Last dialysis was yesterday. She did not take her blood pressure medication today.  The history is provided by the patient.    Past Medical History  Diagnosis Date  . Hypertension   . Renal disorder   . TIA (transient ischemic attack)   . Arthritis   . Blood transfusion   . Aneurysm     brain  . DVT (deep venous thrombosis)    Past Surgical History  Procedure Laterality Date  . Cholecystectomy  08/02/2011    Procedure: LAPAROSCOPIC CHOLECYSTECTOMY WITH INTRAOPERATIVE CHOLANGIOGRAM;  Surgeon: Currie Paris, MD;  Location: [facility] OR;  Service: General;  Laterality: N/A;  . Abdominal hysterectomy     No family history on file. History  Substance Use Topics  . Smoking status: Never Smoker   . Smokeless tobacco: Not on file  . Alcohol Use: No   OB History   Grav Para Term Preterm Abortions TAB SAB Ect Mult Living                 Review of Systems  Constitutional: Negative for fever, activity change and appetite change.  Respiratory: Negative for cough, chest tightness and shortness of breath.   Cardiovascular: Negative for chest pain.  Gastrointestinal: Negative for nausea, vomiting and abdominal  pain.  Genitourinary: Negative for dysuria, hematuria, vaginal bleeding and vaginal discharge.  Musculoskeletal: Negative for arthralgias and back pain.  Skin: Negative for rash.  Neurological: Negative for dizziness, weakness, light-headedness and headaches.  A complete 10 system review of systems was obtained and all systems are negative except as noted in the HPI and PMH.    Allergies  Review of patient's allergies indicates no known allergies.  Home Medications   Current Outpatient Rx  Name  Route  Sig  Dispense  Refill  . amLODipine (NORVASC) 10 MG tablet   Oral   Take 10 mg by mouth 2 (two) times daily.         . benazepril (LOTENSIN) 40 MG tablet   Oral   Take 40 mg by mouth 2 (two) times daily.         . calcium acetate (PHOSLO) 667 MG capsule   Oral   Take 667 mg by mouth 3 (three) times daily with meals.         . cloNIDine (CATAPRES) 0.1 MG tablet   Oral   Take 0.2 mg by mouth 2 (two) times daily.         Marland Kitchen HYDROcodone-acetaminophen (VICODIN) 5-500 MG per tablet   Oral   Take 0.5 tablets by mouth daily as needed. For pain         . multivitamin (RENA-VIT) TABS tablet   Oral   Take 1 tablet by mouth at bedtime.   30  each   0    BP 184/52  Pulse 87  Temp(Src) 98.7 F (37.1 C) (Oral)  Resp 18  SpO2 99% Physical Exam  Constitutional: She is oriented to person, place, and time. She appears well-developed and well-nourished. No distress.  HENT:  Head: Normocephalic and atraumatic.  Mouth/Throat: Oropharynx is clear and moist. No oropharyngeal exudate.  Eyes: Conjunctivae and EOM are normal. Pupils are equal, round, and reactive to light.  Neck: Normal range of motion. Neck supple.  Cardiovascular: Normal rate, regular rhythm and normal heart sounds.   Pulmonary/Chest: Effort normal and breath sounds normal. No respiratory distress.  Abdominal: Soft. There is no tenderness. There is no rebound and no guarding.  Musculoskeletal: Normal range of  motion. She exhibits tenderness. She exhibits no edema.  Left upper extremity fistula with thrill and bruit TTP lumbar spine.  Neurological: She is alert and oriented to person, place, and time. No cranial nerve deficit. She exhibits normal muscle tone. Coordination normal.  CN 2-12 intact, no ataxia on finger to nose, no nystagmus, 5/5 strength throughout, no pronator drift, Romberg negative, normal gait.   Skin: Skin is warm.    ED Course  Procedures (including critical care time) Labs Review Labs Reviewed  CBC WITH DIFFERENTIAL - Abnormal; Notable for the following:    Platelets 430 (*)    Neutrophils Relative % 80 (*)    Lymphocytes Relative 10 (*)    All other components within normal limits  COMPREHENSIVE METABOLIC PANEL - Abnormal; Notable for the following:    Chloride 91 (*)    BUN 26 (*)    Creatinine, Ser 6.10 (*)    GFR calc non Af Amer 6 (*)    GFR calc Af Amer 7 (*)    All other components within normal limits  TROPONIN I  TROPONIN I   Imaging Review Dg Chest 2 View  06/15/2013   CLINICAL DATA:  Recent electric shock  EXAM: CHEST  2 VIEW  COMPARISON:  December 09, 2011  FINDINGS: There is underlying emphysematous change. There is no edema or consolidation. Heart is borderline prominent with normal pulmonary vascularity. There is extensive atherosclerotic change throughout the aorta. There is arthropathy in the shoulders. No acute fracture seen. No pneumothorax.  IMPRESSION: Underlying emphysema. No edema or consolidation. Extensive atherosclerotic change.   Electronically Signed   By: Bretta Bang M.D.   On: 06/15/2013 10:38   Dg Lumbar Spine Complete  06/15/2013   CLINICAL DATA:  Status post electric shock and subsequent fall.  EXAM: LUMBAR SPINE - COMPLETE 4+ VIEW  COMPARISON:  None.  FINDINGS: The lumbar vertebral bodies are preserved in height. There is disc space narrowing at multiple levels. There is no spondylolisthesis. As best as can be determined the the  facets are intact. The pedicles and transverse processes are poorly defined due to overlying gas and stool but no definite acute abnormality is demonstrated. There is dense calcification in the wall of the left common iliac artery. The observed portions of the sacrum are grossly normal.  IMPRESSION: The images are limited due to overlying soft tissues and the patient's body habitus. No compression fracture or spondylolisthesis is demonstrated. There is significant degenerative disc change at multiple mid and lower lumbar levels.   Electronically Signed   By: David  [place]   On: 06/15/2013 14:03   Ct Head Wo Contrast  06/15/2013   CLINICAL DATA:  Possible electric shock injury  EXAM: CT HEAD WITHOUT CONTRAST  TECHNIQUE: Contiguous axial  images were obtained from the base of the skull through the vertex without intravenous contrast.  COMPARISON:  CT scan of the brain dated Nov 17, 2009.  FINDINGS: There is mild diffuse cerebral and cerebellar atrophy with compensatory ventriculomegaly. There is dense basal ganglia calcification bilaterally. There is decreased density in the deep white matter of both cerebral hemispheres consistent with chronic small vessel ischemic type change. There is an area of encephalomalacia adjacent to the lateral aspect of the posterior horn of the right lateral ventricle which is not new. There is calcification of the intracranial arteries.  At bone window settings the observed portions of the paranasal sinuses and mastoid air cells are clear. There is no evidence of an acute skull fracture.  IMPRESSION: 1. There is no acute ischemic or hemorrhagic process within the brain. 2. There are findings consistent with chronic small vessel ischemic type change. Mild diffuse cerebral and cerebellar atrophy with compensatory ventriculomegaly is present. 3. Intracranial atherosclerotic changes are present.   Electronically Signed   By: David  [place]   On: 06/15/2013 10:44    EKG Interpretation    None       MDM   1. Electric shock, initial encounter   2. Hypertension    Electrical shock from outlet. No loss of consciousness. No chest pain or shortness of breath.  EKG T wave inversions reviewed with Dr. Jens Som cardiology who feels that likely represent LVH with early repolarization. Patient has not had any angina type chest pain or shortness of breath.  Patient given her usual blood pressure medications. Blood pressure improved to 171/70. Chest x-ray is negative. CT head is negative.  Patient monitored in the ED with no arrhythmias an EKG. Lumbar xray was negative for acute pathology. She is able to ambulate and tolerating PO.     Date: 06/15/2013  Rate: 96  Rhythm: normal sinus rhythm  QRS Axis: normal  Intervals: normal  ST/T Wave abnormalities: nonspecific ST/T changes  Conduction Disutrbances:none  Narrative Interpretation: Lateral T wave inversions as previously and 2013, consistent with LVH  Old EKG Reviewed: unchanged  BP 184/52  Pulse 87  Temp(Src) 98.7 F (37.1 C) (Oral)  Resp 18  SpO2 99%   Glynn Octave, MD 06/15/13 1819

## 2013-06-15 NOTE — ED Notes (Signed)
Pt undressed, in gown, on monitor, continuous pulse oximetry and blood pressure cuff 

## 2013-06-15 NOTE — ED Notes (Signed)
Pt states she was shocked by electric candle while washing dishes. States candle fell into water and she was shocked, states she fell back onto ground. Denies pain at the time. Hypertensive upon arrival to ED. Pt is alert and oriented x4.

## 2013-06-20 ENCOUNTER — Emergency Department (HOSPITAL_COMMUNITY): Payer: Medicare Other

## 2013-06-20 ENCOUNTER — Emergency Department (HOSPITAL_COMMUNITY)
Admission: EM | Admit: 2013-06-20 | Discharge: 2013-06-21 | Disposition: A | Discharge status: Home / Self Care | Payer: Medicare Other | Attending: Emergency Medicine | Admitting: Emergency Medicine

## 2013-06-20 ENCOUNTER — Encounter (HOSPITAL_COMMUNITY): Payer: Self-pay | Admitting: Emergency Medicine

## 2013-06-20 DIAGNOSIS — R531 Weakness: Secondary | ICD-10-CM

## 2013-06-20 DIAGNOSIS — R209 Unspecified disturbances of skin sensation: Secondary | ICD-10-CM | POA: Insufficient documentation

## 2013-06-20 DIAGNOSIS — M129 Arthropathy, unspecified: Secondary | ICD-10-CM | POA: Insufficient documentation

## 2013-06-20 DIAGNOSIS — Z79899 Other long term (current) drug therapy: Secondary | ICD-10-CM | POA: Insufficient documentation

## 2013-06-20 DIAGNOSIS — R609 Edema, unspecified: Secondary | ICD-10-CM | POA: Insufficient documentation

## 2013-06-20 DIAGNOSIS — Z86718 Personal history of other venous thrombosis and embolism: Secondary | ICD-10-CM | POA: Insufficient documentation

## 2013-06-20 DIAGNOSIS — N19 Unspecified kidney failure: Secondary | ICD-10-CM

## 2013-06-20 DIAGNOSIS — R5383 Other fatigue: Principal | ICD-10-CM

## 2013-06-20 DIAGNOSIS — N186 End stage renal disease: Secondary | ICD-10-CM | POA: Insufficient documentation

## 2013-06-20 DIAGNOSIS — I120 Hypertensive chronic kidney disease with stage 5 chronic kidney disease or end stage renal disease: Secondary | ICD-10-CM | POA: Insufficient documentation

## 2013-06-20 DIAGNOSIS — R5381 Other malaise: Secondary | ICD-10-CM | POA: Insufficient documentation

## 2013-06-20 DIAGNOSIS — Z992 Dependence on renal dialysis: Secondary | ICD-10-CM | POA: Insufficient documentation

## 2013-06-20 DIAGNOSIS — Z8639 Personal history of other endocrine, nutritional and metabolic disease: Secondary | ICD-10-CM | POA: Insufficient documentation

## 2013-06-20 DIAGNOSIS — Z8673 Personal history of transient ischemic attack (TIA), and cerebral infarction without residual deficits: Secondary | ICD-10-CM | POA: Insufficient documentation

## 2013-06-20 DIAGNOSIS — Z862 Personal history of diseases of the blood and blood-forming organs and certain disorders involving the immune mechanism: Secondary | ICD-10-CM | POA: Insufficient documentation

## 2013-06-20 LAB — COMPREHENSIVE METABOLIC PANEL
ALBUMIN: 3.1 g/dL — AB (ref 3.5–5.2)
ALT: 11 U/L (ref 0–35)
AST: 16 U/L (ref 0–37)
Alkaline Phosphatase: 48 U/L (ref 39–117)
BILIRUBIN TOTAL: 0.4 mg/dL (ref 0.3–1.2)
BUN: 44 mg/dL — ABNORMAL HIGH (ref 6–23)
CHLORIDE: 93 meq/L — AB (ref 96–112)
CO2: 29 meq/L (ref 19–32)
Calcium: 9.8 mg/dL (ref 8.4–10.5)
Creatinine, Ser: 7.38 mg/dL — ABNORMAL HIGH (ref 0.50–1.10)
GFR calc Af Amer: 5 mL/min — ABNORMAL LOW (ref >90)
GFR, EST NON AFRICAN AMERICAN: 5 mL/min — AB (ref >90)
Glucose, Bld: 106 mg/dL — ABNORMAL HIGH (ref 70–99)
Potassium: 4.7 mEq/L (ref 3.7–5.3)
SODIUM: 136 meq/L — AB (ref 137–147)
Total Protein: 6.5 g/dL (ref 6.0–8.3)

## 2013-06-20 LAB — CBC WITH DIFFERENTIAL/PLATELET
BASOS PCT: 0 % (ref 0–1)
Basophils Absolute: 0 10*3/uL (ref 0.0–0.1)
EOS PCT: 3 % (ref 0–5)
Eosinophils Absolute: 0.3 10*3/uL (ref 0.0–0.7)
HCT: 30.3 % — ABNORMAL LOW (ref 36.0–46.0)
Hemoglobin: 9.9 g/dL — ABNORMAL LOW (ref 12.0–15.0)
Lymphocytes Relative: 14 % (ref 12–46)
Lymphs Abs: 1.1 10*3/uL (ref 0.7–4.0)
MCH: 31.1 pg (ref 26.0–34.0)
MCHC: 32.7 g/dL (ref 30.0–36.0)
MCV: 95.3 fL (ref 78.0–100.0)
Monocytes Absolute: 0.7 10*3/uL (ref 0.1–1.0)
Monocytes Relative: 9 % (ref 3–12)
NEUTROS PCT: 74 % (ref 43–77)
Neutro Abs: 6 10*3/uL (ref 1.7–7.7)
PLATELETS: 382 10*3/uL (ref 150–400)
RBC: 3.18 MIL/uL — ABNORMAL LOW (ref 3.87–5.11)
RDW: 13.3 % (ref 11.5–15.5)
WBC: 8 10*3/uL (ref 4.0–10.5)

## 2013-06-20 LAB — URINALYSIS, ROUTINE W REFLEX MICROSCOPIC
Bilirubin Urine: NEGATIVE
Glucose, UA: NEGATIVE mg/dL
Hgb urine dipstick: NEGATIVE
KETONES UR: NEGATIVE mg/dL
Leukocytes, UA: NEGATIVE
Nitrite: NEGATIVE
Protein, ur: 100 mg/dL — AB
Specific Gravity, Urine: 1.013 (ref 1.005–1.030)
UROBILINOGEN UA: 0.2 mg/dL (ref 0.0–1.0)
pH: 8 (ref 5.0–8.0)

## 2013-06-20 LAB — URINE MICROSCOPIC-ADD ON

## 2013-06-20 NOTE — Discharge Instructions (Signed)
Weakness  Weakness is a lack of strength. It may be felt all over the body (generalized) or in one specific part of the body (focal). Some causes of weakness can be serious. You may need further medical evaluation, especially if you are elderly or you have a history of immunosuppression (such as chemotherapy or HIV), kidney disease, heart disease, or diabetes.  CAUSES   Weakness can be caused by many different things, including:  · Infection.  · Physical exhaustion.  · Internal bleeding or other blood loss that results in a lack of red blood cells (anemia).  · Dehydration. This cause is more common in elderly people.  · Side effects or electrolyte abnormalities from medicines, such as pain medicines or sedatives.  · Emotional distress, anxiety, or depression.  · Circulation problems, especially severe peripheral arterial disease.  · Heart disease, such as rapid atrial fibrillation, bradycardia, or heart failure.  · Nervous system disorders, such as Guillain-Barré syndrome, multiple sclerosis, or stroke.  DIAGNOSIS   To find the cause of your weakness, your caregiver will take your history and perform a physical exam. Lab tests or X-rays may also be ordered, if needed.  TREATMENT   Treatment of weakness depends on the cause of your symptoms and can vary greatly.  HOME CARE INSTRUCTIONS   · Rest as needed.  · Eat a well-balanced diet.  · Try to get some exercise every day.  · Only take over-the-counter or prescription medicines as directed by your caregiver.  SEEK MEDICAL CARE IF:   · Your weakness seems to be getting worse or spreads to other parts of your body.  · You develop new aches or pains.  SEEK IMMEDIATE MEDICAL CARE IF:   · You cannot perform your normal daily activities, such as getting dressed and feeding yourself.  · You cannot walk up and down stairs, or you feel exhausted when you do so.  · You have shortness of breath or chest pain.  · You have difficulty moving parts of your body.  · You have weakness  in only one area of the body or on only one side of the body.  · You have a fever.  · You have trouble speaking or swallowing.  · You cannot control your bladder or bowel movements.  · You have black or bloody vomit or stools.  MAKE SURE YOU:  · Understand these instructions.  · Will watch your condition.  · Will get help right away if you are not doing well or get worse.  Document Released: 06/06/2005 Document Revised: 12/06/2011 Document Reviewed: 08/05/2011  Exit[employer]® Patient Information ©2014 [employer].

## 2013-06-20 NOTE — ED Notes (Signed)
pts daughter at bedside, states that she gave pt her BP meds and Tylenol before EMS arrived.
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
## 2013-06-21 MED ORDER — CLONIDINE HCL 0.1 MG PO TABS
0.1000 mg | ORAL_TABLET | Freq: Once | ORAL | Status: AC
  Administered 2013-06-21: 0.1 mg via ORAL
  Filled 2013-06-21: qty 1
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
## 2013-06-21 NOTE — ED Notes (Signed)
Dr Radford PaxBeaton in room discussing plan with pts daughter

## 2013-06-28 ENCOUNTER — Encounter (HOSPITAL_COMMUNITY): Payer: Self-pay | Admitting: *Deleted

## 2013-06-28 ENCOUNTER — Inpatient Hospital Stay (HOSPITAL_COMMUNITY)
Admission: AD | Admit: 2013-06-28 | Discharge: 2013-07-02 | DRG: 312 | Disposition: A | Discharge status: Skilled Nursing Facility | Payer: Medicare Other | Source: Other Acute Inpatient Hospital | Attending: Internal Medicine | Admitting: Internal Medicine

## 2013-06-28 DIAGNOSIS — K529 Noninfective gastroenteritis and colitis, unspecified: Secondary | ICD-10-CM

## 2013-06-28 DIAGNOSIS — G459 Transient cerebral ischemic attack, unspecified: Secondary | ICD-10-CM

## 2013-06-28 DIAGNOSIS — Z79899 Other long term (current) drug therapy: Secondary | ICD-10-CM

## 2013-06-28 DIAGNOSIS — I10 Essential (primary) hypertension: Secondary | ICD-10-CM | POA: Diagnosis present

## 2013-06-28 DIAGNOSIS — R5381 Other malaise: Secondary | ICD-10-CM | POA: Diagnosis present

## 2013-06-28 DIAGNOSIS — Z8673 Personal history of transient ischemic attack (TIA), and cerebral infarction without residual deficits: Secondary | ICD-10-CM

## 2013-06-28 DIAGNOSIS — R509 Fever, unspecified: Secondary | ICD-10-CM | POA: Diagnosis present

## 2013-06-28 DIAGNOSIS — I953 Hypotension of hemodialysis: Principal | ICD-10-CM | POA: Diagnosis present

## 2013-06-28 DIAGNOSIS — E119 Type 2 diabetes mellitus without complications: Secondary | ICD-10-CM

## 2013-06-28 DIAGNOSIS — M62838 Other muscle spasm: Secondary | ICD-10-CM

## 2013-06-28 DIAGNOSIS — D649 Anemia, unspecified: Secondary | ICD-10-CM | POA: Diagnosis present

## 2013-06-28 DIAGNOSIS — Y841 Kidney dialysis as the cause of abnormal reaction of the patient, or of later complication, without mention of misadventure at the time of the procedure: Secondary | ICD-10-CM | POA: Diagnosis present

## 2013-06-28 DIAGNOSIS — R55 Syncope and collapse: Secondary | ICD-10-CM | POA: Diagnosis present

## 2013-06-28 DIAGNOSIS — I120 Hypertensive chronic kidney disease with stage 5 chronic kidney disease or end stage renal disease: Secondary | ICD-10-CM | POA: Diagnosis present

## 2013-06-28 DIAGNOSIS — Z992 Dependence on renal dialysis: Secondary | ICD-10-CM | POA: Diagnosis present

## 2013-06-28 DIAGNOSIS — M899 Disorder of bone, unspecified: Secondary | ICD-10-CM | POA: Diagnosis present

## 2013-06-28 DIAGNOSIS — N186 End stage renal disease: Secondary | ICD-10-CM | POA: Diagnosis present

## 2013-06-28 DIAGNOSIS — E873 Alkalosis: Secondary | ICD-10-CM | POA: Diagnosis present

## 2013-06-28 DIAGNOSIS — Z86718 Personal history of other venous thrombosis and embolism: Secondary | ICD-10-CM

## 2013-06-28 DIAGNOSIS — R112 Nausea with vomiting, unspecified: Secondary | ICD-10-CM

## 2013-06-28 DIAGNOSIS — N390 Urinary tract infection, site not specified: Secondary | ICD-10-CM | POA: Diagnosis present

## 2013-06-28 DIAGNOSIS — Z86711 Personal history of pulmonary embolism: Secondary | ICD-10-CM

## 2013-06-28 DIAGNOSIS — R109 Unspecified abdominal pain: Secondary | ICD-10-CM

## 2013-06-28 DIAGNOSIS — K8020 Calculus of gallbladder without cholecystitis without obstruction: Secondary | ICD-10-CM

## 2013-06-28 DIAGNOSIS — B370 Candidal stomatitis: Secondary | ICD-10-CM

## 2013-06-28 DIAGNOSIS — M949 Disorder of cartilage, unspecified: Secondary | ICD-10-CM

## 2013-06-28 MED ORDER — SODIUM CHLORIDE 0.9 % IJ SOLN: 3.0000 mL | INTRAMUSCULAR | Status: DC | PRN

## 2013-06-28 MED ORDER — CALCIUM ACETATE 667 MG PO CAPS
667.0000 mg | ORAL_CAPSULE | Freq: Three times a day (TID) | ORAL | Status: DC
  Administered 2013-06-29 – 2013-07-02 (×9): 667 mg via ORAL
  Filled 2013-06-28 (×13): qty 1

## 2013-06-28 MED ORDER — SODIUM CHLORIDE 0.9 % IV SOLN: 250.0000 mL | INTRAVENOUS | Status: DC | PRN

## 2013-06-28 MED ORDER — AMLODIPINE BESYLATE 10 MG PO TABS
10.0000 mg | ORAL_TABLET | Freq: Two times a day (BID) | ORAL | Status: DC
  Administered 2013-06-28 – 2013-07-02 (×8): 10 mg via ORAL
  Filled 2013-06-28 (×9): qty 1

## 2013-06-28 MED ORDER — SODIUM CHLORIDE 0.9 % IJ SOLN
3.0000 mL | Freq: Two times a day (BID) | INTRAMUSCULAR | Status: DC
  Administered 2013-06-28 – 2013-06-30 (×5): 3 mL via INTRAVENOUS

## 2013-06-28 MED ORDER — HEPARIN SODIUM (PORCINE) 5000 UNIT/ML IJ SOLN
5000.0000 [IU] | Freq: Three times a day (TID) | INTRAMUSCULAR | Status: DC
  Administered 2013-06-28 – 2013-07-02 (×12): 5000 [IU] via SUBCUTANEOUS
  Filled 2013-06-28 (×14): qty 1

## 2013-06-28 MED ORDER — CLONIDINE HCL 0.2 MG PO TABS
0.2000 mg | ORAL_TABLET | Freq: Two times a day (BID) | ORAL | Status: DC
  Administered 2013-06-28 – 2013-07-02 (×8): 0.2 mg via ORAL
  Filled 2013-06-28 (×9): qty 1

## 2013-06-28 MED ORDER — RENA-VITE PO TABS
1.0000 | ORAL_TABLET | Freq: Every day | ORAL | Status: DC
  Administered 2013-06-28 – 2013-07-01 (×4): 1 via ORAL
  Filled 2013-06-28 (×5): qty 1

## 2013-06-28 MED ORDER — ACETAMINOPHEN 325 MG PO TABS: 650.0000 mg | ORAL_TABLET | Freq: Four times a day (QID) | ORAL | Status: DC | PRN

## 2013-06-28 MED ORDER — HYDRALAZINE HCL 20 MG/ML IJ SOLN
10.0000 mg | INTRAMUSCULAR | Status: DC | PRN
  Administered 2013-06-28 – 2013-06-30 (×5): 10 mg via INTRAVENOUS
  Filled 2013-06-28 (×5): qty 1

## 2013-06-28 MED ORDER — BENAZEPRIL HCL 40 MG PO TABS
40.0000 mg | ORAL_TABLET | Freq: Two times a day (BID) | ORAL | Status: DC
  Administered 2013-06-28 – 2013-07-02 (×8): 40 mg via ORAL
  Filled 2013-06-28 (×9): qty 1

## 2013-06-28 MED ORDER — ACETAMINOPHEN 325 MG PO TABS
650.0000 mg | ORAL_TABLET | Freq: Four times a day (QID) | ORAL | Status: DC | PRN
  Administered 2013-06-28 – 2013-07-01 (×6): 650 mg via ORAL
  Filled 2013-06-28 (×5): qty 2

## 2013-06-28 MED ORDER — SODIUM CHLORIDE 0.9 % IJ SOLN: 3.0000 mL | Freq: Two times a day (BID) | INTRAMUSCULAR | Status: DC

## 2013-06-28 NOTE — H&P (Addendum)
[facility] History and Physical  Judy FullerHarris M Stjames WUJ:[id number] DOB: 10-07-1928 DOA: 06/28/2013  Referring physician: EDP PCP: Ailene RavelHAMRICK,MAURA L, MD   Chief Complaint: Syncope   HPI: [facility] is a 80 y.o. female h/o ESRD and very labile BPs during dialysis (will go from very hypertensive to hypotensive very rapidly per discussion with Dr. Eliott Nineunham) resulting in syncope previously.  [facility] after a syncopal event that occurred during dialysis today.  Exact circumstances regarding syncopal event not known at this time (Dr. Eliott Nineunham will find out tomorrow).  [facility] she was hypertensive, slowly began to wake up.  Work up in ED showed fever of 100.5, no clear source, she was given emperic rocephin and azithromycin.  Work up also demonstrated metabolic alkalosis.  Review of Systems: Denies taking any baking soda PO or [place] at home, Systems reviewed.  As above, otherwise negative  Past Medical History  Diagnosis Date  . Hypertension   . Renal disorder   . TIA (transient ischemic attack)   . Arthritis   . Blood transfusion   . Aneurysm     brain  . DVT (deep venous thrombosis)    Past Surgical History  Procedure Laterality Date  . Cholecystectomy  08/02/2011    Procedure: LAPAROSCOPIC CHOLECYSTECTOMY WITH INTRAOPERATIVE CHOLANGIOGRAM;  Surgeon: Currie Parishristian J Streck, MD;  [facility];  Service: General;  Laterality: N/A;  . Abdominal hysterectomy     Social History:  reports that she has never smoked. She does not have any smokeless tobacco history on file. She reports that she does not drink alcohol or use illicit drugs.  No Known Allergies  History reviewed. No pertinent family history.   Prior to Admission medications   Medication Sig Start Date End Date Taking? Authorizing Provider  acetaminophen (TYLENOL) 325 MG tablet Take 650 mg by mouth every 6 (six) hours as needed.   Yes Historical Provider, MD  amLODipine (NORVASC) 10  MG tablet Take 10 mg by mouth 2 (two) times daily.   Yes Historical Provider, MD  benazepril (LOTENSIN) 40 MG tablet Take 40 mg by mouth 2 (two) times daily.   Yes Historical Provider, MD  calcium acetate (PHOSLO) 667 MG capsule Take 667 mg by mouth 3 (three) times daily with meals.   Yes Historical Provider, MD  cloNIDine (CATAPRES) 0.1 MG tablet Take 0.2 mg by mouth 2 (two) times daily.   Yes Historical Provider, MD  HYDROcodone-acetaminophen (VICODIN) 5-500 MG per tablet Take 0.5 tablets by mouth daily as needed. For pain   Yes Historical Provider, MD  multivitamin (RENA-VIT) TABS tablet Take 1 tablet by mouth at bedtime. 11/25/12  Yes Calvert CantorSaima Rizwan, MD   Physical Exam: Filed Vitals:   06/28/13 1832  BP: 202/71  Pulse: 76  Temp: 99.2 F (37.3 C)  Resp: 18    BP 202/71  Pulse 76  Temp(Src) 99.2 F (37.3 C) (Oral)  Resp 18  Ht 5\' 2"  (1.575 m)  Wt 59.6 kg (131 lb 6.3 oz)  BMI 24.03 kg/m2  SpO2 100%  General Appearance:    Sleepy somewhat difficult to arouse, oriented, no distress, appears stated age  Head:    Normocephalic, atraumatic  Eyes:    PERRL, EOMI, sclera non-icteric        Nose:   Nares without drainage or epistaxis. Mucosa, turbinates normal  Throat:   Moist mucous membranes. Oropharynx without erythema or exudate.  Neck:   Supple. No carotid bruits.  No thyromegaly.  No lymphadenopathy.   Back:     No CVA tenderness, no spinal tenderness  Lungs:     Clear to auscultation bilaterally, without wheezes, rhonchi or rales  Chest wall:    No tenderness to palpitation  Heart:    Regular rate and rhythm without murmurs, gallops, rubs  Abdomen:     Soft, non-tender, nondistended, normal bowel sounds, no organomegaly  Genitalia:    deferred  Rectal:    deferred  Extremities:   No clubbing, cyanosis or edema.  Pulses:   2+ and symmetric all extremities  Skin:   Skin color, texture, turgor normal, no rashes or lesions  Lymph nodes:   Cervical, supraclavicular, and axillary  nodes normal  Neurologic:   CNII-XII intact. Normal strength, sensation and reflexes      throughout    Labs on Admission:  Basic Metabolic Panel: No results found for this basename: NA, K, CL, CO2, GLUCOSE, BUN, CREATININE, CALCIUM, MG, PHOS,  in the last 168 hours Liver Function Tests: No results found for this basename: AST, ALT, ALKPHOS, BILITOT, PROT, ALBUMIN,  in the last 168 hours No results found for this basename: LIPASE, AMYLASE,  in the last 168 hours No results found for this basename: AMMONIA,  in the last 168 hours CBC: No results found for this basename: WBC, NEUTROABS, HGB, HCT, MCV, PLT,  in the last 168 hours Cardiac Enzymes: No results found for this basename: CKTOTAL, CKMB, CKMBINDEX, TROPONINI,  in the last 168 hours  BNP (last 3 results) No results found for this basename: PROBNP,  in the last 8760 hours CBG: No results found for this basename: GLUCAP,  in the last 168 hours  Radiological Exams on Admission: No results found.  EKG: Independently reviewed.  Assessment/Plan Principal Problem:   Syncope Active Problems:   ESRD (end stage renal disease) on dialysis   HTN (hypertension)-poorly controlled   Fever   Metabolic alkalosis   1. Syncope - DDx includes labile BPs resulting in hypotension during dialysis vs metabolic alkalosis 1. HTN - patient has very labile BPs during dialysis, currently she is very hypertensive: 202/71, putting patient on all home BP meds and starting hydralazine PRN for SBP > 180.  PRESS is another possibility as the records seem to indicate that her BP was running high during dialysis. 2. Metabolic alkalosis: ABG at [facility]: pH 7.550, pCO2 40, Bicarb: 35, bicarb was 33 on venous.  Patients venous bicarb was 26 last month.  Patient reports taking no PO baking soda for upset stomach or any other PO source of bicarb.  Unclear why the patients bicarb is this high.  Spoke with Dr. Eliott Nine who said it is possible but unlikely that the  dialysis machine the patient was on may not have been calibrated correctly thus causing this.  She will follow this up further tomorrow and try to figure out what exactly is causing this. 2. Fever - patient with elevated temperature, putting patient on tylenol, patient received empiric rocephin and azithromycin at outside facility, but no PNA on CXR.  No other SIRS criteria at this time, repeat CBC in AM, negative for rapid flu at ED.  Given that the unexplained metabolic alkalosis is certainly a likely cause of syncope and AMS, and no obvious source of infection, will hold off on ordering further ABx at this time.  Spoke with Dr. Eliott Nine over phone who suggested BP control for tonight, she will see patient tomorrow.  Code Status: Full  Family Communication: No family in  room Disposition Plan: Admit to inpatient   Time spent: 70 min  Barby Colvard M. [facility] Pager [phone]  If 7AM-7PM, please contact the day team taking care of the patient [email or web address] Password [facility] 06/28/2013, 7:54 PM

## 2013-06-28 NOTE — Progress Notes (Signed)
Paged Julian ReilGardner, MD that patient's BP is 202/71. Julian ReilGardner, MD at bedside to assess patient. Will continue to monitor patient. Nelda MarseilleJenny Thacker, RN

## 2013-06-29 ENCOUNTER — Inpatient Hospital Stay (HOSPITAL_COMMUNITY): Payer: Medicare Other

## 2013-06-29 DIAGNOSIS — E119 Type 2 diabetes mellitus without complications: Secondary | ICD-10-CM

## 2013-06-29 DIAGNOSIS — K5289 Other specified noninfective gastroenteritis and colitis: Secondary | ICD-10-CM

## 2013-06-29 DIAGNOSIS — R109 Unspecified abdominal pain: Secondary | ICD-10-CM

## 2013-06-29 LAB — CBC
HCT: 32.7 % — ABNORMAL LOW (ref 36.0–46.0)
HEMOGLOBIN: 10.5 g/dL — AB (ref 12.0–15.0)
MCH: 30.6 pg (ref 26.0–34.0)
MCHC: 32.1 g/dL (ref 30.0–36.0)
MCV: 95.3 fL (ref 78.0–100.0)
PLATELETS: 456 10*3/uL — AB (ref 150–400)
RBC: 3.43 MIL/uL — ABNORMAL LOW (ref 3.87–5.11)
RDW: 13.2 % (ref 11.5–15.5)
WBC: 7.5 10*3/uL (ref 4.0–10.5)

## 2013-06-29 LAB — BASIC METABOLIC PANEL
BUN: 18 mg/dL (ref 6–23)
CO2: 32 meq/L (ref 19–32)
Calcium: 9.7 mg/dL (ref 8.4–10.5)
Chloride: 96 mEq/L (ref 96–112)
Creatinine, Ser: 5.19 mg/dL — ABNORMAL HIGH (ref 0.50–1.10)
GFR calc Af Amer: 8 mL/min — ABNORMAL LOW (ref >90)
GFR, EST NON AFRICAN AMERICAN: 7 mL/min — AB (ref >90)
GLUCOSE: 101 mg/dL — AB (ref 70–99)
POTASSIUM: 4.7 meq/L (ref 3.7–5.3)
Sodium: 141 mEq/L (ref 137–147)

## 2013-06-29 NOTE — Progress Notes (Signed)
[facility] PROGRESS NOTE  [facility] ZOX:[id number] DOB: 18-Nov-1928 DOA: 06/28/2013 PCP: Ailene Ravel, MD  HPI/Subjective: Feels much better, denies any dizziness or other complaints.  Assessment/Plan: Principal Problem:   Syncope Active Problems:   ESRD (end stage renal disease) on dialysis   HTN (hypertension)-poorly controlled   Fever   Metabolic alkalosis   Syncope -In the patient with labile hypertension while she was getting dialysis. -Likely hypotension after being hypertensive closed the syncope. -Now was a concern about PRESS, likely this is not the cause as patient is doing is back to her baseline. -Await nephrology recommendation  Fever -Low-grade fever of 99.4, patient denies any cough, shortness of breath or other respiratory symptoms. -He still makes urine but denies any burning while passing urine, check urinalysis. -Normal WBC count without left shift, doubt this is infection. -Hold on antibiotics.  Metabolic alkalosis -Based on outside hospital venous blood gas. -BMP showing high normal bicarbonate. -Nephrology to evaluate  ESRD -Dialysis on Monday/Wednesday/Friday.  Code Status: Full code Family Communication: Plan discussed with the patient. Disposition Plan: Remains inpatient   Consultants:  Nephrology  Procedures:  None  Antibiotics:  None   Objective: Filed Vitals:   06/29/13 0644  BP: 159/71  Pulse:   Temp:   Resp:     Intake/Output Summary (Last 24 hours) at 06/29/13 1130 Last data filed at 06/29/13 0910  Gross per 24 hour  Intake    360 ml  Output      0 ml  Net    360 ml   Filed Weights   06/28/13 1832  Weight: 59.6 kg (131 lb 6.3 oz)    Exam: General: Alert and awake, oriented x3, not in any acute distress. HEENT: anicteric sclera, pupils reactive to light and accommodation, EOMI CVS: S1-S2 clear, no murmur rubs or gallops Chest: clear to auscultation bilaterally, no wheezing, rales or  rhonchi Abdomen: soft nontender, nondistended, normal bowel sounds, no organomegaly Extremities: no cyanosis, clubbing or edema noted bilaterally Neuro: Cranial nerves II-XII intact, no focal neurological deficits  Data Reviewed: Basic Metabolic Panel:  Recent Labs Lab 06/29/13 0356  NA 141  K 4.7  CL 96  CO2 32  GLUCOSE 101*  BUN 18  CREATININE 5.19*  CALCIUM 9.7   Liver Function Tests: No results found for this basename: AST, ALT, ALKPHOS, BILITOT, PROT, ALBUMIN,  in the last 168 hours No results found for this basename: LIPASE, AMYLASE,  in the last 168 hours No results found for this basename: AMMONIA,  in the last 168 hours CBC:  Recent Labs Lab 06/29/13 0356  WBC 7.5  HGB 10.5*  HCT 32.7*  MCV 95.3  PLT 456*   Cardiac Enzymes: No results found for this basename: CKTOTAL, CKMB, CKMBINDEX, TROPONINI,  in the last 168 hours BNP (last 3 results) No results found for this basename: PROBNP,  in the last 8760 hours CBG: No results found for this basename: GLUCAP,  in the last 168 hours  Micro No results found for this or any previous visit (from the past 240 hour(s)).   Studies: No results found.  Scheduled Meds: . amLODipine  10 mg Oral BID  . benazepril  40 mg Oral BID  . calcium acetate  667 mg Oral TID WC  . cloNIDine  0.2 mg Oral BID  . heparin  5,000 Units Subcutaneous Q8H  . multivitamin  1 tablet Oral QHS  . sodium chloride  3 mL Intravenous Q12H  . sodium chloride  3 mL  Intravenous Q12H   Continuous Infusions:      Time spent: 35 minutes    [facility],Hines Kloss A  [facility] Pager [phone] If 7PM-7AM, please contact night-coverage at [email or web address], password [email or web address] 06/29/2013, 11:30 AM  LOS: 1 day

## 2013-06-30 DIAGNOSIS — N390 Urinary tract infection, site not specified: Secondary | ICD-10-CM

## 2013-06-30 LAB — URINALYSIS, ROUTINE W REFLEX MICROSCOPIC
Bilirubin Urine: NEGATIVE
GLUCOSE, UA: NEGATIVE mg/dL
Ketones, ur: NEGATIVE mg/dL
Nitrite: NEGATIVE
SPECIFIC GRAVITY, URINE: 1.011 (ref 1.005–1.030)
UROBILINOGEN UA: 0.2 mg/dL (ref 0.0–1.0)
pH: 8.5 — ABNORMAL HIGH (ref 5.0–8.0)

## 2013-06-30 LAB — INFLUENZA PANEL BY PCR (TYPE A & B)
H1N1 flu by pcr: NOT DETECTED
INFLAPCR: NEGATIVE
INFLBPCR: NEGATIVE

## 2013-06-30 LAB — URINE MICROSCOPIC-ADD ON

## 2013-06-30 MED ORDER — DEXTROSE 5 % IV SOLN
1.0000 g | INTRAVENOUS | Status: DC
  Administered 2013-06-30: 1 g via INTRAVENOUS
  Filled 2013-06-30 (×3): qty 10

## 2013-06-30 MED ORDER — ONDANSETRON HCL 4 MG/2ML IJ SOLN
4.0000 mg | Freq: Four times a day (QID) | INTRAMUSCULAR | Status: DC | PRN
  Administered 2013-06-30: 18:00:00 4 mg via INTRAVENOUS

## 2013-06-30 MED ORDER — SODIUM CHLORIDE 0.9 % IV SOLN
62.5000 mg | INTRAVENOUS | Status: DC
Start: 2013-07-03 — End: 2013-07-02

## 2013-06-30 MED ORDER — LABETALOL HCL 200 MG PO TABS
200.0000 mg | ORAL_TABLET | Freq: Two times a day (BID) | ORAL | Status: DC
  Administered 2013-06-30 – 2013-07-02 (×5): 200 mg via ORAL
  Filled 2013-06-30 (×6): qty 1

## 2013-06-30 MED ORDER — FUROSEMIDE 10 MG/ML IJ SOLN: 80.0000 mg | Freq: Once | INTRAMUSCULAR | Status: DC

## 2013-06-30 MED ORDER — ONDANSETRON HCL 4 MG/2ML IJ SOLN
INTRAMUSCULAR | Status: AC
  Filled 2013-06-30: qty 2

## 2013-06-30 MED ORDER — DOXERCALCIFEROL 4 MCG/2ML IV SOLN
1.0000 ug | INTRAVENOUS | Status: DC
  Administered 2013-07-01: 1 ug via INTRAVENOUS
  Filled 2013-06-30: qty 2

## 2013-06-30 MED ORDER — DARBEPOETIN ALFA-POLYSORBATE 40 MCG/0.4ML IJ SOLN
40.0000 ug | INTRAMUSCULAR | Status: DC
  Administered 2013-07-01: 40 ug via INTRAVENOUS

## 2013-06-30 NOTE — Progress Notes (Signed)
[facility] PROGRESS NOTE  [facility] ZOX:[id number] DOB: 03-07-1929 DOA: 06/28/2013 PCP: Judy Ravel, MD  HPI/Subjective: Had fever of 100.4, clear chest x-ray. Get urine culture and check for the flu.  Assessment/Plan: Principal Problem:   Syncope Active Problems:   ESRD (end stage renal disease) on dialysis   HTN (hypertension)-poorly controlled   Fever   Metabolic alkalosis   Syncope -In the patient with labile hypertension while she was getting dialysis. -Likely hypotension after being hypertensive closed the syncope. -Now was a concern about PRESS, likely this is not the cause as patient is doing is back to her baseline. -Await nephrology recommendation  UTI -Patient urinalysis consistent with UTI. -I will start patient on Rocephin, adjust antibiotics according to culture results.  Fever -Low-grade fever of 99.4, and again last night she had spiked temp of 100.4. -He still makes urine but denies any burning while passing urine, check urinalysis. -Started on antibiotics for pyuria.  Hypertension -Known to have labile blood pressure, usually high and gets very low during dialysis. -I will check an MRI of the brain to rule out primary neurological events.  Metabolic alkalosis -Based on outside hospital venous blood gas. -BMP showing high normal bicarbonate. -Nephrology to evaluate  ESRD -Dialysis on Monday/Wednesday/Friday.  Code Status: Full code Family Communication: Plan discussed with the patient. Disposition Plan: Remains inpatient   Consultants:  Nephrology  Procedures:  None  Antibiotics:  None   Objective: Filed Vitals:   06/30/13 0648  BP: 184/61  Pulse: 85  Temp:   Resp:    No intake or output data in the 24 hours ending 06/30/13 1148 Filed Weights   06/28/13 1832  Weight: 59.6 kg (131 lb 6.3 oz)    Exam: General: Alert and awake, oriented x3, not in any acute distress. HEENT: anicteric sclera, pupils reactive to  light and accommodation, EOMI CVS: S1-S2 clear, no murmur rubs or gallops Chest: clear to auscultation bilaterally, no wheezing, rales or rhonchi Abdomen: soft nontender, nondistended, normal bowel sounds, no organomegaly Extremities: no cyanosis, clubbing or edema noted bilaterally Neuro: Cranial nerves II-XII intact, no focal neurological deficits  Data Reviewed: Basic Metabolic Panel:  Recent Labs Lab 06/29/13 0356  NA 141  K 4.7  CL 96  CO2 32  GLUCOSE 101*  BUN 18  CREATININE 5.19*  CALCIUM 9.7   Liver Function Tests: No results found for this basename: AST, ALT, ALKPHOS, BILITOT, PROT, ALBUMIN,  in the last 168 hours No results found for this basename: LIPASE, AMYLASE,  in the last 168 hours No results found for this basename: AMMONIA,  in the last 168 hours CBC:  Recent Labs Lab 06/29/13 0356  WBC 7.5  HGB 10.5*  HCT 32.7*  MCV 95.3  PLT 456*   Cardiac Enzymes: No results found for this basename: CKTOTAL, CKMB, CKMBINDEX, TROPONINI,  in the last 168 hours BNP (last 3 results) No results found for this basename: PROBNP,  in the last 8760 hours CBG: No results found for this basename: GLUCAP,  in the last 168 hours  Micro No results found for this or any previous visit (from the past 240 hour(s)).   Studies: Dg Chest 2 View  06/29/2013   CLINICAL DATA:  Fever  EXAM: CHEST  2 VIEW  COMPARISON:  06/28/2013  FINDINGS: Cardiac shadow is mildly enlarged. Mild central vascular congestion is seen with mild interstitial edema. No focal infiltrate or sizable effusion is noted.  IMPRESSION: No significant change from the prior exam.  Electronically Signed   By: Alcide CleverMark  Lukens M.D.   On: 06/29/2013 16:57    Scheduled Meds: . amLODipine  10 mg Oral BID  . benazepril  40 mg Oral BID  . calcium acetate  667 mg Oral TID WC  . cloNIDine  0.2 mg Oral BID  . heparin  5,000 Units Subcutaneous Q8H  . multivitamin  1 tablet Oral QHS  . sodium chloride  3 mL Intravenous Q12H   . sodium chloride  3 mL Intravenous Q12H   Continuous Infusions:      Time spent: 35 minutes    [facility]MA[facility],[name]  [facility] Pager [phone] If 7PM-7AM, please contact night-coverage at [email or web address], password [email or web address] 06/30/2013, 11:48 AM  LOS: 2 days
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
## 2013-07-01 LAB — URINE CULTURE

## 2013-07-01 LAB — BASIC METABOLIC PANEL
BUN: 42 mg/dL — AB (ref 6–23)
CALCIUM: 9.3 mg/dL (ref 8.4–10.5)
CHLORIDE: 91 meq/L — AB (ref 96–112)
CO2: 30 mEq/L (ref 19–32)
CREATININE: 8.59 mg/dL — AB (ref 0.50–1.10)
GFR calc Af Amer: 4 mL/min — ABNORMAL LOW (ref >90)
GFR, EST NON AFRICAN AMERICAN: 4 mL/min — AB (ref >90)
Glucose, Bld: 116 mg/dL — ABNORMAL HIGH (ref 70–99)
Potassium: 4.7 mEq/L (ref 3.7–5.3)
Sodium: 135 mEq/L — ABNORMAL LOW (ref 137–147)

## 2013-07-01 LAB — CBC
HCT: 27.9 % — ABNORMAL LOW (ref 36.0–46.0)
Hemoglobin: 9.3 g/dL — ABNORMAL LOW (ref 12.0–15.0)
MCH: 31.4 pg (ref 26.0–34.0)
MCHC: 33.3 g/dL (ref 30.0–36.0)
MCV: 94.3 fL (ref 78.0–100.0)
PLATELETS: 392 10*3/uL (ref 150–400)
RBC: 2.96 MIL/uL — ABNORMAL LOW (ref 3.87–5.11)
RDW: 13.2 % (ref 11.5–15.5)
WBC: 6.2 10*3/uL (ref 4.0–10.5)

## 2013-07-01 LAB — MRSA PCR SCREENING: MRSA BY PCR: NEGATIVE

## 2013-07-01 MED ORDER — DOXERCALCIFEROL 4 MCG/2ML IV SOLN
INTRAVENOUS | Status: AC
  Filled 2013-07-01: qty 2

## 2013-07-01 MED ORDER — HYDRALAZINE HCL 25 MG PO TABS
25.0000 mg | ORAL_TABLET | Freq: Three times a day (TID) | ORAL | Status: DC
  Administered 2013-07-01 – 2013-07-02 (×3): 25 mg via ORAL
  Filled 2013-07-01 (×5): qty 1

## 2013-07-01 MED ORDER — ACETAMINOPHEN 325 MG PO TABS
ORAL_TABLET | ORAL | Status: AC
  Administered 2013-07-01: 650 mg via ORAL
  Filled 2013-07-01: qty 2

## 2013-07-01 MED ORDER — DARBEPOETIN ALFA-POLYSORBATE 40 MCG/0.4ML IJ SOLN
INTRAMUSCULAR | Status: AC
  Filled 2013-07-01: qty 0.4

## 2013-07-01 NOTE — Procedures (Signed)
I was present at this dialysis session, have reviewed the session itself and made  appropriate changes  Vinson Moselleob Lennette Fader MD (pgr) [phone]    ([phone]) [phone] 07/01/2013, 12:08 PM

## 2013-07-01 NOTE — Progress Notes (Signed)
[facility] PROGRESS NOTE  Judy Wolf ZOX:[id number] DOB: 05-14-1929 DOA: 06/28/2013 PCP: Ailene Ravel, MD  HPI/Subjective: Patient feeling better but nervous about going to HD.  States her body really doesn't respond well to HD.  Assessment/Plan: Principal Problem:   Syncope Active Problems:   ESRD (end stage renal disease) on dialysis   HTN (hypertension)-poorly controlled   UTI (lower urinary tract infection)   Fever   Metabolic alkalosis   Syncope -Likely hypotension after being hypertensive caused the syncope. -patient with labile blood pressure while she was getting dialysis. -There was a concern about PRESS, likely this is not the case as patient is back to her baseline. -Nephrology has been consulted and they have ordered an Echo. -Perhaps decreasing her BP meds on HD days would help.  UTI -Patient urinalysis consistent with UTI. Culture shows 75k colonies of multiple morphotypes. -started on Rocephin - will continue despite non specific culture result.  Fever -Low-grade fever of 99.4 on 06/30/13. -uncertain etiology.   -Influenza PCR negative. Patient with no diarrhea or cough.  CXR 1/10 neg for infiltrate. -Started on antibiotics for pyuria. -If she spikes a fever again will obtain blood cultures.   Metabolic alkalosis -Based on outside hospital venous blood gas. -BMP showing high normal bicarbonate. -Nephrology to evaluate - likely related to hypotension.  ESRD -Dialysis on Monday/Wednesday/Friday.  Code Status: Full code Family Communication: Plan discussed with the patient. Disposition Plan: Remains inpatient.  Anticipate d/c to SNF on 1/13.   Consultants:  Nephrology  Procedures:  None  Antibiotics:  None   Objective: Filed Vitals:   07/01/13 1607  BP:   Pulse: 84  Temp:   Resp: 18    Intake/Output Summary (Last 24 hours) at 07/01/13 1609 Last data filed at 07/01/13 1607  Gross per 24 hour  Intake    363 ml  Output    1300 ml  Net   -937 ml   Filed Weights   06/28/13 1832 07/01/13 0129 07/01/13 1154  Weight: 59.6 kg (131 lb 6.3 oz) 55.7 kg (122 lb 12.7 oz) 55.4 kg (122 lb 2.2 oz)    Exam: General: Alert and awake, oriented x3, not in any acute distress. HEENT: anicteric sclera, pupils reactive to light and accommodation, EOMI CVS: S1-S2 clear, no murmur rubs or gallops Chest: clear to auscultation bilaterally, no wheezing, rales or rhonchi Abdomen: soft nontender, nondistended, normal bowel sounds, no organomegaly Extremities: no cyanosis, clubbing or edema noted bilaterally Neuro: Cranial nerves II-XII intact, no focal neurological deficits  Data Reviewed: Basic Metabolic Panel:  Recent Labs Lab 06/29/13 0356 07/01/13 0308  NA 141 135*  K 4.7 4.7  CL 96 91*  CO2 32 30  GLUCOSE 101* 116*  BUN 18 42*  CREATININE 5.19* 8.59*  CALCIUM 9.7 9.3   CBC:  Recent Labs Lab 06/29/13 0356 07/01/13 0308  WBC 7.5 6.2  HGB 10.5* 9.3*  HCT 32.7* 27.9*  MCV 95.3 94.3  PLT 456* 392    Micro Recent Results (from the past 240 hour(s))  URINE CULTURE     Status: None   Collection Time    06/30/13  5:51 AM      Result Value Range Status   Specimen Description URINE, RANDOM   Final   Special Requests NONE   Final   Culture  Setup Time     Final   Value: 06/30/2013 18:01     Performed at [facility] Count     Final  Value: 75,000 COLONIES/ML     Performed at [facility]   Culture     Final   Value: Multiple bacterial morphotypes present, none predominant. Suggest appropriate recollection if clinically indicated.     Performed at [facility]   Report Status 07/01/2013 FINAL   Final  MRSA PCR SCREENING     Status: None   Collection Time    07/01/13  9:55 AM      Result Value Range Status   MRSA by PCR NEGATIVE  NEGATIVE Final   Comment:            The GeneXpert MRSA Assay (FDA     approved for NASAL specimens     only), is one component of a      comprehensive MRSA colonization     surveillance program. It is not     intended to diagnose MRSA     infection nor to guide or     monitor treatment for     MRSA infections.     Studies: Dg Chest 2 View  06/29/2013   CLINICAL DATA:  Fever  EXAM: CHEST  2 VIEW  COMPARISON:  06/28/2013  FINDINGS: Cardiac shadow is mildly enlarged. Mild central vascular congestion is seen with mild interstitial edema. No focal infiltrate or sizable effusion is noted.  IMPRESSION: No significant change from the prior exam.   Electronically Signed   By: Alcide CleverMark  Lukens M.D.   On: 06/29/2013 16:57    Scheduled Meds: . amLODipine  10 mg Oral BID  . benazepril  40 mg Oral BID  . calcium acetate  667 mg Oral TID WC  . cefTRIAXone (ROCEPHIN)  IV  1 g Intravenous Q24H  . cloNIDine  0.2 mg Oral BID  . [START ON 07/03/2013] darbepoetin (ARANESP) injection - DIALYSIS  40 mcg Intravenous Q Wed-HD  . doxercalciferol  1 mcg Intravenous Q M,W,F-HD  . [START ON 07/03/2013] ferric gluconate (FERRLECIT/NULECIT) IV  62.5 mg Intravenous Q Wed-HD  . heparin  5,000 Units Subcutaneous Q8H  . labetalol  200 mg Oral BID  . multivitamin  1 tablet Oral QHS  . sodium chloride  3 mL Intravenous Q12H  . sodium chloride  3 mL Intravenous Q12H   Continuous Infusions:     Conley CanalYork, Amel Kitch L, PA-C  [facility] Pager [phone] If 7PM-7AM, please contact night-coverage at [email or web address], password [email or web address] 07/01/2013, 4:09 PM  LOS: 3 days

## 2013-07-01 NOTE — Progress Notes (Signed)
Pt's IV access infiltrated during IV administration of rocephin. Pt received about 10cc of Rocephin. Attempted to start new IV access on pt and was unsuccessful on first try. Pt refused further sticks stating "I do not want to be stuck any more" and stated "i don't want these antibiotics". Provided education to patient about the necessity and purpose of antibiotics and the need for IV access, but patient refused. Pt to be d/c tomorrow. MD notified and aware and okay with refusal.

## 2013-07-01 NOTE — Progress Notes (Signed)
  [facility] Progress Note   Subjective: No complaints  Filed Vitals:   06/30/13 2234 07/01/13 0129 07/01/13 0155 07/01/13 0511  BP: 153/60  147/56 157/50  Pulse: 72   73  Temp: 99 F (37.2 C)   98.4 F (36.9 C)  TempSrc: Oral   Oral  Resp: 20   16  Height:      Weight:  55.7 kg (122 lb 12.7 oz)    SpO2: 99%   98%  Exam Alert, no distress, calm Neck: supple, no jvd  Heart: RRR, no mur or rub  Lungs: CTA bilat Abd: BS pos, soft NT, ND  Ext: No pedal edema  Neuro: Alert OX3 , no acute deficits noted  Access: L forearm AVF patent  Dialysis: [facility] MWF  4h   55kg   2K/2.25Ca Bath   Heparin 1500   LFA AVF   BFR 400 Hectorol 1     Epogen 1200    Venofer 50mg   Other op labs hgb 10.3, 10.5/ tfs= 28%  Assessment/Plan  1. Syncope: post HD on Friday, prob due to fluid/BP shifts. Have ordered echo (last one 2011) 2. ESRD: HD today 3. Low grade fever / abnl UA: UA looks contaminated, would advise repeat with cath specimen and/or minimize AB course 4. Hypertension/volume - 3 bp meds, BP high/normal, reweigh standing pre HD today 5. Anemia - hgb 10.5 continue ESA and weekly iron 6. Metabolic bone disease - Phoslo binder/ hectorol on hd 7. HO TIA 8. Debility: unsteady on feet per daughter, PT to assess   Judy Wolf pager [phone]    cell [phone] 07/01/2013, 10:12 AM   Recent Labs Lab 06/29/13 0356 07/01/13 0308  NA 141 135*  K 4.7 4.7  CL 96 91*  CO2 32 30  GLUCOSE 101* 116*  BUN 18 42*  CREATININE 5.19* 8.59*  CALCIUM 9.7 9.3   No results found for this basename: AST, ALT, ALKPHOS, BILITOT, PROT, ALBUMIN,  in the last 168 hours  Recent Labs Lab 06/29/13 0356 07/01/13 0308  WBC 7.5 6.2  HGB 10.5* 9.3*  HCT 32.7* 27.9*  MCV 95.3 94.3  PLT 456* 392   . amLODipine  10 mg Oral BID  . benazepril  40 mg Oral BID  . calcium acetate  667 mg Oral TID WC  . cefTRIAXone (ROCEPHIN)  IV  1 g Intravenous Q24H  . cloNIDine  0.2 mg Oral BID  . [START  ON 07/03/2013] darbepoetin (ARANESP) injection - DIALYSIS  40 mcg Intravenous Q Wed-HD  . doxercalciferol  1 mcg Intravenous Q M,W,F-HD  . [START ON 07/03/2013] ferric gluconate (FERRLECIT/NULECIT) IV  62.5 mg Intravenous Q Wed-HD  . heparin  5,000 Units Subcutaneous Q8H  . labetalol  200 mg Oral BID  . multivitamin  1 tablet Oral QHS  . sodium chloride  3 mL Intravenous Q12H  . sodium chloride  3 mL Intravenous Q12H     sodium chloride, acetaminophen, hydrALAZINE, ondansetron (ZOFRAN) IV, sodium chloride

## 2013-07-01 NOTE — Progress Notes (Signed)
Addendum  Patient seen and examined, chart and data base reviewed.  I agree with the above assessment and plan.  For full details please see Mrs. Algis DownsMarianne York PA note.   Clint LippsMutaz A Adair Lauderback, MD [facility] Pager: [phone] 07/01/2013, 5:34 PM

## 2013-07-02 DIAGNOSIS — I369 Nonrheumatic tricuspid valve disorder, unspecified: Secondary | ICD-10-CM

## 2013-07-02 DIAGNOSIS — Z86711 Personal history of pulmonary embolism: Secondary | ICD-10-CM

## 2013-07-02 MED ORDER — LABETALOL HCL 200 MG PO TABS: 200.0000 mg | ORAL_TABLET | Freq: Two times a day (BID) | ORAL | Status: DC

## 2013-07-02 MED ORDER — HYDROCODONE-ACETAMINOPHEN 5-500 MG PO TABS: 0.5000 | ORAL_TABLET | Freq: Every day | ORAL | Status: DC | PRN

## 2013-07-02 NOTE — Discharge Summary (Signed)
Physician Discharge Summary  Judy FullerHarris M Wolf ZOX:[id number] DOB: 12/27/1928 DOA: 06/28/2013  PCP: Judy RavelHAMRICK,MAURA L, MD  Admit date: 06/28/2013 Discharge date: 07/02/2013  Time spent: 50 minutes  Recommendations for Outpatient Follow-up:  1. Follow strict Renal Diet.  Excess sodium has caused BP shifts. 2. Patient has been started on Labetalol in addition to her previous BP medications.   Has Labile BP.  Admitted after syncope in HD.   Discharge Diagnoses:  Principal Problem:   Syncope Active Problems:   ESRD (end stage renal disease) on dialysis   HTN (hypertension)-poorly controlled   UTI (lower urinary tract infection)   Fever   Metabolic alkalosis   Discharge Condition: stable.  Diet recommendation: stable.  Filed Weights   07/01/13 0129 07/01/13 1154 07/01/13 1607  Weight: 55.7 kg (122 lb 12.7 oz) 55.4 kg (122 lb 2.2 oz) 54 kg (119 lb 0.8 oz)    History of present illness at the time of admission:  Judy Wolf is an 80 y.o. female h/o ESRD and very labile BPs during dialysis (will go from very hypertensive to hypotensive very rapidly per discussion with Dr. Eliott Nineunham) resulting in syncope previously. [facility] a syncopal event that occurred during dialysis.  [facility] hypertensive, slowly began to wake up.  Work up in ED showed fever of 100.5, no clear source, she was given emperic rocephin and azithromycin. Work up also demonstrated metabolic alkalosis.  Hospital Course:   Syncope  -Likely wide swing in BP (hypotension after being hypertensive) caused the syncope.  -Nephrology feels sodium intake may have contributed to BP swings in HD. -There was a concern about PRESS, likely this is not the case as patient is back to her baseline.  -Echo result showed grade 1 diastolic dysfunction. -Perhaps decreasing her BP meds on HD mornings would help.   UTI  -Patient urinalysis consistent with UTI. Culture shows 75k colonies of  multiple morphotypes.  -started on Rocephin completed 4 days of antibiotic therapy.  Fever  -Low-grade fever of 99.4 on 06/30/13. No fever since. -uncertain etiology.  -Influenza PCR negative. Patient with no diarrhea or cough. CXR 1/10 neg for infiltrate.  -Started on antibiotics for pyuria. These were completed inpatient.  Metabolic alkalosis  -Based on outside hospital venous blood gas.  -BMP showing high normal bicarbonate.  -likely related to hypotension.   ESRD  -Dialysis on Monday/Wednesday/Friday.      Procedures:  2 D echo listed below.  Consultations:  Nephrology  Discharge Exam: Filed Vitals:   07/02/13 1055  BP: 176/62  Pulse: 78  Temp:   Resp:    General: Alert and awake, oriented x3, not in any acute distress. Very pleasant. HEENT: anicteric sclera, pupils reactive to light and accommodation, EOMI  CVS: S1-S2 clear, no murmur rubs or gallops  Chest: clear to auscultation bilaterally, no wheezing, rales or rhonchi  Abdomen: soft nontender, nondistended, normal bowel sounds, no organomegaly  Extremities: no cyanosis, clubbing or edema noted bilaterally  Neuro: Cranial nerves II-XII intact, no focal neurological deficits    Discharge Instructions      Discharge Orders   Future Orders Complete By Expires   Diet general  As directed    Comments:     RENAL DIET   Increase activity slowly  As directed        Medication List         acetaminophen 325 MG tablet  Commonly known as:  TYLENOL  Take 650  mg by mouth every 6 (six) hours as needed.     amLODipine 10 MG tablet  Commonly known as:  NORVASC  Take 10 mg by mouth 2 (two) times daily.     benazepril 40 MG tablet  Commonly known as:  LOTENSIN  Take 40 mg by mouth 2 (two) times daily.     calcium acetate 667 MG capsule  Commonly known as:  PHOSLO  Take 667 mg by mouth 3 (three) times daily with meals.     cloNIDine 0.1 MG tablet  Commonly known as:  CATAPRES  Take 0.2 mg by mouth 2  (two) times daily.     HYDROcodone-acetaminophen 5-500 MG per tablet  Commonly known as:  VICODIN  Take 0.5 tablets by mouth daily as needed. For pain     labetalol 200 MG tablet  Commonly known as:  NORMODYNE  Take 1 tablet (200 mg total) by mouth 2 (two) times daily.     multivitamin Tabs tablet  Take 1 tablet by mouth at bedtime.       No Known Allergies    The results of significant diagnostics from this hospitalization (including imaging, microbiology, ancillary and laboratory) are listed below for reference.    Significant Diagnostic Studies:  2D echo: Study Conclusions  - Left ventricle: The cavity size was normal. Wall thickness was increased in a pattern of moderate LVH. Systolic function was normal. The estimated ejection fraction was in the range of 55% to 60%. Wall motion was normal; there were no regional wall motion abnormalities. Doppler parameters are consistent with abnormal left ventricular relaxation (grade 1 diastolic dysfunction). Doppler parameters are consistent with high ventricular filling pressure. - Aortic valve: Trivial regurgitation. - Mitral valve: Calcified annulus. Mildly thickened leaflets - Left atrium: The atrium was mildly dilated. - Pulmonary arteries: PA peak pressure: 31mm Hg (S).   Dg Chest 2 View  06/29/2013   CLINICAL DATA:  Fever  EXAM: CHEST  2 VIEW  COMPARISON:  06/28/2013  FINDINGS: Cardiac shadow is mildly enlarged. Mild central vascular congestion is seen with mild interstitial edema. No focal infiltrate or sizable effusion is noted.  IMPRESSION: No significant change from the prior exam.   Electronically Signed   By: Alcide Clever M.D.   On: 06/29/2013 16:57       Microbiology: Recent Results (from the past 240 hour(s))  URINE CULTURE     Status: None   Collection Time    06/30/13  5:51 AM      Result Value Range Status   Specimen Description URINE, RANDOM   Final   Special Requests NONE   Final   Culture  Setup Time      Final   Value: 06/30/2013 18:01     Performed at [facility] Count     Final   Value: 75,000 COLONIES/ML     Performed at [facility]   Culture     Final   Value: Multiple bacterial morphotypes present, none predominant. Suggest appropriate recollection if clinically indicated.     Performed at [facility]   Report Status 07/01/2013 FINAL   Final  MRSA PCR SCREENING     Status: None   Collection Time    07/01/13  9:55 AM      Result Value Range Status   MRSA by PCR NEGATIVE  NEGATIVE Final   Comment:            The GeneXpert MRSA Assay (FDA  approved for NASAL specimens     only), is one component of a     comprehensive MRSA colonization     surveillance program. It is not     intended to diagnose MRSA     infection nor to guide or     monitor treatment for     MRSA infections.     Labs: Basic Metabolic Panel:  Recent Labs Lab 06/29/13 0356 07/01/13 0308  NA 141 135*  K 4.7 4.7  CL 96 91*  CO2 32 30  GLUCOSE 101* 116*  BUN 18 42*  CREATININE 5.19* 8.59*  CALCIUM 9.7 9.3   CBC:  Recent Labs Lab 06/29/13 0356 07/01/13 0308  WBC 7.5 6.2  HGB 10.5* 9.3*  HCT 32.7* 27.9*  MCV 95.3 94.3  PLT 456* 392      Signed:  Conley Canal [phone]  [facility] 07/02/2013, 2:20 PM

## 2013-07-02 NOTE — Discharge Summary (Signed)
Addendum  Patient seen and examined, chart and data base reviewed.  I agree with the above assessment and plan.  For full details please see Mrs. Algis DownsMarianne York PA note.   Clint LippsMutaz A Tunis Gentle, MD [facility] Pager: [phone] 07/02/2013, 6:12 PM

## 2013-07-02 NOTE — Progress Notes (Signed)
  [facility] Progress Note   Subjective: No complaints, still weak, [facility]  Filed Vitals:   07/01/13 1758 07/01/13 2130 07/01/13 2216 07/02/13 0510  BP: 160/60 156/66  170/54  Pulse:  86  78  Temp:   99.7 F (37.6 C) 98.4 F (36.9 C)  TempSrc:   Oral Oral  Resp:    20  Height:      Weight:      SpO2:   96% 99%  Exam Alert, no distress, calm Neck: supple, no jvd  Heart: RRR, no mur or rub  Lungs: CTA bilat Abd: BS pos, soft NT, ND  Ext: No pedal edema  Neuro: Alert OX3 , no acute deficits noted  Access: L forearm AVF patent  Dialysis: [facility] MWF  4h   55kg   2K/2.25Ca Bath   Heparin 1500   LFA AVF   BFR 400 Hectorol 1     Epogen 1200    Venofer 50mg   Other op labs hgb 10.3, 10.5/ tfs= 28%  Assessment/Plan  1. Syncope: post HD on Friday, prob due to fluid/BP shifts in OP setting. Not a problem here since Na and fluid intake are strictly limited.  2. ESRD: HD today 3. Low grade fever / abnl UA: UA looks contaminated, would advise repeat with cath specimen and/or minimize AB course 4. Hypertension/volume - on 4 bp meds, BP runs high. Would not change BP meds at this time, keep on same as on admission. 5. Anemia - hgb 10.5 continue ESA and weekly iron 6. Metabolic bone disease - Phoslo binder/ hectorol on hd 7. HO TIA 8. Debility: for ST SNFP, possibly today per primary   Vinson Moselleob Cristela Stalder MD pager [phone]    cell [phone] 07/02/2013, 8:37 AM   Recent Labs Lab 06/29/13 0356 07/01/13 0308  NA 141 135*  K 4.7 4.7  CL 96 91*  CO2 32 30  GLUCOSE 101* 116*  BUN 18 42*  CREATININE 5.19* 8.59*  CALCIUM 9.7 9.3   No results found for this basename: AST, ALT, ALKPHOS, BILITOT, PROT, ALBUMIN,  in the last 168 hours  Recent Labs Lab 06/29/13 0356 07/01/13 0308  WBC 7.5 6.2  HGB 10.5* 9.3*  HCT 32.7* 27.9*  MCV 95.3 94.3  PLT 456* 392   . amLODipine  10 mg Oral BID  . benazepril  40 mg Oral BID  . calcium acetate  667 mg Oral TID WC  .  cefTRIAXone (ROCEPHIN)  IV  1 g Intravenous Q24H  . cloNIDine  0.2 mg Oral BID  . [START ON 07/03/2013] darbepoetin (ARANESP) injection - DIALYSIS  40 mcg Intravenous Q Wed-HD  . doxercalciferol  1 mcg Intravenous Q M,W,F-HD  . [START ON 07/03/2013] ferric gluconate (FERRLECIT/NULECIT) IV  62.5 mg Intravenous Q Wed-HD  . heparin  5,000 Units Subcutaneous Q8H  . hydrALAZINE  25 mg Oral Q8H  . labetalol  200 mg Oral BID  . multivitamin  1 tablet Oral QHS  . sodium chloride  3 mL Intravenous Q12H  . sodium chloride  3 mL Intravenous Q12H     sodium chloride, acetaminophen, hydrALAZINE, ondansetron (ZOFRAN) IV, sodium chloride

## 2013-07-02 NOTE — Clinical Social Work Placement (Signed)
Clinical Social Work Department CLINICAL SOCIAL WORK PLACEMENT NOTE 07/02/2013  Patient:  [name],Judy Wolf  Account Number:  [id number] Admit date:  06/28/2013  Clinical Social Worker:  Cherre BlancJOSEPH BRYANT Judy Wolf, [place]  Date/time:  07/02/2013 11:15 AM  Clinical Social Work is seeking post-discharge placement for this patient at the following level of care:   SKILLED NURSING   (*CSW will update this form in Epic as items are completed)   07/02/2013  Patient/family provided with [facility] Department of Clinical Social Work's list of facilities offering this level of care within the geographic area requested by the patient (or if unable, by the patient's family).  07/02/2013  Patient/family informed of their freedom to choose among providers that offer the needed level of care, that participate in Medicare, Medicaid or managed care program needed by the patient, have an available bed and are willing to accept the patient.  07/02/2013  Patient/family informed of [facility]' ownership interest in [facility], as well as of the fact that they are under no obligation to receive care at this facility.  PASARR submitted to EDS on 07/01/2013 PASARR number received from EDS on 07/01/2013  FL2 transmitted to all facilities in geographic area requested by pt/family on  07/02/2013 FL2 transmitted to all facilities within larger geographic area on   Patient informed that his/her managed care company has contracts with or will negotiate with  certain facilities, including the following:     Patient/family informed of bed offers received:  07/02/2013 [facility] Physician recommends and patient chooses bed at    [facility]  07/02/2013 Patient to be transferred to facility by ambulance  The following physician request were entered in Epic:   Additional Comments: Per MD [facility]. RN, patient, daughter, and facility notified of DC. RN given number for report. Ambulance transport requested for patient for around 5PM. DC packet left on chart. CSW signing off at this time.   Roddie McBryant Alitzel Cookson, [place], [place], [phone]

## 2013-07-02 NOTE — Care Management Note (Signed)
    Page 1 of 1   07/02/2013     5:46:10 PM   CARE MANAGEMENT NOTE 07/02/2013  Patient:  Judy Wolf,Judy Wolf   Account Number:  [id number]  Date Initiated:  07/02/2013  Documentation initiated by:  Letha CapeAYLOR,Shontavia Mickel  Subjective/Objective Assessment:   dx syncope  admit- from home     Action/Plan:   pt eval- snf   Anticipated DC Date:  06/25/2013   Anticipated DC Plan:  SKILLED NURSING FACILITY  In-house referral  Clinical Social Worker      DC Planning Services  CM consult      Choice offered to / List presented to:             Status of service:  Completed, signed off Medicare Important Message given?   (If response is "NO", the following Medicare IM given date fields will be blank) Date Medicare IM given:   Date Additional Medicare IM given:    Discharge Disposition:  SKILLED NURSING FACILITY  Per UR Regulation:  Reviewed for med. necessity/level of care/duration of stay  If discussed at Long Length of Stay Meetings, dates discussed:    Comments:  07/02/13 17:45 Letha Capeeborah Mauricio Dahlen RN, BSN [phone] [facility], CSW following.

## 2013-07-02 NOTE — Clinical Social Work Placement (Signed)
Clinical Social Work Department CLINICAL SOCIAL WORK PLACEMENT NOTE 07/02/2013  Patient:  Judy Wolf,[name]  Account Number:  [id number] Admit date:  06/28/2013  Clinical Social Worker:  Cherre BlancJOSEPH BRYANT Luetta Piazza, [place]  Date/time:  07/02/2013 11:15 AM  Clinical Social Work is seeking post-discharge placement for this patient at the following level of care:   SKILLED NURSING   (*CSW will update this form in Epic as items are completed)   07/02/2013  Patient/family provided with [facility] Department of Clinical Social Work's list of facilities offering this level of care within the geographic area requested by the patient (or if unable, by the patient's family).  07/02/2013  Patient/family informed of their freedom to choose among providers that offer the needed level of care, that participate in Medicare, Medicaid or managed care program needed by the patient, have an available bed and are willing to accept the patient.  07/02/2013  Patient/family informed of [facility] ownership interest in [facility], as well as of the fact that they are under no obligation to receive care at this facility.  PASARR submitted to EDS on 07/01/2013 PASARR number received from EDS on 07/01/2013  FL2 transmitted to all facilities in geographic area requested by pt/family on  07/02/2013 FL2 transmitted to all facilities within larger geographic area on   Patient informed that his/her managed care company has contracts with or will negotiate with  certain facilities, including the following:     Patient/family informed of bed offers received:  07/02/2013 Patient chooses bed at  Physician recommends and patient chooses bed at    Patient to be transferred to  on   Patient to be transferred to facility by   The following physician request were entered in Epic:   Additional Comments:   Roddie McBryant Edwardine Deschepper, Bryon LionsLCSWA, LCASA, [phone]

## 2013-07-02 NOTE — Progress Notes (Signed)
[facility]

## 2013-07-02 NOTE — Progress Notes (Signed)
  Echocardiogram 2D Echocardiogram has been performed.  Judy Wolf, Judy Wolf 07/02/2013, 9:39 AM

## 2013-07-02 NOTE — Progress Notes (Signed)
Judy FullerHarris M Teutsch to be D/C'd Skilled nursing facility per MD order.  Discussed with the patient and all questions fully answered.    Medication List         acetaminophen 325 MG tablet  Commonly known as:  TYLENOL  Take 650 mg by mouth every 6 (six) hours as needed.     amLODipine 10 MG tablet  Commonly known as:  NORVASC  Take 10 mg by mouth 2 (two) times daily.     benazepril 40 MG tablet  Commonly known as:  LOTENSIN  Take 40 mg by mouth 2 (two) times daily.     calcium acetate 667 MG capsule  Commonly known as:  PHOSLO  Take 667 mg by mouth 3 (three) times daily with meals.     cloNIDine 0.1 MG tablet  Commonly known as:  CATAPRES  Take 0.2 mg by mouth 2 (two) times daily.     HYDROcodone-acetaminophen 5-500 MG per tablet  Commonly known as:  VICODIN  Take 0.5 tablets by mouth daily as needed. For pain     labetalol 200 MG tablet  Commonly known as:  NORMODYNE  Take 1 tablet (200 mg total) by mouth 2 (two) times daily.     multivitamin Tabs tablet  Take 1 tablet by mouth at bedtime.        VVS, Skin clean, dry and intact without evidence of skin break down, no evidence of skin tears noted. IV catheter discontinued intact. Site without signs and symptoms of complications. Dressing and pressure applied.  Pt transported via EMS.   [name], [name] 07/02/2013 5:42 PM

## 2013-07-02 NOTE — Clinical Social Work Psychosocial (Signed)
Clinical Social Work Department BRIEF PSYCHOSOCIAL ASSESSMENT 07/02/2013  Patient:  Judy Wolf,Judy Wolf     Account Number:  [id number]     Admit date:  06/28/2013  Clinical Social Worker:  Lavell LusterAMPBELL,Shantea Poulton BRYANT, LCSWA  Date/Time:  07/02/2013 10:55 AM  Referred by:  Physician  Date Referred:  07/02/2013 Referred for  SNF Placement   Other Referral:   Interview type:  Family Other interview type:   CSW has attempted to assess patient for SNF placement two times, but patient has been away for procedures. CSW spoke with patient's daughter Judy Wolf. Per MD and RN, patient is requesting SNF.    PSYCHOSOCIAL DATA Living Status:  ALONE Admitted from facility:   Level of care:   Primary support name:  Mary Primary support relationship to patient:  CHILD, ADULT Degree of support available:   Support is adequate.    CURRENT CONCERNS Current Concerns  Post-Acute Placement   Other Concerns:    SOCIAL WORK ASSESSMENT / PLAN CSW spoke with daughter via phone as patient and family are not in room. Daughter confirms that she is requesting SNF for patient. Daughter states that patient lives alone and has experienced many falls at home and does not usually tell the family about these falls. Per MD and RN patient is actually requesting SNF placement. CSW still needs PT evaluation for patient. Daughter plans for patient to go to SNF short term and then return home.   Assessment/plan status:  Psychosocial Support/Ongoing Assessment of Needs Other assessment/ plan:   Complete FL2, Fax, PASRR   Information/referral to community resources:   CSW contact information and SNF list given to patient.    PATIENT'S/FAMILY'S RESPONSE TO PLAN OF CARE: Daughter is agreeable to patient going to SNF is appropriate. MD and RN state that patient is requesting SNF. CSW will follow up with patient when she returns from procedure. Duaghter was pleasant, appropriate, and appreciative of CSW contact. CSW will assist with DC.        Roddie McBryant Analiyah Lechuga, [place]CSWA, [place], [phone]

## 2013-07-02 NOTE — Evaluation (Signed)
Physical Therapy Evaluation Patient Details Name: Judy FullerHarris M Wolf MRN: [id number] DOB: [birth year] Today's Date: 07/02/2013 Time: [id number] PT Time Calculation (min): 27 min  PT Assessment / Plan / Recommendation History of Present Illness  Pt adm with syncope.  Clinical Impression  Pt admitted with above. Pt currently with functional limitations due to the deficits listed below (see PT Problem List).  Pt will benefit from skilled PT to increase their independence and safety with mobility to allow discharge to the venue listed below.       PT Assessment  Patient needs continued PT services    Follow Up Recommendations  SNF    Does the patient have the potential to tolerate intense rehabilitation      Barriers to Discharge        Equipment Recommendations  None recommended by PT    Recommendations for Other Services     Frequency Min 3X/week    Precautions / Restrictions Precautions Precautions: Fall Restrictions Weight Bearing Restrictions: No   Pertinent Vitals/Pain See flow sheet.      Mobility  Bed Mobility Overal bed mobility: Modified Independent Transfers Overall transfer level: Needs assistance Equipment used: Rolling walker (2 wheeled);Straight cane;1 person hand held assist Transfers: Sit to/from Stand Sit to Stand: Min assist General transfer comment: Assist to bring hips up and for balance. Ambulation/Gait Ambulation/Gait assistance: Min assist Ambulation Distance (Feet): 150 Feet Assistive device: Rolling walker (2 wheeled);Straight cane;1 person hand held assist Gait Pattern/deviations: Step-through pattern;Decreased stride length;Narrow base of support General Gait Details: Pt unsteady with cane and requiring additional hand held assist for balance.  Attempted using rolling walker but pt having difficulty steering walker so returned to cane and hand-held.    Exercises     PT Diagnosis: Difficulty walking;Generalized weakness  PT Problem List:  Decreased strength;Decreased activity tolerance;Decreased balance;Decreased mobility;Decreased knowledge of use of DME;Decreased knowledge of precautions PT Treatment Interventions: DME instruction;Gait training;Patient/family education;Functional mobility training;Therapeutic activities;Therapeutic exercise;Balance training     PT Goals(Current goals can be found in the care plan section) Acute Rehab PT Goals Patient Stated Goal: to walk PT Goal Formulation: With patient Time For Goal Achievement: 07/09/13 Potential to Achieve Goals: Good  Visit Information  Last PT Received On: 07/02/13 Assistance Needed: +1 History of Present Illness: Pt adm with syncope.       Prior Functioning  Home Living Family/patient expects to be discharged to:: Private residence Living Arrangements: Alone Type of Home: Apartment Home Access: Stairs to enter Entergy CorporationEntrance Stairs-Number of Steps: 4 Entrance Stairs-Rails: Right;Left Home Layout: One level Home Equipment: Cane - single point;Shower seat;Toilet riser Prior Function Level of Independence: Independent with assistive device(s) Comments: Amb with straight cane. Communication Communication: No difficulties    Cognition  Cognition Arousal/Alertness: Awake/alert Behavior During Therapy: WFL for tasks assessed/performed Overall Cognitive Status: Within Functional Limits for tasks assessed    Extremity/Trunk Assessment Upper Extremity Assessment Upper Extremity Assessment: Defer to OT evaluation Lower Extremity Assessment Lower Extremity Assessment: Generalized weakness   Balance Balance Overall balance assessment: Needs assistance Sitting-balance support: No upper extremity supported Sitting balance-[name] Scale: Good Standing balance support: Single extremity supported Standing balance-[name] Scale: Poor  End of Session PT - End of Session Equipment Utilized During Treatment: Gait belt Activity Tolerance: Patient tolerated treatment  well Patient left: in chair;with call bell/phone within reach;with chair alarm set Nurse Communication: Mobility status  GP     [facility],Jadence Kinlaw 07/02/2013, 11:28 AM

## 2013-08-17 ENCOUNTER — Emergency Department (HOSPITAL_COMMUNITY): Payer: Medicare Other

## 2013-08-17 ENCOUNTER — Inpatient Hospital Stay (HOSPITAL_COMMUNITY)
Admission: EM | Admit: 2013-08-17 | Discharge: 2013-08-21 | DRG: 280 | Disposition: A | Discharge status: Skilled Nursing Facility | Payer: Medicare Other | Attending: Internal Medicine | Admitting: Internal Medicine

## 2013-08-17 ENCOUNTER — Encounter (HOSPITAL_COMMUNITY): Payer: Self-pay | Admitting: Emergency Medicine

## 2013-08-17 DIAGNOSIS — Z86718 Personal history of other venous thrombosis and embolism: Secondary | ICD-10-CM

## 2013-08-17 DIAGNOSIS — J810 Acute pulmonary edema: Secondary | ICD-10-CM | POA: Diagnosis present

## 2013-08-17 DIAGNOSIS — Z8673 Personal history of transient ischemic attack (TIA), and cerebral infarction without residual deficits: Secondary | ICD-10-CM

## 2013-08-17 DIAGNOSIS — I120 Hypertensive chronic kidney disease with stage 5 chronic kidney disease or end stage renal disease: Secondary | ICD-10-CM | POA: Diagnosis present

## 2013-08-17 DIAGNOSIS — I2510 Atherosclerotic heart disease of native coronary artery without angina pectoris: Secondary | ICD-10-CM | POA: Diagnosis present

## 2013-08-17 DIAGNOSIS — R4181 Age-related cognitive decline: Secondary | ICD-10-CM | POA: Diagnosis present

## 2013-08-17 DIAGNOSIS — E875 Hyperkalemia: Secondary | ICD-10-CM | POA: Diagnosis present

## 2013-08-17 DIAGNOSIS — R079 Chest pain, unspecified: Secondary | ICD-10-CM

## 2013-08-17 DIAGNOSIS — Z992 Dependence on renal dialysis: Secondary | ICD-10-CM | POA: Diagnosis present

## 2013-08-17 DIAGNOSIS — E43 Unspecified severe protein-calorie malnutrition: Secondary | ICD-10-CM | POA: Diagnosis present

## 2013-08-17 DIAGNOSIS — E873 Alkalosis: Secondary | ICD-10-CM

## 2013-08-17 DIAGNOSIS — I214 Non-ST elevation (NSTEMI) myocardial infarction: Principal | ICD-10-CM | POA: Diagnosis present

## 2013-08-17 DIAGNOSIS — I10 Essential (primary) hypertension: Secondary | ICD-10-CM | POA: Diagnosis present

## 2013-08-17 DIAGNOSIS — E119 Type 2 diabetes mellitus without complications: Secondary | ICD-10-CM | POA: Diagnosis present

## 2013-08-17 DIAGNOSIS — R7989 Other specified abnormal findings of blood chemistry: Secondary | ICD-10-CM

## 2013-08-17 DIAGNOSIS — E876 Hypokalemia: Secondary | ICD-10-CM | POA: Diagnosis not present

## 2013-08-17 DIAGNOSIS — R109 Unspecified abdominal pain: Secondary | ICD-10-CM | POA: Diagnosis present

## 2013-08-17 DIAGNOSIS — I161 Hypertensive emergency: Secondary | ICD-10-CM | POA: Diagnosis present

## 2013-08-17 DIAGNOSIS — J811 Chronic pulmonary edema: Secondary | ICD-10-CM

## 2013-08-17 DIAGNOSIS — N2581 Secondary hyperparathyroidism of renal origin: Secondary | ICD-10-CM | POA: Diagnosis present

## 2013-08-17 DIAGNOSIS — R112 Nausea with vomiting, unspecified: Secondary | ICD-10-CM | POA: Diagnosis present

## 2013-08-17 DIAGNOSIS — Z79899 Other long term (current) drug therapy: Secondary | ICD-10-CM

## 2013-08-17 DIAGNOSIS — D649 Anemia, unspecified: Secondary | ICD-10-CM | POA: Diagnosis present

## 2013-08-17 DIAGNOSIS — E785 Hyperlipidemia, unspecified: Secondary | ICD-10-CM | POA: Diagnosis present

## 2013-08-17 DIAGNOSIS — N186 End stage renal disease: Secondary | ICD-10-CM | POA: Diagnosis present

## 2013-08-17 DIAGNOSIS — F0390 Unspecified dementia without behavioral disturbance: Secondary | ICD-10-CM | POA: Diagnosis present

## 2013-08-17 DIAGNOSIS — Z86711 Personal history of pulmonary embolism: Secondary | ICD-10-CM

## 2013-08-17 LAB — CBC WITH DIFFERENTIAL/PLATELET
BASOS ABS: 0 10*3/uL (ref 0.0–0.1)
Basophils Relative: 0 % (ref 0–1)
Eosinophils Absolute: 0 10*3/uL (ref 0.0–0.7)
Eosinophils Relative: 0 % (ref 0–5)
HEMATOCRIT: 38.9 % (ref 36.0–46.0)
HEMOGLOBIN: 12.8 g/dL (ref 12.0–15.0)
LYMPHS PCT: 14 % (ref 12–46)
Lymphs Abs: 1.3 10*3/uL (ref 0.7–4.0)
MCH: 30.2 pg (ref 26.0–34.0)
MCHC: 32.9 g/dL (ref 30.0–36.0)
MCV: 91.7 fL (ref 78.0–100.0)
MONO ABS: 0.7 10*3/uL (ref 0.1–1.0)
Monocytes Relative: 8 % (ref 3–12)
NEUTROS ABS: 6.9 10*3/uL (ref 1.7–7.7)
Neutrophils Relative %: 78 % — ABNORMAL HIGH (ref 43–77)
Platelets: 375 10*3/uL (ref 150–400)
RBC: 4.24 MIL/uL (ref 3.87–5.11)
RDW: 15 % (ref 11.5–15.5)
WBC: 8.9 10*3/uL (ref 4.0–10.5)

## 2013-08-17 LAB — URINALYSIS, ROUTINE W REFLEX MICROSCOPIC
Bilirubin Urine: NEGATIVE
Glucose, UA: NEGATIVE mg/dL
Hgb urine dipstick: NEGATIVE
KETONES UR: NEGATIVE mg/dL
Leukocytes, UA: NEGATIVE
NITRITE: NEGATIVE
Specific Gravity, Urine: 1.018 (ref 1.005–1.030)
Urobilinogen, UA: 0.2 mg/dL (ref 0.0–1.0)
pH: 8 (ref 5.0–8.0)

## 2013-08-17 LAB — POTASSIUM: Potassium: 3.5 mEq/L — ABNORMAL LOW (ref 3.7–5.3)

## 2013-08-17 LAB — I-STAT CHEM 8, ED
BUN: 68 mg/dL — AB (ref 6–23)
CALCIUM ION: 1.09 mmol/L — AB (ref 1.13–1.30)
CHLORIDE: 97 meq/L (ref 96–112)
Creatinine, Ser: 7.5 mg/dL — ABNORMAL HIGH (ref 0.50–1.10)
Glucose, Bld: 125 mg/dL — ABNORMAL HIGH (ref 70–99)
HCT: 42 % (ref 36.0–46.0)
Hemoglobin: 14.3 g/dL (ref 12.0–15.0)
POTASSIUM: 6.5 meq/L — AB (ref 3.7–5.3)
Sodium: 136 mEq/L — ABNORMAL LOW (ref 137–147)
TCO2: 33 mmol/L (ref 0–100)

## 2013-08-17 LAB — HEPATIC FUNCTION PANEL
ALT: 24 U/L (ref 0–35)
AST: 39 U/L — ABNORMAL HIGH (ref 0–37)
Albumin: 3.6 g/dL (ref 3.5–5.2)
Alkaline Phosphatase: 55 U/L (ref 39–117)
Bilirubin, Direct: <0.2 mg/dL (ref 0.0–0.3)
TOTAL PROTEIN: 7.4 g/dL (ref 6.0–8.3)
Total Bilirubin: 0.4 mg/dL (ref 0.3–1.2)

## 2013-08-17 LAB — LIPASE, BLOOD: Lipase: 33 U/L (ref 11–59)

## 2013-08-17 LAB — I-STAT TROPONIN, ED: TROPONIN I, POC: 0.54 ng/mL — AB (ref 0.00–0.08)

## 2013-08-17 LAB — BASIC METABOLIC PANEL
BUN: 48 mg/dL — ABNORMAL HIGH (ref 6–23)
CO2: 28 mEq/L (ref 19–32)
Calcium: 10.2 mg/dL (ref 8.4–10.5)
Chloride: 91 mEq/L — ABNORMAL LOW (ref 96–112)
Creatinine, Ser: 7.44 mg/dL — ABNORMAL HIGH (ref 0.50–1.10)
GFR calc non Af Amer: 5 mL/min — ABNORMAL LOW (ref >90)
GFR, EST AFRICAN AMERICAN: 5 mL/min — AB (ref >90)
Glucose, Bld: 125 mg/dL — ABNORMAL HIGH (ref 70–99)
POTASSIUM: 5.8 meq/L — AB (ref 3.7–5.3)
SODIUM: 141 meq/L (ref 137–147)

## 2013-08-17 LAB — I-STAT CG4 LACTIC ACID, ED: Lactic Acid, Venous: 1.92 mmol/L (ref 0.5–2.2)

## 2013-08-17 LAB — TROPONIN I: Troponin I: 0.65 ng/mL (ref <0.30)

## 2013-08-17 LAB — URINE MICROSCOPIC-ADD ON

## 2013-08-17 LAB — PRO B NATRIURETIC PEPTIDE: Pro B Natriuretic peptide (BNP): >70000 pg/mL — ABNORMAL HIGH (ref 0–450)

## 2013-08-17 LAB — MRSA PCR SCREENING: MRSA by PCR: NEGATIVE

## 2013-08-17 LAB — CK: Total CK: 92 U/L (ref 7–177)

## 2013-08-17 MED ORDER — SODIUM CHLORIDE 0.9 % IV SOLN
100.0000 mL | INTRAVENOUS | Status: DC | PRN
Start: 2013-08-17 — End: 2013-08-17

## 2013-08-17 MED ORDER — LIDOCAINE-PRILOCAINE 2.5-2.5 % EX CREA
1.0000 "application " | TOPICAL_CREAM | CUTANEOUS | Status: DC | PRN
  Filled 2013-08-17: qty 5

## 2013-08-17 MED ORDER — HEPARIN SODIUM (PORCINE) 5000 UNIT/ML IJ SOLN
5000.0000 [IU] | Freq: Three times a day (TID) | INTRAMUSCULAR | Status: DC
  Administered 2013-08-17 – 2013-08-21 (×10): 5000 [IU] via SUBCUTANEOUS
  Filled 2013-08-17 (×14): qty 1

## 2013-08-17 MED ORDER — PANTOPRAZOLE SODIUM 40 MG IV SOLR
40.0000 mg | Freq: Every day | INTRAVENOUS | Status: DC
  Administered 2013-08-17: 40 mg via INTRAVENOUS
  Filled 2013-08-17 (×2): qty 40

## 2013-08-17 MED ORDER — ALTEPLASE 2 MG IJ SOLR
2.0000 mg | Freq: Once | INTRAMUSCULAR | Status: DC | PRN
  Filled 2013-08-17: qty 2

## 2013-08-17 MED ORDER — PENTAFLUOROPROP-TETRAFLUOROETH EX AERO: 1.0000 "application " | INHALATION_SPRAY | CUTANEOUS | Status: DC | PRN

## 2013-08-17 MED ORDER — ATORVASTATIN CALCIUM 20 MG PO TABS
20.0000 mg | ORAL_TABLET | Freq: Every day | ORAL | Status: DC
  Administered 2013-08-17 – 2013-08-20 (×3): 20 mg via ORAL
  Filled 2013-08-17 (×6): qty 1

## 2013-08-17 MED ORDER — NITROGLYCERIN 0.4 MG SL SUBL
0.4000 mg | SUBLINGUAL_TABLET | SUBLINGUAL | Status: DC | PRN
  Administered 2013-08-17: 0.4 mg via SUBLINGUAL

## 2013-08-17 MED ORDER — SODIUM CHLORIDE 0.9 % IV SOLN: 250.0000 mL | INTRAVENOUS | Status: DC | PRN

## 2013-08-17 MED ORDER — MORPHINE SULFATE 4 MG/ML IJ SOLN
4.0000 mg | Freq: Once | INTRAMUSCULAR | Status: AC
  Administered 2013-08-17: 4 mg via INTRAVENOUS
  Filled 2013-08-17: qty 1

## 2013-08-17 MED ORDER — AMLODIPINE BESYLATE 10 MG PO TABS
10.0000 mg | ORAL_TABLET | Freq: Two times a day (BID) | ORAL | Status: DC
  Filled 2013-08-17: qty 1

## 2013-08-17 MED ORDER — ASPIRIN 300 MG RE SUPP
300.0000 mg | RECTAL | Status: AC
  Filled 2013-08-17: qty 1

## 2013-08-17 MED ORDER — ONDANSETRON HCL 4 MG/2ML IJ SOLN
4.0000 mg | Freq: Once | INTRAMUSCULAR | Status: AC
  Administered 2013-08-17: 4 mg via INTRAVENOUS
  Filled 2013-08-17: qty 2

## 2013-08-17 MED ORDER — ASPIRIN 81 MG PO CHEW: 324.0000 mg | CHEWABLE_TABLET | ORAL | Status: AC

## 2013-08-17 MED ORDER — HYDROCODONE-ACETAMINOPHEN 5-325 MG PO TABS: 0.5000 | ORAL_TABLET | Freq: Two times a day (BID) | ORAL | Status: DC | PRN

## 2013-08-17 MED ORDER — HEPARIN SODIUM (PORCINE) 1000 UNIT/ML DIALYSIS: 1000.0000 [IU] | INTRAMUSCULAR | Status: DC | PRN

## 2013-08-17 MED ORDER — HEPARIN SODIUM (PORCINE) 1000 UNIT/ML DIALYSIS: 1500.0000 [IU] | Freq: Once | INTRAMUSCULAR | Status: DC

## 2013-08-17 MED ORDER — CARVEDILOL 6.25 MG PO TABS
6.2500 mg | ORAL_TABLET | Freq: Two times a day (BID) | ORAL | Status: DC
  Administered 2013-08-18: 6.25 mg via ORAL
  Filled 2013-08-17 (×4): qty 1

## 2013-08-17 MED ORDER — AMLODIPINE BESYLATE 10 MG PO TABS
10.0000 mg | ORAL_TABLET | Freq: Every day | ORAL | Status: DC
  Administered 2013-08-17 – 2013-08-20 (×4): 10 mg via ORAL
  Filled 2013-08-17 (×5): qty 1

## 2013-08-17 MED ORDER — LIDOCAINE HCL (PF) 1 % IJ SOLN: 5.0000 mL | INTRAMUSCULAR | Status: DC | PRN

## 2013-08-17 MED ORDER — DOXERCALCIFEROL 4 MCG/2ML IV SOLN
1.0000 ug | INTRAVENOUS | Status: DC
  Administered 2013-08-19 – 2013-08-21 (×2): 1 ug via INTRAVENOUS
  Filled 2013-08-17 (×2): qty 2

## 2013-08-17 MED ORDER — ASPIRIN 325 MG PO TABS
325.0000 mg | ORAL_TABLET | ORAL | Status: AC
  Administered 2013-08-17: 325 mg via ORAL
  Filled 2013-08-17: qty 1

## 2013-08-17 MED ORDER — LABETALOL HCL 5 MG/ML IV SOLN
10.0000 mg | INTRAVENOUS | Status: DC | PRN
  Filled 2013-08-17: qty 4

## 2013-08-17 MED ORDER — HYDRALAZINE HCL 20 MG/ML IJ SOLN: 10.0000 mg | INTRAMUSCULAR | Status: DC | PRN

## 2013-08-17 MED ORDER — NITROGLYCERIN IN D5W 200-5 MCG/ML-% IV SOLN
5.0000 ug/min | INTRAVENOUS | Status: DC
  Administered 2013-08-17: 5 ug/min via INTRAVENOUS
  Filled 2013-08-17: qty 250

## 2013-08-17 MED ORDER — NEPRO/CARBSTEADY PO LIQD
237.0000 mL | ORAL | Status: DC | PRN
  Filled 2013-08-17: qty 237

## 2013-08-17 MED ORDER — SODIUM CHLORIDE 0.9 % IV SOLN: 100.0000 mL | INTRAVENOUS | Status: DC | PRN

## 2013-08-17 MED ORDER — NITROGLYCERIN IN D5W 200-5 MCG/ML-% IV SOLN: 2.0000 ug/min | INTRAVENOUS | Status: DC

## 2013-08-17 MED ORDER — ACETAMINOPHEN 500 MG PO TABS
1000.0000 mg | ORAL_TABLET | Freq: Two times a day (BID) | ORAL | Status: DC | PRN
  Administered 2013-08-19: 1000 mg via ORAL
  Filled 2013-08-17: qty 2

## 2013-08-17 MED ORDER — BENAZEPRIL HCL 40 MG PO TABS
40.0000 mg | ORAL_TABLET | Freq: Two times a day (BID) | ORAL | Status: DC
  Administered 2013-08-17 – 2013-08-20 (×6): 40 mg via ORAL
  Filled 2013-08-17 (×9): qty 1

## 2013-08-17 MED ORDER — CLONIDINE HCL 0.2 MG PO TABS
0.2000 mg | ORAL_TABLET | Freq: Two times a day (BID) | ORAL | Status: DC
  Administered 2013-08-17 – 2013-08-21 (×7): 0.2 mg via ORAL
  Filled 2013-08-17 (×9): qty 1

## 2013-08-17 MED ORDER — KETOROLAC TROMETHAMINE 30 MG/ML IJ SOLN: 15.0000 mg | Freq: Once | INTRAMUSCULAR | Status: DC

## 2013-08-17 MED ORDER — ONDANSETRON HCL 4 MG/2ML IJ SOLN: 4.0000 mg | Freq: Three times a day (TID) | INTRAMUSCULAR | Status: DC | PRN

## 2013-08-17 NOTE — Consult Note (Signed)
Cardiology Consultation Note  Patient ID: Judy Wolf, MRN: [id number], DOB/AGE: 1928-10-21 80 y.o. Admit date: 08/17/2013   Date of Consult: 08/17/2013 Primary Physician: Ailene Ravel, MD Primary Cardiologist: None.  Chief Complaint: Nausea and vomiting. Reason for Consult: Hypertensive emergency.  HPI: Judy Wolf is an 80 year old woman with ESRD on HD, prior CVA and multiple TIA's, and prior DVT/PE, whom we have been asked to evaluate due to markedly elevated blood pressure with chest pain and troponin elevation.  The patient states that she began having abdominal pain and nausea yesterday morning, which worsened when she went to her scheduled HD session.  She reports vomiting several times during HD.  At the time, she also had chest pain that she describes as a sharp, constant pain.  It initially began at rest and was present until she presented to the ED this afternoon.  The pain was non-radiating and without associated shortness of breath.  The patient endorses occasional brief episodes of chest pain in the past, though she cannot quantify how frequently these occur or when they first began.  Due to her GI symptoms, the patient last took her normal medications (including antihypertensive agents) 3 days ago.  In the ED, the patient was found to be severely hypertensive with a blood pressure as high as 244/89.  She was also found to be hyperkalemic with a potassium of 6.5 (repeat 5.8).  The patient was therefore started on a nitroglycerin infusion and evaluated by nephrology for urgent hemodialysis.  At this time, the patient is asymptomatic, with resolution of her chest pain, nausea, and abdominal pain.  The patient denies a history of prior cardiac disease; she underwent a myocardial perfusion stress test in 10/2009, which did not reveal any ischemia or scar.  Her mobility is quite limited and she is unable to perform more than 4 METs of activity.  Her most strenuous activity in the last month has  been grocery shopping, which she is able to do without symptoms (e.g. dyspnea and chest pain).  Past Medical History  Diagnosis Date  . Hypertension   . Renal disorder   . TIA (transient ischemic attack)   . Arthritis   . Blood transfusion   . Aneurysm     brain  . DVT (deep venous thrombosis)       Most Recent Cardiac Studies: TTE (07/04/2013): Normal biventricular systolic function (LVEF 55-60%), moderate LVH, trivial AI, and mild left atrial enlargement.  Myocardial perfusion stress test (11/11/2009): No evidence if inducible ischemia or scar.  LVEF 76%.   Surgical History:  Past Surgical History  Procedure Laterality Date  . Cholecystectomy  08/02/2011    Procedure: LAPAROSCOPIC CHOLECYSTECTOMY WITH INTRAOPERATIVE CHOLANGIOGRAM;  Surgeon: Currie Paris, MD;  Location: [facility] OR;  Service: General;  Laterality: N/A;  . Abdominal hysterectomy       Home Meds: Prior to Admission medications   Medication Sig Start Date Bhavya Eschete Date Taking? Authorizing Provider  acetaminophen (TYLENOL) 500 MG tablet Take 1,000 mg by mouth 2 (two) times daily as needed (pain).   Yes Historical Provider, MD  amLODipine (NORVASC) 10 MG tablet Take 10 mg by mouth 2 (two) times daily.   Yes Historical Provider, MD  benazepril (LOTENSIN) 40 MG tablet Take 40 mg by mouth 2 (two) times daily.   Yes Historical Provider, MD  calcium acetate (PHOSLO) 667 MG capsule Take 667 mg by mouth 3 (three) times daily with meals.   Yes Historical Provider, MD  cloNIDine (CATAPRES) 0.1 MG  tablet Take 0.2 mg by mouth 2 (two) times daily.   Yes Historical Provider, MD  HYDROcodone-acetaminophen (NORCO/VICODIN) 5-325 MG per tablet Take 0.5 tablets by mouth 2 (two) times daily as needed (pain).   Yes Historical Provider, MD  multivitamin (RENA-VIT) TABS tablet Take 1 tablet by mouth at bedtime. 11/25/12  Yes Calvert Cantor, MD    Inpatient Medications:  . amLODipine  10 mg Oral QHS  . benazepril  40 mg Oral BID  . cloNIDine  0.2  mg Oral BID  . [START ON 08/19/2013] doxercalciferol  1 mcg Intravenous Q M,W,F-HD   . nitroGLYCERIN 25 mcg/min (08/17/13 1745)    Allergies: No Known Allergies  History   Social History  . Marital Status: Widowed    Spouse Name: N/A    Number of Children: N/A  . Years of Education: N/A   Occupational History  . Not on file.   Social History Main Topics  . Smoking status: Never Smoker   . Smokeless tobacco: Not on file  . Alcohol Use: No  . Drug Use: No  . Sexual Activity: Not on file   Other Topics Concern  . Not on file   Social History Narrative  . No narrative on file     No family history on file.   Review of Systems: A 10-systems review of systems was performed and was negative, except as noted in the HPI.  Labs: POC troponin: 0.54  Lab Results  Component Value Date   WBC 8.9 08/17/2013   HGB 14.3 08/17/2013   HCT 42.0 08/17/2013   MCV 91.7 08/17/2013   PLT 375 08/17/2013    Recent Labs Lab 08/17/13 1525  NA 141  K 5.8*  CL 91*  CO2 28  BUN 48*  CREATININE 7.44*  CALCIUM 10.2  PROT 7.4  BILITOT 0.4  ALKPHOS 55  ALT 24  AST 39*  GLUCOSE 125*   Lab Results  Component Value Date   CHOL  Value: 141        ATP III CLASSIFICATION:  <200     mg/dL   Desirable  161-096  mg/dL   Borderline High  >=045    mg/dL   High        4/0/9811   HDL 76 11/19/2009   LDLCALC  Value: 52        Total Cholesterol/HDL:CHD Risk Coronary Heart Disease Risk Table                     Men   Women  1/2 Average Risk   3.4   3.3  Average Risk       5.0   4.4  2 X Average Risk   9.6   7.1  3 X Average Risk  23.4   11.0        Use the calculated Patient Ratio above and the CHD Risk Table to determine the patient's CHD Risk.        ATP III CLASSIFICATION (LDL):  <100     mg/dL   Optimal  914-782  mg/dL   Near or Above                    Optimal  130-159  mg/dL   Borderline  956-213  mg/dL   High  >086     mg/dL   Very High 10/25/8467   TRIG 65 11/19/2009    Radiology/Studies:  Dg Chest  Portable 1 View  08/17/2013  CLINICAL DATA:  Abdominal pain, increase in blood pressure  EXAM: PORTABLE CHEST - 1 VIEW  COMPARISON:  06/29/2013  FINDINGS: Cardiomegaly again noted. Atherosclerotic calcifications of thoracic aorta. Slight worsening interstitial prominence bilaterally. Mild edema or pneumonitis cannot be excluded. No segmental infiltrate.  IMPRESSION: Slight worsening interstitial prominence bilaterally. Mild edema or pneumonitis cannot be excluded. No segmental infiltrate. Follow-up examination is recommended.   Electronically Signed   By: Natasha MeadLiviu  Pop M.D.   On: 08/17/2013 15:11    EKG: Sinus tachycardia with LVH and prominent T-wave inversions in the anterolateral and inferior leads.  T-wave inversions inferiorly are new.  Physical Exam: Blood pressure 200/75, pulse 97, temperature 98.9 F (37.2 C), temperature source Oral, resp. rate 16, SpO2 98.00%. General: Frail, elderly woman lying in bed. Head: Normocephalic, atraumatic, sclera non-icteric, no xanthomas, nares are without discharge.  Neck: JVP 8-10 cm Lungs: Coarse breath sounds  Heart: RRR with S1 S2. No murmurs, rubs, or gallops appreciated. Abdomen: Soft, non-tender, non-distended with normoactive bowel sounds. No hepatomegaly. No rebound/guarding. No obvious abdominal masses. Msk:  Strength and tone appear normal for age. Extremities: No clubbing or cyanosis. No edema.  Distal pedal pulses are 2+ and equal bilaterally. LUE HD fistula with palpable thrill. Neuro: Alert and oriented X 3. No facial asymmetry. No focal deficit. Moves all extremities spontaneously. Psych:  Responds to questions appropriately with a normal affect.    Assessment and Plan: 80 y/o woman with ESRD on HD, HTN, cerebrovascular disease, and prior DVT/PE, whom we have been asked to evaluate for chest pain and troponin elevation the setting of markedly elevated blood pressure.  This findings are consistent with hypertensive emergency, warranting  aggressive intervention.  The patient is currently asymptomatic, though her blood pressure remains suboptimally controlled at this time.  Her preceding chest pain most likely reflects ischemia due to supply-demand mismatch.  Her EKG demonstrates prominent T-wave inversions, which could be seen with widespread ischemia, though in the setting of her tachycardia, hypertension, and electrolyte abnormalities in the setting of LVH, the T-wave inversions are non-specific.  Other causes for the patient's chest pain include PE (given her history of prior VTE) and aortic dissection, given her marked hypertension.  However, given the interval resolution of the patient's chest pain, the latter is less likely.  Hypertensive emergency:  Given the evidence for Josilyn Shippee-organ dysfunction (chest pain and elevated troponin), the patient's blood pressure should continue to be treated aggressively.  Urgent hemodialysis, as you are doing, will hopefully help lower the patient's BP.  - Continue with HD  - Titrate NTG infusion for SBP < 180  - Restart home medications, including clonidine, as the patient could be experiencing rebound hypertension after having missed several doses.  - Recommend initiation of oral beta-blocker, such as carvedilol 6.25 mg BID with uptitration as heart rate allows.  Chest pain and troponin elevation: This most likely reflects NSTEMI due to supply-demand mismatch, rather than an acute plaque rupture event.  However, the patient has several risk factors for CAD.  - Repeat troponins x 3 or until they have peaked.  - Pt already receive ASA 325 mg x 1; please start ASA 81 mg daily  - Please obtain fasting lipid panel, TSH, and hemoglobin A1c for further risk stratification.  - Patient will likely need ischemia evaluation once her blood pressure and electrolyte disturbances have been been addressed.  Will defer decision for left heart catheterization versus myocardial perfusion stress test, based troponin  trend.  - If chest pain  recurs, would have low threshold for CTA chest/abdomen to exclude aortic dissection.  This would also be helpful in excluding a large PE, given patient's history of VTE.  - Would avoid systemic anticoagulation (i.e. heparin infusion) until BP is better controlled, due to increased risk for bleeding.  - Consider initiation of medium-potency statin therapy (e.g. atorvastatin 20 mg daily), given patient's risk factors.  ESRD: HD cut short yesterday due to nausea and vomiting.  - Undergoing HD at this time.  Defer further management of electroyles to primary team and nephrology.   Signed, Corrisa Gibby A. MD 08/17/2013, 9:21 PM

## 2013-08-17 NOTE — H&P (Signed)
Name: Judy FullerHarris M Dubinsky MRN: [id number] DOB: [birth year]    ADMISSION DATE:  08/17/2013 CONSULTATION DATE:  08/17/2013  REFERRING MD :  EDP PRIMARY SERVICE: PCCM  CHIEF COMPLAINT:  Chest pain, high BP  BRIEF PATIENT DESCRIPTION: 80 y.o ESRD on HD X 6y (M/W/F) admitted with nausea, vomiting, sub sternal chest pain & hypertensive emergency with nstemi. Started on IV NTG & taken to emergent HD for hyperkalemia, BP 241/95 on arrival  PMhx of TIA's, ASCVD, hx CVA 2011, DVT/PE in 2010   SIGNIFICANT EVENTS / STUDIES:  2/27 HD x 2h only- stopped due to nausea/Vomiting  LINES / TUBES: LUE AVf  CULTURES:   ANTIBIOTICS:   HISTORY OF PRESENT ILLNESS:  80 y.o ESRD on HD X 6y (M/W/F) admitted with nausea, vomiting, sub sternal chest pain & hypertensive emergency with nstemi - EKG -? Strain pattern with LVH vs lateral wall ischemia, Tp 0.54. Received 4 mg morphine with pain relief, Did not complete HD on Friday due to vomiting, Labs showed K 6.5 --> 5.8, no hyperkalemic T waves CXR showed interstitial edema She has difficult to control htn requiring 4 meds, recent adm jan 2015 for syncope during HD noted  PMhx of TIA's, ASCVD, hx CVA 2011, DVT/PE in 2010  PAST MEDICAL HISTORY :  Past Medical History  Diagnosis Date  . Hypertension   . Renal disorder   . TIA (transient ischemic attack)   . Arthritis   . Blood transfusion   . Aneurysm     brain  . DVT (deep venous thrombosis)    Past Surgical History  Procedure Laterality Date  . Cholecystectomy  08/02/2011    Procedure: LAPAROSCOPIC CHOLECYSTECTOMY WITH INTRAOPERATIVE CHOLANGIOGRAM;  Surgeon: Currie Parishristian J Streck, MD;  Location: [facility] OR;  Service: General;  Laterality: N/A;  . Abdominal hysterectomy     Prior to Admission medications   Medication Sig Start Date End Date Taking? Authorizing Provider  acetaminophen (TYLENOL) 500 MG tablet Take 1,000 mg by mouth 2 (two) times daily as needed (pain).   Yes Historical Provider, MD    amLODipine (NORVASC) 10 MG tablet Take 10 mg by mouth 2 (two) times daily.   Yes Historical Provider, MD  benazepril (LOTENSIN) 40 MG tablet Take 40 mg by mouth 2 (two) times daily.   Yes Historical Provider, MD  calcium acetate (PHOSLO) 667 MG capsule Take 667 mg by mouth 3 (three) times daily with meals.   Yes Historical Provider, MD  cloNIDine (CATAPRES) 0.1 MG tablet Take 0.2 mg by mouth 2 (two) times daily.   Yes Historical Provider, MD  HYDROcodone-acetaminophen (NORCO/VICODIN) 5-325 MG per tablet Take 0.5 tablets by mouth 2 (two) times daily as needed (pain).   Yes Historical Provider, MD  multivitamin (RENA-VIT) TABS tablet Take 1 tablet by mouth at bedtime. 11/25/12  Yes Calvert CantorSaima Rizwan, MD   No Known Allergies  FAMILY HISTORY:  No family history on file. SOCIAL HISTORY:  reports that she has never smoked. She does not have any smokeless tobacco history on file. She reports that she does not drink alcohol or use illicit drugs.  REVIEW OF SYSTEMS:   CP improved, no radiation to back Nausea improved Epigastric pain mild No diarrhea Breathing ok, no cough No blurred vision, headache or focal weakness   SUBJECTIVE:   VITAL SIGNS: Temp:  [98.9 F (37.2 C)] 98.9 F (37.2 C) (02/28 1348) Pulse Rate:  [97-112] 97 (02/28 1730) Resp:  [15-28] 16 (02/28 1730) BP: (174-244)/(75-105) 200/75 mmHg (02/28 1730) SpO2:  [  88 %-100 %] 98 % (02/28 1730) HEMODYNAMICS:   VENTILATOR SETTINGS:   INTAKE / OUTPUT: Intake/Output   None     PHYSICAL EXAMINATION: Gen. Pleasant, well-nourished, in no distress, normal affect ENT - no lesions, no post nasal drip Neck: No JVD, no thyromegaly, no carotid bruits Lungs: no use of accessory muscles, no dullness to percussion, bibasal rales, no rhonchi  Cardiovascular: Rhythm regular, heart sounds  normal, ESM 2/6 at base, no peripheral edema Abdomen: soft and non-tender, no hepatosplenomegaly, BS normal. Musculoskeletal: No deformities, no cyanosis  or clubbing, LUE AVF - good thrill Neuro:  alert, non focal Skin:  Warm, no lesions/ rash   LABS:  CBC  Recent Labs Lab 08/17/13 1348 08/17/13 1409  WBC 8.9  --   HGB 12.8 14.3  HCT 38.9 42.0  PLT 375  --    Coag's No results found for this basename: APTT, INR,  in the last 168 hours BMET  Recent Labs Lab 08/17/13 1409 08/17/13 1525  NA 136* 141  K 6.5* 5.8*  CL 97 91*  CO2  --  28  BUN 68* 48*  CREATININE 7.50* 7.44*  GLUCOSE 125* 125*   Electrolytes  Recent Labs Lab 08/17/13 1525  CALCIUM 10.2   Sepsis Markers  Recent Labs Lab 08/17/13 1409  LATICACIDVEN 1.92   ABG No results found for this basename: PHART, PCO2ART, PO2ART,  in the last 168 hours Liver Enzymes  Recent Labs Lab 08/17/13 1525  AST 39*  ALT 24  ALKPHOS 55  BILITOT 0.4  ALBUMIN 3.6   Cardiac Enzymes No results found for this basename: TROPONINI, PROBNP,  in the last 168 hours Glucose No results found for this basename: GLUCAP,  in the last 168 hours  Imaging Dg Chest Portable 1 View  08/17/2013   CLINICAL DATA:  Abdominal pain, increase in blood pressure  EXAM: PORTABLE CHEST - 1 VIEW  COMPARISON:  06/29/2013  FINDINGS: Cardiomegaly again noted. Atherosclerotic calcifications of thoracic aorta. Slight worsening interstitial prominence bilaterally. Mild edema or pneumonitis cannot be excluded. No segmental infiltrate.  IMPRESSION: Slight worsening interstitial prominence bilaterally. Mild edema or pneumonitis cannot be excluded. No segmental infiltrate. Follow-up examination is recommended.   Electronically Signed   By: Natasha Mead M.D.   On: 08/17/2013 15:11     CXR: mild edema  ASSESSMENT / PLAN:  PULMONARY A:Interstitial edema P:   HD  CARDIOVASCULAR A: Hypertensive crisis CAD, NSTEMI doubt dissection P:  IV NTG , expect bp to decrease with HD Resume home meds - amlodipin, benaepril, clonidine , labetalol (she has not filled this Rx) Use labetalol & hydralazine  prn to come off gtt ASA, cards input  RENAL A:  ESRD Hyperkalemia P:   Emergent HD  GASTROINTESTINAL A:  N/V ? Gastritis vs related to cardiac ischemia P:   Chk lipase  HEMATOLOGIC A:  No issues P:    INFECTIOUS A:  No issues P:     ENDOCRINE A:  No issues   P:     NEUROLOGIC A:  H/o TIA P:   ASA  TODAY'S SUMMARY: Taper off NTG gtt after HD, aim for 25% MAp reduction -goal 100-110, with IV meds, doubt will tolerate a lot PO, doubt dissection but low threshold to scan if pain persists, fu trops for NSTEMI   I have personally obtained a history, examined the patient, evaluated laboratory and imaging results, formulated the assessment and plan and placed orders. CRITICAL CARE: The patient is critically ill with multiple  organ systems failure and requires high complexity decision making for assessment and support, frequent evaluation and titration of therapies, application of advanced monitoring technologies and extensive interpretation of multiple databases. Critical Care Time devoted to patient care services described in this note is 60 minutes.    Pulmonary and Critical Care Medicine [facility] Pager: [phone]  08/17/2013, 5:40 PM

## 2013-08-17 NOTE — ED Notes (Signed)
[facility] lab called to add on BMP

## 2013-08-17 NOTE — ED Notes (Signed)
Fayrene HelperBowie Tran PA shown results of POC Lactic Acid and Chem8. ED-Lab

## 2013-08-17 NOTE — ED Notes (Signed)
Fayrene HelperBowie Tran PA given results of Istat Tropnin POC. ED-Lab.

## 2013-08-17 NOTE — ED Provider Notes (Signed)
CSN: [id number]     Arrival date & time 08/17/13  1337 History   First MD Initiated Contact with Patient 08/17/13 1338     Chief Complaint  Patient presents with  . Abdominal Pain     (Consider location/radiation/quality/duration/timing/severity/associated sxs/prior Treatment) HPI  80 year old female with history of hypertension, end-stage renal disease currently on Monday Wednesday Friday dialysis, TIA, brain aneurysm who presents for evaluation of abdominal pain. Patient was brought here via EMS from home. Patient reports she has had persistent left-sided chest pain which she described as a sharp sensation, has been ongoing since yesterday afternoon. Pain is getting progressively worse. She endorses lightheadedness, dizziness, nauseous, having shortness of breath. Pain also radiates down to the abdomen. Pain was presents while she was having her dialysis yesterday. She was unable to finish her dialysis due to the discomfort. She continues to have pain throughout the day without any specific treatment tried. She denies prior history of heart attack. She denies having similar pain like this before. No fevers but endorsed chills. Denies headache, runny nose, sneezing, cough, hemoptysis, neck pain, back pain, dysuria, or rash  Past Medical History  Diagnosis Date  . Hypertension   . Renal disorder   . TIA (transient ischemic attack)   . Arthritis   . Blood transfusion   . Aneurysm     brain  . DVT (deep venous thrombosis)    Past Surgical History  Procedure Laterality Date  . Cholecystectomy  08/02/2011    Procedure: LAPAROSCOPIC CHOLECYSTECTOMY WITH INTRAOPERATIVE CHOLANGIOGRAM;  Surgeon: Currie Parishristian J Streck, MD;  [facility];  Service: General;  Laterality: N/A;  . Abdominal hysterectomy     No family history on file. History  Substance Use Topics  . Smoking status: Never Smoker   . Smokeless tobacco: Not on file  . Alcohol Use: No   OB History   Grav Para Term Preterm  Abortions TAB SAB Ect Mult Living                 Review of Systems  Respiratory: Positive for shortness of breath. Negative for cough.   Cardiovascular: Positive for chest pain.  Gastrointestinal: Positive for abdominal pain.  Skin: Negative for rash.  All other systems reviewed and are negative.      Allergies  Review of patient's allergies indicates no known allergies.  Home Medications   Current Outpatient Rx  Name  Route  Sig  Dispense  Refill  . acetaminophen (TYLENOL) 325 MG tablet   Oral   Take 650 mg by mouth every 6 (six) hours as needed.         Marland Kitchen. amLODipine (NORVASC) 10 MG tablet   Oral   Take 10 mg by mouth 2 (two) times daily.         . benazepril (LOTENSIN) 40 MG tablet   Oral   Take 40 mg by mouth 2 (two) times daily.         . calcium acetate (PHOSLO) 667 MG capsule   Oral   Take 667 mg by mouth 3 (three) times daily with meals.         . cloNIDine (CATAPRES) 0.1 MG tablet   Oral   Take 0.2 mg by mouth 2 (two) times daily.         Marland Kitchen. HYDROcodone-acetaminophen (VICODIN) 5-500 MG per tablet   Oral   Take 0.5 tablets by mouth daily as needed. For pain   30 tablet   0   . labetalol (NORMODYNE)  200 MG tablet   Oral   Take 1 tablet (200 mg total) by mouth 2 (two) times daily.   60 tablet   1   . multivitamin (RENA-VIT) TABS tablet   Oral   Take 1 tablet by mouth at bedtime.   30 each   0    SpO2 96% Physical Exam  Nursing note and vitals reviewed. Constitutional: She appears well-developed and well-nourished. She appears distressed (a thin-appearing female appears to be uncomfortable).  HENT:  Head: Atraumatic.  Mouth/Throat: Oropharynx is clear and moist.  Eyes: Conjunctivae are normal.  Neck: Neck supple. No JVD present.  Cardiovascular: Normal rate, regular rhythm, normal heart sounds and intact distal pulses.   Pulmonary/Chest: Effort normal and breath sounds normal. She has no wheezes. She has no rales. She exhibits no  tenderness.  Abdominal: Soft. There is tenderness (Mild generalized abdominal tenderness without guarding or rebound tenderness.). There is no rebound and no guarding.  Musculoskeletal: She exhibits no edema.  Neurological: She is alert.  Skin: No rash noted.  Psychiatric: She has a normal mood and affect.    ED Course  Procedures (including critical care time)  2:03 PM Patient presents with persistent chest pain since yesterday. She is found to be hypertensive with blood pressure 240 systolic. Her EKG shows inferior ST wave changes which is new.  I worries for hypertensive emergency.  ASA and SL nitro given.  Will closely monitor.  Care discussed with Dr. Radford Pax.    2:16 PM Presents a potassium of 6.5, did not finish her dialysis yesterday which may account for elevated potassium level. No peak T-wave on EKG. We'll check a BMP. She has an elevated troponin of 0.54.  Although she has renal disease she has had and negative troponin the past. Considering patient has EKG changes with elevated troponin will consult cardiology for an NSTEMI. Nitro drip initiated.  3:12 PM I have consult with cardiologist who agrees to see patient in the ER and will admitted for further management.  Pt has hx of labile BP, taking labetalol at home.    3:41 PM I have consulted with nephrologist, Dr. Arta Silence, who will see pt in ER and will manage her renal condition.    Pt also has hx of PE/DVT, and may need study to r/o PE while inpt.  Her BP improves from 240s systolic to 200s systolic with nitro drip.    4:11 PM i have also consulted Critical Care Specialist, who agree to see and admits pt.    CRITICAL CARE Performed by: Fayrene Helper Total critical care time: 40 min Critical care time was exclusive of separately billable procedures and treating other patients. Critical care was necessary to treat or prevent imminent or life-threatening deterioration. Critical care was time spent personally by me on the  following activities: development of treatment plan with patient and/or surrogate as well as nursing, discussions with consultants, evaluation of patient's response to treatment, examination of patient, obtaining history from patient or surrogate, ordering and performing treatments and interventions, ordering and review of laboratory studies, ordering and review of radiographic studies, pulse oximetry and re-evaluation of patient's condition.   Labs Review Labs Reviewed  CBC WITH DIFFERENTIAL - Abnormal; Notable for the following:    Neutrophils Relative % 78 (*)    All other components within normal limits  I-STAT CHEM 8, ED - Abnormal; Notable for the following:    Sodium 136 (*)    Potassium 6.5 (*)    BUN 68 (*)  Creatinine, Ser 7.50 (*)    Glucose, Bld 125 (*)    Calcium, Ion 1.09 (*)    All other components within normal limits  I-STAT TROPOININ, ED - Abnormal; Notable for the following:    Troponin i, poc 0.54 (*)    All other components within normal limits  URINALYSIS, ROUTINE W REFLEX MICROSCOPIC  BASIC METABOLIC PANEL  TROPONIN I  HEPATIC FUNCTION PANEL  LIPASE, BLOOD  PRO B NATRIURETIC PEPTIDE  I-STAT CG4 LACTIC ACID, ED   Imaging Review Dg Chest Portable 1 View  08/17/2013   CLINICAL DATA:  Abdominal pain, increase in blood pressure  EXAM: PORTABLE CHEST - 1 VIEW  COMPARISON:  06/29/2013  FINDINGS: Cardiomegaly again noted. Atherosclerotic calcifications of thoracic aorta. Slight worsening interstitial prominence bilaterally. Mild edema or pneumonitis cannot be excluded. No segmental infiltrate.  IMPRESSION: Slight worsening interstitial prominence bilaterally. Mild edema or pneumonitis cannot be excluded. No segmental infiltrate. Follow-up examination is recommended.   Electronically Signed   By: Natasha Mead M.D.   On: 08/17/2013 15:11     EKG Interpretation   Date/Time:  Saturday August 17 2013 13:43:59 EST Ventricular Rate:  106 PR Interval:  168 QRS Duration:  95 QT Interval:  378 QTC Calculation: 502 R Axis:   -11 Text Interpretation:  Sinus tachycardia T wave inversion in the inferior  leads which are new. Prolonged QT interval abnormal EKG Confirmed by  BEATON  MD, ROBERT ([id number]) on 08/17/2013 1:50:40 PM      MDM   Final diagnoses:  NSTEMI (non-ST elevated myocardial infarction)  Hypertensive emergency  Dialysis patient  Hyperkalemia   BP 203/85  Pulse 108  Temp(Src) 98.9 F (37.2 C) (Oral)  Resp 19  SpO2 99%  I have reviewed nursing notes and vital signs. I personally reviewed the imaging tests through PACS system  I reviewed available ER/hospitalization records thought the EMR     Fayrene Helper, [place] 08/17/13 1611

## 2013-08-17 NOTE — Consult Note (Signed)
Renal Service Consult Note [facility]  Judy Wolf 08/17/2013 Judy Wolf Requesting Physician:  Dr Radford Pax, [facility] ED  Reason for Consult:  ESRD pt with N/V and abd pain , has high K 6.5 HPI: The patient is a 80 y.o. year-old with hx of TIA's, ASCVD, hx CVA 2011, DVT/PE in 2010 and ESRD on hemodialysis presenting with abd pain, N/V for about 2 days.  No hematemesis or SOB.  BP 241/95 on presentation. ON IV ntg gtt now.   Labs in ED showed K+ 6.5, BUN 68, Na 136, wbc 8.9 , Hv 12.8 and plt 375.   CXR shows new IS edema on background of baseline abnl CXR (chronic perihilar prominence/congestion appearance)  ROS  no cp  no nsaid's  no confusion  mild memory deficit  no prod cough or fever  no diarrhea  no jt pain  Chart Review  2010  > DVT/PE 2010 > Recurrent TIA w aphasia symptoms, labile BP at HD, HTN, mild dementia, ESRD on HD.  MRA showed mod severe ASCVD diffusely, MRI neg for CVA acute.  ECHO normal LVEF 60%, US neck no carotid dz.    2011 > GIB, MW tear, colon polyps removed, pulm nodule , hx LBP and spinal stenosis 11/2009 > syncope, acute L parietal stroke/CVA; asa chg'Wolf to plavix, MRA widespread disease of small-med sized vessels 07/2011 > abd pain, cholecystitis, lap chole 08/03/11, no complications Jan 2015 > syncope during HD, echo diast dysfxn type 1, low grade temp, contam Ucx , normal CXR, ESRD  Past Medical History  Past Medical History  Diagnosis Date  . Hypertension   . Renal disorder   . TIA (transient ischemic attack)   . Arthritis   . Blood transfusion   . Aneurysm     brain  . DVT (deep venous thrombosis)    Past Surgical History  Past Surgical History  Procedure Laterality Date  . Cholecystectomy  08/02/2011    Procedure: LAPAROSCOPIC CHOLECYSTECTOMY WITH INTRAOPERATIVE CHOLANGIOGRAM;  Surgeon: Currie Paris, MD;  Location: [facility] OR;  Service: General;  Laterality: N/A;  . Abdominal hysterectomy     Family History No family  history on file. Social History  reports that she has never smoked. She does not have any smokeless tobacco history on file. She reports that she does not drink alcohol or use illicit drugs. Allergies No Known Allergies Home medications Prior to Admission medications   Medication Sig Start Date End Date Taking? Authorizing Provider  acetaminophen (TYLENOL) 325 MG tablet Take 650 mg by mouth every 6 (six) hours as needed.    Historical Provider, MD  amLODipine (NORVASC) 10 MG tablet Take 10 mg by mouth 2 (two) times daily.    Historical Provider, MD  benazepril (LOTENSIN) 40 MG tablet Take 40 mg by mouth 2 (two) times daily.    Historical Provider, MD  calcium acetate (PHOSLO) 667 MG capsule Take 667 mg by mouth 3 (three) times daily with meals.    Historical Provider, MD  cloNIDine (CATAPRES) 0.1 MG tablet Take 0.2 mg by mouth 2 (two) times daily.    Historical Provider, MD  HYDROcodone-acetaminophen (VICODIN) 5-500 MG per tablet Take 0.5 tablets by mouth daily as needed. For pain 07/02/13   Stephani Police, PA-C  labetalol (NORMODYNE) 200 MG tablet Take 1 tablet (200 mg total) by mouth 2 (two) times daily. 07/02/13   Stephani Police, PA-C  multivitamin (RENA-VIT) TABS tablet Take 1 tablet by mouth at bedtime. 11/25/12  Calvert CantorSaima Rizwan, MD   Liver Function Tests No results found for this basename: AST, ALT, ALKPHOS, BILITOT, PROT, ALBUMIN,  in the last 168 hours No results found for this basename: LIPASE, AMYLASE,  in the last 168 hours CBC  Recent Labs Lab 08/17/13 1348 08/17/13 1409  WBC 8.9  --   NEUTROABS 6.9  --   HGB 12.8 14.3  HCT 38.9 42.0  MCV 91.7  --   PLT 375  --    Basic Metabolic Panel  Recent Labs Lab 08/17/13 1409  NA 136*  K 6.5*  CL 97  GLUCOSE 125*  BUN 68*  CREATININE 7.50*    Filed Vitals:   08/17/13 1440 08/17/13 1445 08/17/13 1500 08/17/13 1520  BP: 229/89 230/89 217/90 201/77  Pulse: 103 104 103 104  Temp:      TempSrc:      Resp: 20 19 20 27    SpO2: 99% 98% 98% 99%   Exam: Alert, elderly AAF, somewhat frail, no distress No rash, cyanosis or gangrene Sclera anicteric, throat clear No jvd, ? L carotid bruit Chest w bibasilar rales R>L RRR 2/6 SEM no RG Abd soft, mild mid abd tenderness, no ascites 1+ pitting edema pretib bilat Neuro is nf, ox3 LUA AVF patent, soft  CXR- new IS pulm edema pattern on background of chronic vasc congestion appearance Last ECHO > LV 60%, EV normal  Dialysis: MWF [place] 4h  F160  54kg  2K/2.25 [place]  15ga  Prof 4  Heparin 1500  LUA AVF  Hectorol 1     Epo 1000 on Mon & Fri   Assessment: 1 Hyperkalemia- missed HD last week due to weather 2 Pulm edem / vol excess 3 ESRD on hemodialysis 4 Hx of TIA / CVA - diffuse dz of small-med sized vessels by MRA 2011 5 HTN urgency- on IV NTG; home meds are norvasc, lotensin and clonidine 6 Abd pain / N /V- unclear cause 7 Anemia Hb 12, hold esa 8 2HPT- cont phoslo/Wolf 9 Mild dementia 10 Hx DVT/PE in 2010   Plan- HD upstairs today asap, see orders   Vinson Moselleob Sidnee Gambrill MD (pgr) [phone]    ([phone]) [phone] 08/17/2013, 4:00 PM

## 2013-08-17 NOTE — ED Notes (Signed)
Admitting at bedside 

## 2013-08-17 NOTE — ED Notes (Signed)
[facility]. M/W/F dialysis- completed dialysis yesterday. Has had Nausea, vomiting and LLQ abdominal pain. Intermittent- associated with vomiting. X 2 days. No blood noted in vomit. Family enroute.

## 2013-08-18 ENCOUNTER — Inpatient Hospital Stay (HOSPITAL_COMMUNITY): Payer: Medicare Other

## 2013-08-18 DIAGNOSIS — I10 Essential (primary) hypertension: Secondary | ICD-10-CM

## 2013-08-18 DIAGNOSIS — N186 End stage renal disease: Secondary | ICD-10-CM

## 2013-08-18 DIAGNOSIS — I214 Non-ST elevation (NSTEMI) myocardial infarction: Secondary | ICD-10-CM

## 2013-08-18 LAB — CBC
HEMATOCRIT: 33.7 % — AB (ref 36.0–46.0)
HEMOGLOBIN: 11 g/dL — AB (ref 12.0–15.0)
MCH: 30.4 pg (ref 26.0–34.0)
MCHC: 32.6 g/dL (ref 30.0–36.0)
MCV: 93.1 fL (ref 78.0–100.0)
Platelets: 311 10*3/uL (ref 150–400)
RBC: 3.62 MIL/uL — ABNORMAL LOW (ref 3.87–5.11)
RDW: 14.9 % (ref 11.5–15.5)
WBC: 7.3 10*3/uL (ref 4.0–10.5)

## 2013-08-18 LAB — HEMOGLOBIN A1C
Hgb A1c MFr Bld: 5.5 % (ref <5.7)
MEAN PLASMA GLUCOSE: 111 mg/dL (ref <117)

## 2013-08-18 LAB — BASIC METABOLIC PANEL
BUN: 32 mg/dL — AB (ref 6–23)
CALCIUM: 9.6 mg/dL (ref 8.4–10.5)
CO2: 27 mEq/L (ref 19–32)
Chloride: 96 mEq/L (ref 96–112)
Creatinine, Ser: 5.55 mg/dL — ABNORMAL HIGH (ref 0.50–1.10)
GFR calc Af Amer: 7 mL/min — ABNORMAL LOW (ref >90)
GFR calc non Af Amer: 6 mL/min — ABNORMAL LOW (ref >90)
GLUCOSE: 86 mg/dL (ref 70–99)
Potassium: 5 mEq/L (ref 3.7–5.3)
Sodium: 141 mEq/L (ref 137–147)

## 2013-08-18 LAB — LIPID PANEL
Cholesterol: 239 mg/dL — ABNORMAL HIGH (ref 0–200)
HDL: 82 mg/dL (ref >39)
LDL Cholesterol: 143 mg/dL — ABNORMAL HIGH (ref 0–99)
Total CHOL/HDL Ratio: 2.9 RATIO
Triglycerides: 70 mg/dL (ref <150)
VLDL: 14 mg/dL (ref 0–40)

## 2013-08-18 LAB — TROPONIN I
TROPONIN I: 0.42 ng/mL — AB (ref <0.30)
Troponin I: 0.51 ng/mL (ref <0.30)

## 2013-08-18 LAB — TSH: TSH: 2.894 u[IU]/mL (ref 0.350–4.500)

## 2013-08-18 LAB — HEPATITIS B SURFACE ANTIGEN: Hepatitis B Surface Ag: NEGATIVE

## 2013-08-18 MED ORDER — ASPIRIN 81 MG PO CHEW
81.0000 mg | CHEWABLE_TABLET | Freq: Every day | ORAL | Status: DC
  Administered 2013-08-18 – 2013-08-21 (×3): 81 mg via ORAL
  Filled 2013-08-18 (×4): qty 1

## 2013-08-18 MED ORDER — CARVEDILOL 12.5 MG PO TABS
12.5000 mg | ORAL_TABLET | Freq: Two times a day (BID) | ORAL | Status: DC
  Administered 2013-08-18 – 2013-08-21 (×4): 12.5 mg via ORAL
  Filled 2013-08-18 (×8): qty 1

## 2013-08-18 NOTE — Progress Notes (Signed)
  [facility] Progress Note   Subjective: Feels much better and looks better. Only 2.1 kg off last night at HD, pt became agitated and HD was stopped 1 hour early.  No sob or cough today.  Filed Vitals:   08/18/13 1115 08/18/13 1200 08/18/13 1300 08/18/13 1601  BP: 137/51 153/42 166/52 133/68  Pulse: 70 65 66 69  Temp: 98.7 F (37.1 C)   98.5 F (36.9 C)  TempSrc: Oral   Oral  Resp: 17 12 10 18   Height:      Weight:      SpO2: 100% 99% 100% 100%   Exam: Alert, elderly AAF, somewhat frail, no distress  No rash, cyanosis or gangrene  Sclera anicteric, throat clear  No jvd, ? L carotid bruit  Chest w bibasilar rales R>L  RRR 2/6 SEM no RG  Abd soft, mild mid abd tenderness, no ascites  1+ pitting edema pretib bilat  Neuro is nf, ox3  LUA AVF patent, soft   CXR  2/28 > mild-mod pulm edema CXR 3/1 > improved pulm edema, residual vasc congestion Last ECHO > LV 60%, EV normal   Dialysis: MWF [place]  4h F160 54kg 2K/2.25 [place] 15ga Prof 4 Heparin 1500 LUA AVF  Hectorol 1 Epo 1000 on Mon & Fri   Assessment:  1 Pulm edem / vol excess- improved 2 Hyperkalemia- resolved 3 ESRD on hemodialysis  4 Hx of TIA / CVA - diffuse dz of small-med sized vessels by MRA 2011  5 HTN urgency- on IV NTG; home meds are norvasc, lotensin and clonidine  6 Abd pain / N /V- unclear cause, per primary 7 Anemia Hb 12, hold esa  8 2HPT- cont phoslo/D  9 Mild dementia  10 Hx DVT/PE in 2011  Plan- HD tomorrow, should be able to lower dry weight further    Vinson Moselleob Aasha Dina MD  pager [phone]    cell [phone]  08/18/2013, 6:12 PM     Recent Labs Lab 08/17/13 1409 08/17/13 1525 08/17/13 2239 08/18/13 0650  NA 136* 141  --  141  K 6.5* 5.8* 3.5* 5.0  CL 97 91*  --  96  CO2  --  28  --  27  GLUCOSE 125* 125*  --  86  BUN 68* 48*  --  32*  CREATININE 7.50* 7.44*  --  5.55*  CALCIUM  --  10.2  --  9.6    Recent Labs Lab 08/17/13 1525  AST 39*  ALT 24  ALKPHOS 55   BILITOT 0.4  PROT 7.4  ALBUMIN 3.6    Recent Labs Lab 08/17/13 1348 08/17/13 1409 08/18/13 0650  WBC 8.9  --  7.3  NEUTROABS 6.9  --   --   HGB 12.8 14.3 11.0*  HCT 38.9 42.0 33.7*  MCV 91.7  --  93.1  PLT 375  --  311   . amLODipine  10 mg Oral QHS  . aspirin  81 mg Oral Daily  . atorvastatin  20 mg Oral q1800  . benazepril  40 mg Oral BID  . carvedilol  12.5 mg Oral BID WC  . cloNIDine  0.2 mg Oral BID  . [START ON 08/19/2013] doxercalciferol  1 mcg Intravenous Q M,W,F-HD  . heparin  5,000 Units Subcutaneous 3 times per day     sodium chloride, acetaminophen, hydrALAZINE, HYDROcodone-acetaminophen, labetalol, nitroGLYCERIN, ondansetron (ZOFRAN) IV

## 2013-08-18 NOTE — Progress Notes (Signed)
Name: Judy Wolf MRN: [id number] DOB: 12/06/1928    ADMISSION DATE:  08/17/2013 CONSULTATION DATE:  08/18/2013  REFERRING MD :  EDP PRIMARY SERVICE: PCCM  CHIEF COMPLAINT:  Chest pain, high BP  BRIEF PATIENT DESCRIPTION: 80 y.o ESRD on HD X 6y (M/W/F) admitted with nausea, vomiting, sub sternal chest pain & hypertensive emergency with nstemi. Started on IV NTG & taken to emergent HD for hyperkalemia, BP 241/95 on arrival  PMhx of TIA's, ASCVD, hx CVA 2011, DVT/PE in 2010   SIGNIFICANT EVENTS / STUDIES:  2/27 HD x 2h only- stopped due to nausea/Vomiting  LINES / TUBES: LUE AVf  CULTURES:   ANTIBIOTICS:   HISTORY OF PRESENT ILLNESS:  80 y.o ESRD on HD X 6y (M/W/F) admitted with nausea, vomiting, sub sternal chest pain & hypertensive emergency with nstemi - EKG -? Strain pattern with LVH vs lateral wall ischemia, Tp 0.54. Received 4 mg morphine with pain relief, Did not complete HD on Friday due to vomiting, Labs showed K 6.5 --> 5.8, no hyperkalemic T waves CXR showed interstitial edema She has difficult to control htn requiring 4 meds, recent adm jan 2015 for syncope during HD noted  PMhx of TIA's, ASCVD, hx CVA 2011, DVT/PE in 2010  SUBJECTIVE:  No chest pain, reports tingling in legs  VITAL SIGNS: Temp:  [98.2 F (36.8 C)-98.9 F (37.2 C)] 98.4 F (36.9 C) (03/01 0745) Pulse Rate:  [76-112] 85 (03/01 0846) Resp:  [6-90] 16 (03/01 0846) BP: (76-253)/(36-105) 121/52 mmHg (03/01 0800) SpO2:  [88 %-100 %] 93 % (03/01 0846) Weight:  [52.2 kg (115 lb 1.3 oz)-54.7 kg (120 lb 9.5 oz)] 52.6 kg (115 lb 15.4 oz) (03/01 0400) HEMODYNAMICS:   VENTILATOR SETTINGS:   INTAKE / OUTPUT: Intake/Output     02/28 0701 - 03/01 0700 03/01 0701 - 03/02 0700   I.V. (mL/kg) 83.9 (1.6) 17.5 (0.3)   Total Intake(mL/kg) 83.9 (1.6) 17.5 (0.3)   Other 2115    Total Output 2115     Net -2031.1 +17.5          PHYSICAL EXAMINATION: Gen. Pleasant, well-nourished, in no distress,  normal affect ENT - no lesions Neck: No JVD, no thyromegaly, Lungs: no use of accessory muscles, decreased bs bases Cardiovascular: Rhythm regular, heart sounds  normal, ESM 2/6 at base, no peripheral edema Abdomen: soft and non-tender,  BS normal. Musculoskeletal: No deformities, no cyanosis or clubbing, LUE AVF - good thrill Neuro:  alert, non focal Skin:  Warm, no lesions/ rash   LABS:  CBC  Recent Labs Lab 08/17/13 1348 08/17/13 1409 08/18/13 0650  WBC 8.9  --  7.3  HGB 12.8 14.3 11.0*  HCT 38.9 42.0 33.7*  PLT 375  --  311   Coag's No results found for this basename: APTT, INR,  in the last 168 hours BMET  Recent Labs Lab 08/17/13 1409 08/17/13 1525 08/17/13 2239 08/18/13 0650  NA 136* 141  --  141  K 6.5* 5.8* 3.5* 5.0  CL 97 91*  --  96  CO2  --  28  --  27  BUN 68* 48*  --  32*  CREATININE 7.50* 7.44*  --  5.55*  GLUCOSE 125* 125*  --  86   Electrolytes  Recent Labs Lab 08/17/13 1525 08/18/13 0650  CALCIUM 10.2 9.6   Sepsis Markers  Recent Labs Lab 08/17/13 1409  LATICACIDVEN 1.92   ABG No results found for this basename: PHART, PCO2ART, PO2ART,  in  the last 168 hours Liver Enzymes  Recent Labs Lab 08/17/13 1525  AST 39*  ALT 24  ALKPHOS 55  BILITOT 0.4  ALBUMIN 3.6   Cardiac Enzymes  Recent Labs Lab 08/17/13 1850 08/18/13 0155 08/18/13 0650  TROPONINI 0.65* 0.51* 0.42*  PROBNP >70000.0*  --   --    Glucose No results found for this basename: GLUCAP,  in the last 168 hours  Imaging Dg Chest Portable 1 View  08/17/2013   CLINICAL DATA:  Abdominal pain, increase in blood pressure  EXAM: PORTABLE CHEST - 1 VIEW  COMPARISON:  06/29/2013  FINDINGS: Cardiomegaly again noted. Atherosclerotic calcifications of thoracic aorta. Slight worsening interstitial prominence bilaterally. Mild edema or pneumonitis cannot be excluded. No segmental infiltrate.  IMPRESSION: Slight worsening interstitial prominence bilaterally. Mild edema or  pneumonitis cannot be excluded. No segmental infiltrate. Follow-up examination is recommended.   Electronically Signed   By: Natasha Mead M.D.   On: 08/17/2013 15:11     CXR: mild edema 2/28  ASSESSMENT / PLAN:  PULMONARY A:Interstitial edema P:   HD  CARDIOVASCULAR A: Hypertensive crisis CAD, NSTEMI doubt dissection P:  IV NTG off   Resume home meds - amlodipin, benaepril, clonidine , labetalol (she has not filled this Rx) Use labetalol & hydralazine prn ASA, cards input noted  RENAL A:  ESRD Hyperkalemia P:   Emergent HD  GASTROINTESTINAL A:  N/V ? Gastritis vs related to cardiac ischemia P:   Lipase ok  HEMATOLOGIC A:  No issues P:    INFECTIOUS A:  No issues P:     ENDOCRINE A:  No issues   P:     NEUROLOGIC A:  H/o TIA, ? dementia P:   ASA  TODAY'S SUMMARY Awake and alert. SBP 160(dc ntg), trop I decreasing. transfer to triad team.    Brett Canales Minor ACNP [facility] PCCM Pager [phone] till 3 pm If no answer page [phone]  Transfer to tele   [facility].  08/18/2013, 9:40 AM

## 2013-08-18 NOTE — Progress Notes (Signed)
During hemodialysis Tx pt sat up in bed and starting Yelling "Let me out" and prying to the lord that we were killing her. Not answering when asked to indicate what the problem is. BP dropping and NTG gtt is off. Not able to get pt to cooperate and sit still in her bed. Dr. Arlean HoppingSchertz called and informed of this. Hemodialysis Tx stopped 1 hour early for pt safety due to pt not keeping still in the bed.

## 2013-08-18 NOTE — ED Provider Notes (Signed)
Medical screening examination/treatment/procedure(s) were performed by non-physician practitioner and as supervising physician I was immediately available for consultation/collaboration.    Adlean Hardeman L Gunda Maqueda, MD 08/18/13 0817 

## 2013-08-18 NOTE — Progress Notes (Signed)
Patient ID: Judy Wolf, female   DOB: May 08, 1929, 80 y.o.   MRN: [id number]   SUBJECTIVE: No chest pain.  BP better controlled.  Patient feels "bad" this morning.  Hard to get much detail but says that her legs ache.   Scheduled Meds: . amLODipine  10 mg Oral QHS  . atorvastatin  20 mg Oral q1800  . benazepril  40 mg Oral BID  . carvedilol  12.5 mg Oral BID WC  . cloNIDine  0.2 mg Oral BID  . [START ON 08/19/2013] doxercalciferol  1 mcg Intravenous Q M,W,F-HD  . heparin  5,000 Units Subcutaneous 3 times per day  . pantoprazole (PROTONIX) IV  40 mg Intravenous QHS   Continuous Infusions:  PRN Meds:.sodium chloride, acetaminophen, hydrALAZINE, HYDROcodone-acetaminophen, labetalol, nitroGLYCERIN, ondansetron (ZOFRAN) IV    Filed Vitals:   08/18/13 0700 08/18/13 0745 08/18/13 0800 08/18/13 0846  BP: 158/51  121/52   Pulse: 79  77 85  Temp:  98.4 F (36.9 C)    TempSrc:  Oral    Resp: 15  18 16   Height:      Weight:      SpO2: 100%  92% 93%    Intake/Output Summary (Last 24 hours) at 08/18/13 1026 Last data filed at 08/18/13 0800  Gross per 24 hour  Intake 101.41 ml  Output   2115 ml  Net -2013.59 ml    LABS: Basic Metabolic Panel:  Recent Labs  40/98/1102/28/15 1525 08/17/13 2239 08/18/13 0650  NA 141  --  141  K 5.8* 3.5* 5.0  CL 91*  --  96  CO2 28  --  27  GLUCOSE 125*  --  86  BUN 48*  --  32*  CREATININE 7.44*  --  5.55*  CALCIUM 10.2  --  9.6   Liver Function Tests:  Recent Labs  08/17/13 1525  AST 39*  ALT 24  ALKPHOS 55  BILITOT 0.4  PROT 7.4  ALBUMIN 3.6    Recent Labs  08/17/13 1525  LIPASE 33   CBC:  Recent Labs  08/17/13 1348 08/17/13 1409 08/18/13 0650  WBC 8.9  --  7.3  NEUTROABS 6.9  --   --   HGB 12.8 14.3 11.0*  HCT 38.9 42.0 33.7*  MCV 91.7  --  93.1  PLT 375  --  311   Cardiac Enzymes:  Recent Labs  08/17/13 1850 08/17/13 2239 08/18/13 0155 08/18/13 0650  CKTOTAL  --  92  --   --   TROPONINI 0.65*  --  0.51*  0.42*   BNP: No components found with this basename: POCBNP,  D-Dimer: No results found for this basename: DDIMER,  in the last 72 hours Hemoglobin A1C: No results found for this basename: HGBA1C,  in the last 72 hours Fasting Lipid Panel:  Recent Labs  08/18/13 0650  CHOL 239*  HDL 82  LDLCALC 143*  TRIG 70  CHOLHDL 2.9   Thyroid Function Tests: No results found for this basename: TSH, T4TOTAL, FREET3, T3FREE, THYROIDAB,  in the last 72 hours Anemia Panel: No results found for this basename: VITAMINB12, FOLATE, FERRITIN, TIBC, IRON, RETICCTPCT,  in the last 72 hours  RADIOLOGY: Dg Chest Portable 1 View  08/17/2013   CLINICAL DATA:  Abdominal pain, increase in blood pressure  EXAM: PORTABLE CHEST - 1 VIEW  COMPARISON:  06/29/2013  FINDINGS: Cardiomegaly again noted. Atherosclerotic calcifications of thoracic aorta. Slight worsening interstitial prominence bilaterally. Mild edema or pneumonitis cannot be excluded. No  segmental infiltrate.  IMPRESSION: Slight worsening interstitial prominence bilaterally. Mild edema or pneumonitis cannot be excluded. No segmental infiltrate. Follow-up examination is recommended.   Electronically Signed   By: Natasha Mead M.D.   On: 08/17/2013 15:11    PHYSICAL EXAM General: NAD Neck: No JVD, no thyromegaly or thyroid nodule.  Lungs: Clear to auscultation bilaterally with normal respiratory effort. CV: Nondisplaced PMI.  Heart regular S1/S2, no S3/S4, 1/6 SEM RUSB.  No peripheral edema.  No carotid bruit.  Normal pedal pulses.  Abdomen: Soft, nontender, no hepatosplenomegaly, no distention.  Neurologic: Alert, some confusion.  Extremities: No clubbing or cyanosis.   TELEMETRY: Reviewed telemetry pt in NSR  ASSESSMENT AND PLAN: 80 yo with history of ESRD, HTN, prior CVA, prior PE presented with chest pain in the setting of hypertensive emergency and possible pulmonary edema.  She had missed an HD session last week due to weather.  She had emergent  HD last night.  BP is much better controlled today.  TnI was mildly elevated.  1. Elevated troponin: Peaked at 0.65 now trending down.  Suspect demand ischemia in setting of hypertensive emergency/pulm edema.  No further chest pain.  Recent echo with normal EF.  She has diffuse deep T wave inversions with LVH on ECG.  However, prior ECG showed shallower TWIs basically in the same distribution, wonder if this is worsening strain pattern.  She was not started on heparin gtt. At this point, would treat with aspirin and statin.  If she has no further chest pain, can arrange for outpatient Cardiolite.  2. Hypertensive emergency: BP much better after HD and on current med regimen.  Would continue.  Can use 12.5 mg bid Coreg.   Marca Ancona 08/18/2013 10:35 AM

## 2013-08-18 NOTE — Progress Notes (Signed)
Report received from Shearon BaloNicole Beasley, Charity fundraiserN. Awaiting pt

## 2013-08-18 NOTE — Progress Notes (Signed)
Attempted to call report on pt twice at 1425 & 1430, no answer at nurse station; will continue to keep trying to call report

## 2013-08-19 LAB — CBC
HCT: 31.4 % — ABNORMAL LOW (ref 36.0–46.0)
HEMATOCRIT: 30 % — AB (ref 36.0–46.0)
HEMOGLOBIN: 9.9 g/dL — AB (ref 12.0–15.0)
Hemoglobin: 10.4 g/dL — ABNORMAL LOW (ref 12.0–15.0)
MCH: 30.6 pg (ref 26.0–34.0)
MCH: 30.3 pg (ref 26.0–34.0)
MCHC: 33 g/dL (ref 30.0–36.0)
MCHC: 33.1 g/dL (ref 30.0–36.0)
MCV: 92.6 fL (ref 78.0–100.0)
MCV: 91.5 fL (ref 78.0–100.0)
Platelets: 305 10*3/uL (ref 150–400)
Platelets: 321 10*3/uL (ref 150–400)
RBC: 3.24 MIL/uL — ABNORMAL LOW (ref 3.87–5.11)
RBC: 3.43 MIL/uL — ABNORMAL LOW (ref 3.87–5.11)
RDW: 14.6 % (ref 11.5–15.5)
RDW: 14.7 % (ref 11.5–15.5)
WBC: 6.8 10*3/uL (ref 4.0–10.5)
WBC: 8.4 10*3/uL (ref 4.0–10.5)

## 2013-08-19 LAB — PHOSPHORUS: PHOSPHORUS: 6.6 mg/dL — AB (ref 2.3–4.6)

## 2013-08-19 LAB — RENAL FUNCTION PANEL
Albumin: 3.2 g/dL — ABNORMAL LOW (ref 3.5–5.2)
BUN: 63 mg/dL — ABNORMAL HIGH (ref 6–23)
CO2: 25 mEq/L (ref 19–32)
Calcium: 9.2 mg/dL (ref 8.4–10.5)
Chloride: 94 mEq/L — ABNORMAL LOW (ref 96–112)
Creatinine, Ser: 8.07 mg/dL — ABNORMAL HIGH (ref 0.50–1.10)
GFR calc Af Amer: 5 mL/min — ABNORMAL LOW (ref >90)
GFR calc non Af Amer: 4 mL/min — ABNORMAL LOW (ref >90)
Glucose, Bld: 110 mg/dL — ABNORMAL HIGH (ref 70–99)
Phosphorus: 6.1 mg/dL — ABNORMAL HIGH (ref 2.3–4.6)
Potassium: 4.9 mEq/L (ref 3.7–5.3)
Sodium: 138 mEq/L (ref 137–147)

## 2013-08-19 LAB — BASIC METABOLIC PANEL
BUN: 57 mg/dL — ABNORMAL HIGH (ref 6–23)
CHLORIDE: 94 meq/L — AB (ref 96–112)
CO2: 29 mEq/L (ref 19–32)
Calcium: 9.3 mg/dL (ref 8.4–10.5)
Creatinine, Ser: 7.38 mg/dL — ABNORMAL HIGH (ref 0.50–1.10)
GFR calc Af Amer: 5 mL/min — ABNORMAL LOW (ref >90)
GFR, EST NON AFRICAN AMERICAN: 5 mL/min — AB (ref >90)
Glucose, Bld: 94 mg/dL (ref 70–99)
Potassium: 4.9 mEq/L (ref 3.7–5.3)
Sodium: 137 mEq/L (ref 137–147)

## 2013-08-19 LAB — MAGNESIUM: MAGNESIUM: 2.3 mg/dL (ref 1.5–2.5)

## 2013-08-19 MED ORDER — DARBEPOETIN ALFA-POLYSORBATE 60 MCG/0.3ML IJ SOLN
60.0000 ug | INTRAMUSCULAR | Status: DC
  Administered 2013-08-19: 60 ug via INTRAVENOUS
  Filled 2013-08-19: qty 0.3

## 2013-08-19 MED ORDER — DARBEPOETIN ALFA-POLYSORBATE 60 MCG/0.3ML IJ SOLN
INTRAMUSCULAR | Status: AC
  Filled 2013-08-19: qty 0.3

## 2013-08-19 MED ORDER — NEPRO/CARBSTEADY PO LIQD
237.0000 mL | Freq: Every day | ORAL | Status: DC
  Administered 2013-08-20: 237 mL via ORAL
  Filled 2013-08-19 (×4): qty 237

## 2013-08-19 MED ORDER — DOXERCALCIFEROL 4 MCG/2ML IV SOLN
INTRAVENOUS | Status: AC
  Filled 2013-08-19: qty 2

## 2013-08-19 MED ORDER — CALCIUM ACETATE 667 MG PO CAPS
1334.0000 mg | ORAL_CAPSULE | Freq: Three times a day (TID) | ORAL | Status: DC
  Administered 2013-08-20 – 2013-08-21 (×4): 1334 mg via ORAL
  Filled 2013-08-19 (×9): qty 2

## 2013-08-19 NOTE — Progress Notes (Addendum)
Patient daughter Inetta Fermo(Tina) request attending MD to call and update her on the patients plan of care and has concerns about patient going back home and being safe. Daughter states patients stays alone in an apartment. Daughter states she is unable to come to the hospital because she is sick.  Will notify MD. Daughter's contact information listed in chart.  Stanton KidneyCURRY, Traevon Meiring R

## 2013-08-19 NOTE — Progress Notes (Signed)
INITIAL NUTRITION ASSESSMENT  DOCUMENTATION CODES Per approved criteria  -Severe malnutrition in the context of chronic illness   INTERVENTION: Nepro Shake po daily, each supplement provides 425 kcal and 19 grams protein RD to follow for nutrition care plan  NUTRITION DIAGNOSIS: Increased nutrient needs related to ESRD on HD as evidenced by estimated nutrition needs  Goal: Pt to meet >/= 90% of their estimated nutrition needs   Monitor:  PO & supplemental intake, weight, labs, I/O's  Reason for Assessment: Malnutrition Screening Tool Report  80 y.o. female  Admitting Dx: chest pain, high blood pressure  ASSESSMENT: 80 y.o ESRD on HD admitted with nausea, vomiting, sub sternal chest pain & hypertensive emergency with NSTEMI; Started on IV NTG & taken to emergent HD for hyperkalemia, BP 241/95 on arrival.  Patient reports her appetite is good; PO intake variable at 50-75% per flowsheet records; pt was hospitalized beginning of January 2015 for syncopal event that occurred during HD treatment -- workup revealed fever & metabolic alkalosis; per weight readings, patient has lost ~ 12 lbs of dry weight (10% -- severe for time frame); + moderate-severe muscle & subcutaneous fat loss to upper body; would benefit from addition of nutrition supplement; states she's never tried Nepro Carb Steady; will order daily.  Nutrition Focused Physical Exam:  Subcutaneous Fat:  Orbital Region: WNL Upper Arm Region: severe depletion Thoracic and Lumbar Region: N/A  Muscle:  Temple Region: moderate depletion Clavicle Bone Region: severe depletion Clavicle and Acromion Bone Region: severe depletion Scapular Bone Region: N/A Dorsal Hand: N/A Patellar Region: N/A Anterior Thigh Region: moderate depletion Posterior Calf Region: moderate depletion  Edema: none  Patient meets criteria for severe malnutrition in the context of chronic illness as evidenced by 10% weight loss x 2 months and  moderate-severe muscle & subcutaneous fat loss.  Height: Ht Readings from Last 1 Encounters:  08/18/13 5' (1.524 m)    Weight: Wt Readings from Last 1 Encounters:  08/19/13 107 lb 2.3 oz (48.6 kg)    08/19/13 -- Pre-HD weight: 112 lbs  Post-HD weight: 107 lbs 07/01/13 -- Pre-HD weight: 122 lbs Post-HD weight: 119 lbs  Ideal Body Weight: 118 lb  % Ideal Body Weight: 91%  Wt Readings from Last 10 Encounters:  08/19/13 107 lb 2.3 oz (48.6 kg)  07/01/13 119 lb 0.8 oz (54 kg)  11/24/12 121 lb 0.5 oz (54.9 kg)  08/16/11 129 lb (58.514 kg)  08/07/11 129 lb (58.514 kg)  08/05/11 123 lb 5.6 oz (55.95 kg)  08/05/11 123 lb 5.6 oz (55.95 kg)    Usual Body Weight: 119 lb -- January 2015  % Usual Body Weight: 90%  BMI:  Body mass index is 20.93 kg/(m^2).  Estimated Nutritional Needs: Kcal: 1500-1700 Protein: 70-80 gm Fluid: 1200 ml  Skin: Intact  Diet Order: Renal W/1200 ml fluid restriction  EDUCATION NEEDS: -No education needs identified at this time   Intake/Output Summary (Last 24 hours) at 08/19/13 1535 Last data filed at 08/19/13 1441  Gross per 24 hour  Intake    480 ml  Output   2600 ml  Net  -2120 ml     Labs:   Recent Labs Lab 08/18/13 0650 08/19/13 0355 08/19/13 1048  NA 141 137 138  K 5.0 4.9 4.9  CL 96 94* 94*  CO2 27 29 25   BUN 32* 57* 63*  CREATININE 5.55* 7.38* 8.07*  CALCIUM 9.6 9.3 9.2  MG  --  2.3  --   PHOS  --  6.6* 6.1*  GLUCOSE 86 94 110*    Scheduled Meds: . amLODipine  10 mg Oral QHS  . aspirin  81 mg Oral Daily  . atorvastatin  20 mg Oral q1800  . benazepril  40 mg Oral BID  . calcium acetate  1,334 mg Oral TID WC  . carvedilol  12.5 mg Oral BID WC  . cloNIDine  0.2 mg Oral BID  . darbepoetin (ARANESP) injection - DIALYSIS  60 mcg Intravenous Q Mon-HD  . doxercalciferol  1 mcg Intravenous Q M,W,F-HD  . heparin  5,000 Units Subcutaneous 3 times per day    Continuous Infusions:   Past Medical History  Diagnosis Date   . Hypertension   . Renal disorder   . TIA (transient ischemic attack)   . Arthritis   . Blood transfusion   . Aneurysm     brain  . DVT (deep venous thrombosis)     Past Surgical History  Procedure Laterality Date  . Cholecystectomy  08/02/2011    Procedure: LAPAROSCOPIC CHOLECYSTECTOMY WITH INTRAOPERATIVE CHOLANGIOGRAM;  Surgeon: Currie Paris, MD;  Location: [facility] OR;  Service: General;  Laterality: N/A;  . Abdominal hysterectomy      Maureen Chatters, RD, LDN Pager #: [phone] After-Hours Pager #: [phone]

## 2013-08-19 NOTE — Progress Notes (Signed)
  [facility] Progress Note   Subjective: No new complaints, some perseverating  Filed Vitals:   08/18/13 1601 08/18/13 1956 08/18/13 2233 08/19/13 0432  BP: 133/68 144/68 120/65 126/63  Pulse: 69 67  60  Temp: 98.5 F (36.9 C) 98.8 F (37.1 C)  98.7 F (37.1 C)  TempSrc: Oral Oral  Oral  Resp: 18 18  18   Height:      Weight:    50.2 kg (110 lb 10.7 oz)  SpO2: 100% 97%  98%   Exam: Alert, elderly AAF, somewhat frail, no distress  No rash, cyanosis or gangrene  Sclera anicteric, throat clear  No jvd, ? L carotid bruit  Chest w bibasilar rales R>L  RRR 2/6 SEM no RG  Abd soft, mild mid abd tenderness, no ascites  1+ pitting edema pretib bilat  Neuro is nf, ox3  LUA AVF patent, soft   CXR  2/28 > mild-mod pulm edema CXR 3/1 > improved pulm edema, residual vasc congestion Last ECHO > LV 60%, EV normal   Dialysis: MWF   4h F160 54kg 2K/2.25 [place] 15ga Prof 4 Heparin 1500 LUA AVF  Hectorol 1 Epo 1000 on Mon & Fri   Assessment:  1 Pulm edem / vol excess- improved 2 Hyperkalemia- resolved 3 ESRD on hemodialysis - MWF, due today via AVF 4 Hx of TIA / CVA - diffuse dz of small-med sized vessels by MRA 2011  5 HTN urgency- on home meds  norvasc 10, lotensin 40 and clonidine 0.2 BID and coreg 12.5 BID- very well controlled currently  6 Abd pain / N /V- unclear cause, per primary- seems better 7 Anemia Hb 12 but decreasing, resume esa  8 2HPT- cont phoslo/D  9 Mild dementia  10 Hx DVT/PE in 2011     Antwoin Lackey A   08/19/2013, 8:07 AM     Recent Labs Lab 08/17/13 1525 08/17/13 2239 08/18/13 0650 08/19/13 0355  NA 141  --  141 137  K 5.8* 3.5* 5.0 4.9  CL 91*  --  96 94*  CO2 28  --  27 29  GLUCOSE 125*  --  86 94  BUN 48*  --  32* 57*  CREATININE 7.44*  --  5.55* 7.38*  CALCIUM 10.2  --  9.6 9.3  PHOS  --   --   --  6.6*    Recent Labs Lab 08/17/13 1525  AST 39*  ALT 24  ALKPHOS 55  BILITOT 0.4  PROT 7.4  ALBUMIN 3.6     Recent Labs Lab 08/17/13 1348 08/17/13 1409 08/18/13 0650 08/19/13 0355  WBC 8.9  --  7.3 6.8  NEUTROABS 6.9  --   --   --   HGB 12.8 14.3 11.0* 9.9*  HCT 38.9 42.0 33.7* 30.0*  MCV 91.7  --  93.1 92.6  PLT 375  --  311 305   . amLODipine  10 mg Oral QHS  . aspirin  81 mg Oral Daily  . atorvastatin  20 mg Oral q1800  . benazepril  40 mg Oral BID  . carvedilol  12.5 mg Oral BID WC  . cloNIDine  0.2 mg Oral BID  . doxercalciferol  1 mcg Intravenous Q M,W,F-HD  . heparin  5,000 Units Subcutaneous 3 times per day     sodium chloride, acetaminophen, hydrALAZINE, HYDROcodone-acetaminophen, labetalol, nitroGLYCERIN, ondansetron (ZOFRAN) IV

## 2013-08-19 NOTE — Progress Notes (Signed)
Patient very agistated and increased confusion post dialysis. Patient refused to take medication and stated that she "wasn't in the hospital". Patient took the pills in her hand, but refused to take them or give them back to me. Tried to convince patient for over an hour to either take the pills or give them back. Charge nurse notified of situation and tried to persuade patient to take pill or give them back. Patient continued to refuse. Was able to get a couple of the pills out of the patient hands. However, this caused the patient to become more agitated. None of patient evening meds were able to be given because of this situation. Stanton KidneyCURRY, Di Jasmer R

## 2013-08-19 NOTE — Clinical Documentation Improvement (Signed)
  Patient with "Hypertensive emergency: evidence for end-organ dysfunction (chest pain and elevated troponin)", ESRD on HD and NSTEMI on admit. BP 244/89 placed on NTG drip. In the Coding world "hypertensive emergency or urgency" equates to benign hypertension. Please clarify "hypertensive emergency/urgency" to clearly show severity of illness and risk of mortality. Thank you.  Possible Clinical Conditions?  - Accelerated Hypertension - Malignant Hypertension - Other Condition (please specify)  Thank You, Beverley FiedlerLaurie E Kashis Penley ,RN Clinical Documentation Specialist:  [phone]  [facility]- Health Information Management

## 2013-08-19 NOTE — Progress Notes (Signed)
Subjective:  Eating big breakfast. No complaints. Feels better.   Objective:  Vital Signs in the last 24 hours: Temp:  [98.4 F (36.9 C)-98.8 F (37.1 C)] 98.7 F (37.1 C) (03/02 0432) Pulse Rate:  [60-85] 60 (03/02 0432) Resp:  [10-18] 18 (03/02 0432) BP: (120-166)/(42-68) 126/63 mmHg (03/02 0432) SpO2:  [92 %-100 %] 98 % (03/02 0432) Weight:  [110 lb 10.7 oz (50.2 kg)] 110 lb 10.7 oz (50.2 kg) (03/02 0432)  Intake/Output from previous day: 03/01 0701 - 03/02 0700 In: 302.5 [P.O.:240; I.V.:62.5] Out: -    Physical Exam: General: Well developed, elderly, in no acute distress. Head:  Normocephalic and atraumatic. Lungs: Clear to auscultation and percussion. Heart: Normal S1 and S2.  No murmur, rubs or gallops.  Abdomen: soft, non-tender, positive bowel sounds. Extremities: No clubbing or cyanosis. No edema. Neurologic: Alert and oriented x 3.    Lab Results:  Recent Labs  08/18/13 0650 08/19/13 0355  WBC 7.3 6.8  HGB 11.0* 9.9*  PLT 311 305    Recent Labs  08/18/13 0650 08/19/13 0355  NA 141 137  K 5.0 4.9  CL 96 94*  CO2 27 29  GLUCOSE 86 94  BUN 32* 57*  CREATININE 5.55* 7.38*    Recent Labs  08/18/13 0155 08/18/13 0650  TROPONINI 0.51* 0.42*   Hepatic Function Panel  Recent Labs  08/17/13 1525  PROT 7.4  ALBUMIN 3.6  AST 39*  ALT 24  ALKPHOS 55  BILITOT 0.4  BILIDIR <0.2  IBILI NOT CALCULATED    Recent Labs  08/18/13 0650  CHOL 239*   No results found for this basename: PROTIME,  in the last 72 hours  Imaging: Dg Chest Port 1 View  08/18/2013   CLINICAL DATA:  Followup edema.  EXAM: PORTABLE CHEST - 1 VIEW  COMPARISON:  08/17/2013 and 06/29/2013  FINDINGS: Lungs are adequately inflated with moderate interval improvement in the previously interstitial edema only minimal residual congestion. There is stable borderline cardiomegaly. Remainder the exam is unchanged.  IMPRESSION: Moderate interval improvement in patient's  previously noted interstitial edema with minimal residual vascular congestion.   Electronically Signed   By: Elberta Fortis M.D.   On: 08/18/2013 16:16   Dg Chest Portable 1 View  08/17/2013   CLINICAL DATA:  Abdominal pain, increase in blood pressure  EXAM: PORTABLE CHEST - 1 VIEW  COMPARISON:  06/29/2013  FINDINGS: Cardiomegaly again noted. Atherosclerotic calcifications of thoracic aorta. Slight worsening interstitial prominence bilaterally. Mild edema or pneumonitis cannot be excluded. No segmental infiltrate.  IMPRESSION: Slight worsening interstitial prominence bilaterally. Mild edema or pneumonitis cannot be excluded. No segmental infiltrate. Follow-up examination is recommended.   Electronically Signed   By: Natasha Mead M.D.   On: 08/17/2013 15:11     Telemetry: NSR Personally viewed.   Cardiac Studies:  Normal EF  . amLODipine  10 mg Oral QHS  . aspirin  81 mg Oral Daily  . atorvastatin  20 mg Oral q1800  . benazepril  40 mg Oral BID  . carvedilol  12.5 mg Oral BID WC  . cloNIDine  0.2 mg Oral BID  . doxercalciferol  1 mcg Intravenous Q M,W,F-HD  . heparin  5,000 Units Subcutaneous 3 times per day   Assessment/Plan:  Active Problems:   Hypertensive emergency   1) HTN emergency   - improved with HD  - continue with current meds.   2) Elevated troponin  - likely demand ischemia  - ASA, Bb,  ACE-I, BP control. Statin.  - Elevated troponin even in the setting of what appears to be demand ischemia in HTN crisis increases overall mortality.  - continue with aggressive medical mgt.   - will arrange for outpatient NUC stress.   3) ESRD  - missed HD which likely precipitated HTN.   OK with DC when OK by primary team.   Anne FuSKAINS, MARK 08/19/2013, 7:43 AM

## 2013-08-19 NOTE — Procedures (Signed)
Patient was seen on dialysis and the procedure was supervised.  BFR 400  Via AVF BP is  142/57.   Patient appears to be tolerating treatment well  Judy Wolf A 08/19/2013

## 2013-08-20 DIAGNOSIS — I10 Essential (primary) hypertension: Secondary | ICD-10-CM | POA: Diagnosis present

## 2013-08-20 DIAGNOSIS — J811 Chronic pulmonary edema: Secondary | ICD-10-CM | POA: Diagnosis present

## 2013-08-20 DIAGNOSIS — E43 Unspecified severe protein-calorie malnutrition: Secondary | ICD-10-CM | POA: Diagnosis present

## 2013-08-20 DIAGNOSIS — Z992 Dependence on renal dialysis: Secondary | ICD-10-CM

## 2013-08-20 DIAGNOSIS — E875 Hyperkalemia: Secondary | ICD-10-CM

## 2013-08-20 NOTE — Progress Notes (Signed)
[facility] Progress Note   Subjective: Confusion noted- now maybe looking at SNF for disposition- HD yest- removed 2600 no drop in BP- post weight 48.6 kg   Filed Vitals:   08/19/13 2110 08/20/13 0035 08/20/13 0441 08/20/13 0614  BP: 179/58 114/40 156/46   Pulse: 74  71   Temp: 98.6 F (37 C)  97.5 F (36.4 C)   TempSrc: Oral  Oral   Resp: 18  18   Height:      Weight:    46 kg (101 lb 6.6 oz)  SpO2: 100%  98%    Exam: Alert, elderly AAF, somewhat frail, no distress - more confused than yesterday No rash, cyanosis or gangrene  Sclera anicteric, throat clear  No jvd, ? L carotid bruit  Chest w bibasilar rales R>L  RRR 2/6 SEM no RG  Abd soft, mild mid abd tenderness, no ascites  1+ pitting edema pretib bilat  Neuro is nf, ox3  LUA AVF patent, soft   CXR  2/28 > mild-mod pulm edema CXR 3/1 > improved pulm edema, residual vasc congestion Last ECHO > LV 60%, EV normal   Dialysis: MWF  [facility]  4h F160 54kg 2K/2.25 [place] 15ga Prof 4 Heparin 1500 LUA AVF  Hectorol 1 Epo 1000 on Mon & Fri   Assessment:  1 Pulm edem / vol excess- improved 2 Hyperkalemia- resolved 3 ESRD on hemodialysis - MWF, due tomorrow via AVF- new EDW at least 48, possibly 47 4 Hx of TIA / CVA - diffuse dz of small-med sized vessels by MRA 2011  5 HTN urgency- on home meds  norvasc 10, lotensin 40 BID and clonidine 0.2 BID and coreg 12.5 BID- pretty  well controlled currently  6 Abd pain / N /V- unclear cause, per primary- seems better 7 Anemia Hb 12 but decreasing, resume esa  8 2HPT- cont phoslo/D  9 Mild dementia  10 Hx DVT/PE in 2011 11. Dispo- looking at SNF now      Judy Wolf A   08/20/2013, 10:11 AM     Recent Labs Lab 08/18/13 0650 08/19/13 0355 08/19/13 1048  NA 141 137 138  K 5.0 4.9 4.9  CL 96 94* 94*  CO2 27 29 25   GLUCOSE 86 94 110*  BUN 32* 57* 63*  CREATININE 5.55* 7.38* 8.07*  CALCIUM 9.6 9.3 9.2  PHOS  --  6.6* 6.1*    Recent Labs Lab  08/17/13 1525 08/19/13 1048  AST 39*  --   ALT 24  --   ALKPHOS 55  --   BILITOT 0.4  --   PROT 7.4  --   ALBUMIN 3.6 3.2*    Recent Labs Lab 08/17/13 1348  08/18/13 0650 08/19/13 0355 08/19/13 1047  WBC 8.9  --  7.3 6.8 8.4  NEUTROABS 6.9  --   --   --   --   HGB 12.8  < > 11.0* 9.9* 10.4*  HCT 38.9  < > 33.7* 30.0* 31.4*  MCV 91.7  --  93.1 92.6 91.5  PLT 375  --  311 305 321  < > = values in this interval not displayed. Marland Kitchen amLODipine  10 mg Oral QHS  . aspirin  81 mg Oral Daily  . atorvastatin  20 mg Oral q1800  . benazepril  40 mg Oral BID  . calcium acetate  1,334 mg Oral TID WC  . carvedilol  12.5 mg Oral BID WC  . cloNIDine  0.2 mg Oral BID  .  darbepoetin (ARANESP) injection - DIALYSIS  60 mcg Intravenous Q Mon-HD  . doxercalciferol  1 mcg Intravenous Q M,W,F-HD  . feeding supplement (NEPRO CARB STEADY)  237 mL Oral Q supper  . heparin  5,000 Units Subcutaneous 3 times per day     sodium chloride, acetaminophen, hydrALAZINE, HYDROcodone-acetaminophen, labetalol, nitroGLYCERIN, ondansetron (ZOFRAN) IV

## 2013-08-20 NOTE — Progress Notes (Signed)
Clinical Social Work Department BRIEF PSYCHOSOCIAL ASSESSMENT 08/20/2013  Patient:  Judy Wolf,Judy Wolf     Account Number:  [id number]     Admit date:  08/17/2013  Clinical Social Worker:  Carren RangPURITZ,Fidelia Cathers, LCSWA  Date/Time:  08/20/2013 11:44 AM  Referred by:  RN  Date Referred:  08/20/2013 Referred for  SNF Placement   Other Referral:   Interview type:  Family Other interview type:   Per RN, patient is confused and to call patient's daughter    PSYCHOSOCIAL DATA Living Status:  ALONE Admitted from facility:   Level of care:   Primary support name:  Mary Petine Primary support relationship to patient:  CHILD, ADULT Degree of support available:   Daughter appears to be supportive    CURRENT CONCERNS Current Concerns  Post-Acute Placement   Other Concerns:    SOCIAL WORK ASSESSMENT / PLAN Per RN, patient is confused and may need SNF placement, awaiting PT/OT evaluation. CSW went ahead and spoke to patient's daughter over the phone about SNF placement. Daughter states that patient has been [facility] one previously, [facility] and daughter was pleased with this SNF bc "patient got stronger and was able to go home." Patient's daughter states she would like patient to go there again at discharge if PT recommends that. CSW will continue to follow and update patients daughter.   Assessment/plan status:  Psychosocial Support/Ongoing Assessment of Needs Other assessment/ plan:   Information/referral to community resources:   SNF/ CSW info    PATIENT'S/FAMILY'S RESPONSE TO PLAN OF CARE: Patient's daughter states she would like her mom to [facility] so she can get stronger and be able to go back home. Patient's daughter states she is trying to do what is best for the patient.       Maree KrabbeLindsay Kenon Delashmit, MSW, Theresia MajorsLCSWA [phone]

## 2013-08-20 NOTE — Progress Notes (Signed)
CS[facility] and they are able to make a bed offer for patient when she is medically ready.   Maree KrabbeLindsay Chayce Rullo, MSW, Theresia MajorsLCSWA [phone]

## 2013-08-20 NOTE — Progress Notes (Signed)
[facility] PROGRESS NOTE  Judy FullerHarris M Guimond ONG:[id number] DOB: 01-Dec-1928 DOA: 08/17/2013 PCP: Ailene RavelHAMRICK,MAURA L, MD  Assessment/Plan: #1hypertensive emergency/accelerated hypertension Improved with hemodialysis and current medications.continue clonidine, Coreg, benazepril, Norvasc. Follow.  #2 pulmonary edema Likely secondary to end-stage renal disease. Improved with hemodialysis.patient is currently euvolemic. Follow.  #3 end-stage renal disease On hemodialysis Monday Wednesday Friday. Per nephrology.  #4 hyperlipidemia Continue Lipitor.  #5 elevated troponin Likely secondary to demand ischemia. Continue aspirin, Coreg, benazepril, Lipitor. Will need outpatient followup for nuclear stress test with cardiology.  #6 hypokalemia Resolved.   #7 history of TIA/CVA Stable. Continue aspirin for secondary stroke prevention.  #8 mild dementia  #9 abdominal pain/ nausea/ vomiting Resolved.  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    Code Status: full. Family Communication:updated patient at bedside no family present. Disposition Plan: SNF when medically stable   Consultants:  Nephrology: Dr Arta SilenceShertz 08/17/13  Cardiology: Dr End 08/17/13  Procedures:  CXR 08/18/13, 08/17/13    Antibiotics:  None  HPI/Subjective: Patient sleeping and easily arousable. Patient is c/o weakness.  Objective: Filed Vitals:   08/20/13 1414  BP: 124/43  Pulse: 65  Temp:   Resp:     Intake/Output Summary (Last 24 hours) at 08/20/13 1608 Last data filed at 08/20/13 1300  Gross per 24 hour  Intake    600 ml  Output      0 ml  Net    600 ml   Filed Weights   08/19/13 1040 08/19/13 1441 08/20/13 0614  Weight: 51 kg (112 lb 7 oz) 48.6 kg (107 lb 2.3 oz) 46 kg (101 lb 6.6 oz)    Exam:   General:  NAD  Cardiovascular: RRR  Respiratory: CTAB ANTERIOR LUNG FIELDS  Abdomen: Soft/NT/ND/+BS  Musculoskeletal: No c/c/e   Data Reviewed: Basic Metabolic Panel:  Recent Labs Lab  08/17/13 1409 08/17/13 1525 08/17/13 2239 08/18/13 0650 08/19/13 0355 08/19/13 1048  NA 136* 141  --  141 137 138  K 6.5* 5.8* 3.5* 5.0 4.9 4.9  CL 97 91*  --  96 94* 94*  CO2  --  28  --  27 29 25   GLUCOSE 125* 125*  --  86 94 110*  BUN 68* 48*  --  32* 57* 63*  CREATININE 7.50* 7.44*  --  5.55* 7.38* 8.07*  CALCIUM  --  10.2  --  9.6 9.3 9.2  MG  --   --   --   --  2.3  --   PHOS  --   --   --   --  6.6* 6.1*   Liver Function Tests:  Recent Labs Lab 08/17/13 1525 08/19/13 1048  AST 39*  --   ALT 24  --   ALKPHOS 55  --   BILITOT 0.4  --   PROT 7.4  --   ALBUMIN 3.6 3.2*    Recent Labs Lab 08/17/13 1525  LIPASE 33   No results found for this basename: AMMONIA,  in the last 168 hours CBC:  Recent Labs Lab 08/17/13 1348 08/17/13 1409 08/18/13 0650 08/19/13 0355 08/19/13 1047  WBC 8.9  --  7.3 6.8 8.4  NEUTROABS 6.9  --   --   --   --   HGB 12.8 14.3 11.0* 9.9* 10.4*  HCT 38.9 42.0 33.7* 30.0* 31.4*  MCV 91.7  --  93.1 92.6 91.5  PLT 375  --  311 305 321   Cardiac Enzymes:  Recent Labs Lab 08/17/13 1850 08/17/13 2239  08/18/13 0155 08/18/13 0650  CKTOTAL  --  92  --   --   TROPONINI 0.65*  --  0.51* 0.42*   BNP (last 3 results)  Recent Labs  08/17/13 1850  PROBNP >70000.0*   CBG: No results found for this basename: GLUCAP,  in the last 168 hours  Recent Results (from the past 240 hour(s))  MRSA PCR SCREENING     Status: None   Collection Time    08/17/13  7:38 PM      Result Value Ref Range Status   MRSA by PCR NEGATIVE  NEGATIVE Final   Comment:            The GeneXpert MRSA Assay (FDA     approved for NASAL specimens     only), is one component of a     comprehensive MRSA colonization     surveillance program. It is not     intended to diagnose MRSA     infection nor to guide or     monitor treatment for     MRSA infections.     Studies: No results found.  Scheduled Meds: . amLODipine  10 mg Oral QHS  . aspirin  81 mg  Oral Daily  . atorvastatin  20 mg Oral q1800  . benazepril  40 mg Oral BID  . calcium acetate  1,334 mg Oral TID WC  . carvedilol  12.5 mg Oral BID WC  . cloNIDine  0.2 mg Oral BID  . darbepoetin (ARANESP) injection - DIALYSIS  60 mcg Intravenous Q Mon-HD  . doxercalciferol  1 mcg Intravenous Q M,W,F-HD  . feeding supplement (NEPRO CARB STEADY)  237 mL Oral Q supper  . heparin  5,000 Units Subcutaneous 3 times per day   Continuous Infusions:   Principal Problem:   Hypertensive emergency Active Problems:   ESRD (end stage renal disease) on dialysis   Diabetes mellitus type 2, controlled   Protein-calorie malnutrition, severe   Accelerated hypertension   Pulmonary edema    Time spent: 40 MINS    [facility] MD [facility] Pager [phone]. If 7PM-7AM, please contact night-coverage at [email or web address], password [facility] 08/20/2013, 4:08 PM  LOS: 3 days

## 2013-08-20 NOTE — Progress Notes (Signed)
Subjective:  Confused last evening.    Objective:  Vital Signs in the last 24 hours: Temp:  [97.5 F (36.4 C)-98.6 F (37 C)] 97.5 F (36.4 C) (03/03 0441) Pulse Rate:  [60-98] 71 (03/03 0441) Resp:  [16-18] 18 (03/03 0441) BP: (114-183)/(40-92) 156/46 mmHg (03/03 0441) SpO2:  [97 %-100 %] 98 % (03/03 0441) Weight:  [101 lb 6.6 oz (46 kg)-112 lb 7 oz (51 kg)] 101 lb 6.6 oz (46 kg) (03/03 16100614)  Intake/Output from previous day: 03/02 0701 - 03/03 0700 In: 480 [P.O.:480] Out: 2600    Physical Exam: General: Well developed, elderly, in no acute distress. Head:  Normocephalic and atraumatic. Lungs: Clear to auscultation and percussion. Heart: Normal S1 and S2.  No murmur, rubs or gallops.  Abdomen: soft, non-tender, positive bowel sounds. Extremities: No clubbing or cyanosis. No edema. Neurologic: Alert and oriented x 3 now    Lab Results:  Recent Labs  08/19/13 0355 08/19/13 1047  WBC 6.8 8.4  HGB 9.9* 10.4*  PLT 305 321    Recent Labs  08/19/13 0355 08/19/13 1048  NA 137 138  K 4.9 4.9  CL 94* 94*  CO2 29 25  GLUCOSE 94 110*  BUN 57* 63*  CREATININE 7.38* 8.07*    Recent Labs  08/18/13 0155 08/18/13 0650  TROPONINI 0.51* 0.42*   Hepatic Function Panel  Recent Labs  08/17/13 1525 08/19/13 1048  PROT 7.4  --   ALBUMIN 3.6 3.2*  AST 39*  --   ALT 24  --   ALKPHOS 55  --   BILITOT 0.4  --   BILIDIR <0.2  --   IBILI NOT CALCULATED  --     Recent Labs  08/18/13 0650  CHOL 239*    Imaging: Dg Chest Port 1 View  08/18/2013   CLINICAL DATA:  Followup edema.  EXAM: PORTABLE CHEST - 1 VIEW  COMPARISON:  08/17/2013 and 06/29/2013  FINDINGS: Lungs are adequately inflated with moderate interval improvement in the previously interstitial edema only minimal residual congestion. There is stable borderline cardiomegaly. Remainder the exam is unchanged.  IMPRESSION: Moderate interval improvement in patient's previously noted interstitial edema with  minimal residual vascular congestion.   Electronically Signed   By: Elberta Fortisaniel  Boyle M.D.   On: 08/18/2013 16:16     Telemetry: NSR Personally viewed.   Cardiac Studies:  Normal EF  . amLODipine  10 mg Oral QHS  . aspirin  81 mg Oral Daily  . atorvastatin  20 mg Oral q1800  . benazepril  40 mg Oral BID  . calcium acetate  1,334 mg Oral TID WC  . carvedilol  12.5 mg Oral BID WC  . cloNIDine  0.2 mg Oral BID  . darbepoetin (ARANESP) injection - DIALYSIS  60 mcg Intravenous Q Mon-HD  . doxercalciferol  1 mcg Intravenous Q M,W,F-HD  . feeding supplement (NEPRO CARB STEADY)  237 mL Oral Q supper  . heparin  5,000 Units Subcutaneous 3 times per day   Assessment/Plan:  Active Problems:   Hypertensive emergency   Protein-calorie malnutrition, severe   1) Accelerated HTN   - improved with HD  - continue with current meds.   - had episode when she refused meds last night, stated that she was not in the hospital.   2) Elevated troponin (.65)  - likely demand ischemia  - ASA, Bb, ACE-I, BP control. Statin.  - Elevated troponin even in the setting of what appears to be demand ischemia  in HTN crisis increases overall mortality.  - continue with aggressive medical mgt.   - will arrange for outpatient NUC stress.   3) ESRD  - missed HD which likely precipitated HTN.   OK with DC when OK by primary team.   Anne Fu, MARK 08/20/2013, 9:43 AM

## 2013-08-20 NOTE — Progress Notes (Signed)
Clinical Social Work Department CLINICAL SOCIAL WORK PLACEMENT NOTE 08/20/2013  Patient:  [name],Judy M  Account Number:  [id number] Admit date:  08/17/2013  Clinical Social Worker:  Carren RangLINDSAY Nickola Lenig, LCSWA  Date/time:  08/20/2013 11:50 AM  Clinical Social Work is seeking post-discharge placement for this patient at the following level of care:   SKILLED NURSING   (*CSW will update this form in Epic as items are completed)   08/20/2013  Patient/family provided with [facility] Department of Clinical Social Work's list of facilities offering this level of care within the geographic area requested by the patient (or if unable, by the patient's family).  08/20/2013  Patient/family informed of their freedom to choose among providers that offer the needed level of care, that participate in Medicare, Medicaid or managed care program needed by the patient, have an available bed and are willing to accept the patient.  08/20/2013  Patient/family informed of [facility]' ownership interest in [facility], as well as of the fact that they are under no obligation to receive care at this facility.  PASARR submitted to EDS on  PASARR number received from EDS on   FL2 transmitted to all facilities in geographic area requested by pt/family on  08/20/2013 FL2 transmitted to all facilities within larger geographic area on   Patient informed that his/her managed care company has contracts with or will negotiate with  certain facilities, including the following:     Patient/family informed of bed offers received:   Patient chooses bed at  Physician recommends and patient chooses bed at    Patient to be transferred to  on   Patient to be transferred to facility by   The following physician request were entered in Epic:   Additional Comments: Patient had previous PASRR  Maree KrabbeLindsay Tamarion Haymond, MSW, [place]strand[place]CSWA [phone]

## 2013-08-20 NOTE — Care Management Note (Unsigned)
    Page 1 of 1   08/20/2013     3:14:42 PM   CARE MANAGEMENT NOTE 08/20/2013  Patient:  Judy Wolf,Lake M   Account Number:  [id number]  Date Initiated:  08/20/2013  Documentation initiated by:  Shemeika Starzyk  Subjective/Objective Assessment:   PT ADM ON 2/28 WITH HYPERTENSIVE EMERGENCY AND ELEV TROPONINS.  PTA, PT RESIDED AT HOME ALONE.  PER DAUGHTER, SHE HAS RECENTLY BEEN AT SNF.     Action/Plan:   PT CONFUSED; PT/OT EVALUATIONS PENDING.  WILL LIKELY NEED SNF AT DC.  CSW CONSULTED TO FACILITATE POSS DC TO SNF WHEN MEDICALLY STABLE FOR DC.   Anticipated DC Date:  08/22/2013   Anticipated DC Plan:  SKILLED NURSING FACILITY  In-house referral  Clinical Social Worker      DC Planning Services  CM consult      Choice offered to / List presented to:             Status of service:  In process, will continue to follow Medicare Important Message given?   (If response is "NO", the following Medicare IM given date fields will be blank) Date Medicare IM given:   Date Additional Medicare IM given:    Discharge Disposition:    Per UR Regulation:  Reviewed for med. necessity/level of care/duration of stay  If discussed at Long Length of Stay Meetings, dates discussed:    Comments:

## 2013-08-20 NOTE — Progress Notes (Signed)
Patients daughter called back this a.m to get an update on the patient. The daughter stated that with her moms worsening confusion, she does not believe she is safe for her to go back home alone. Daughter stated that patient was recently discharged from the hospital and sent to [facility] in [place], was there for 2 weeks. She left on Feb. 12th. Daughter stated that the report that the patient lives with her husband is not true, and that her husband died over 10 years ago. Patient very weak this a.m ambulating to bathroom. Would really benefit from PT. Will contact attending MD for plan of care and plan for discharge. Judy KidneyCURRY, Judy Wolf

## 2013-08-21 LAB — RENAL FUNCTION PANEL
Albumin: 3.3 g/dL — ABNORMAL LOW (ref 3.5–5.2)
BUN: 54 mg/dL — ABNORMAL HIGH (ref 6–23)
CALCIUM: 10 mg/dL (ref 8.4–10.5)
CO2: 24 meq/L (ref 19–32)
CREATININE: 6.87 mg/dL — AB (ref 0.50–1.10)
Chloride: 91 mEq/L — ABNORMAL LOW (ref 96–112)
GFR calc Af Amer: 6 mL/min — ABNORMAL LOW (ref >90)
GFR calc non Af Amer: 5 mL/min — ABNORMAL LOW (ref >90)
Glucose, Bld: 116 mg/dL — ABNORMAL HIGH (ref 70–99)
Phosphorus: 5.2 mg/dL — ABNORMAL HIGH (ref 2.3–4.6)
Potassium: 4.4 mEq/L (ref 3.7–5.3)
Sodium: 136 mEq/L — ABNORMAL LOW (ref 137–147)

## 2013-08-21 LAB — CBC
HCT: 34.2 % — ABNORMAL LOW (ref 36.0–46.0)
Hemoglobin: 11.3 g/dL — ABNORMAL LOW (ref 12.0–15.0)
MCH: 30.2 pg (ref 26.0–34.0)
MCHC: 33 g/dL (ref 30.0–36.0)
MCV: 91.4 fL (ref 78.0–100.0)
Platelets: 368 10*3/uL (ref 150–400)
RBC: 3.74 MIL/uL — AB (ref 3.87–5.11)
RDW: 14.5 % (ref 11.5–15.5)
WBC: 9.1 10*3/uL (ref 4.0–10.5)

## 2013-08-21 MED ORDER — BENAZEPRIL HCL 40 MG PO TABS: 40.0000 mg | ORAL_TABLET | Freq: Every day | ORAL | Status: AC

## 2013-08-21 MED ORDER — HYDROCODONE-ACETAMINOPHEN 5-325 MG PO TABS: 0.5000 | ORAL_TABLET | Freq: Two times a day (BID) | ORAL | Status: DC | PRN

## 2013-08-21 MED ORDER — NITROGLYCERIN 0.4 MG SL SUBL: 0.4000 mg | SUBLINGUAL_TABLET | SUBLINGUAL | Status: AC | PRN

## 2013-08-21 MED ORDER — LORAZEPAM 2 MG/ML IJ SOLN
0.5000 mg | Freq: Once | INTRAMUSCULAR | Status: AC
  Administered 2013-08-21: 0.5 mg via INTRAVENOUS

## 2013-08-21 MED ORDER — NEPRO/CARBSTEADY PO LIQD: 237.0000 mL | Freq: Every day | ORAL | Status: AC

## 2013-08-21 MED ORDER — LORAZEPAM 2 MG/ML IJ SOLN
INTRAMUSCULAR | Status: AC
  Filled 2013-08-21: qty 1

## 2013-08-21 MED ORDER — BENAZEPRIL HCL 40 MG PO TABS
40.0000 mg | ORAL_TABLET | Freq: Every day | ORAL | Status: DC
  Administered 2013-08-21: 40 mg via ORAL
  Filled 2013-08-21: qty 1

## 2013-08-21 MED ORDER — ACETAMINOPHEN 500 MG PO TABS: 1000.0000 mg | ORAL_TABLET | Freq: Two times a day (BID) | ORAL | Status: AC | PRN

## 2013-08-21 MED ORDER — CALCIUM ACETATE 667 MG PO CAPS: 1334.0000 mg | ORAL_CAPSULE | Freq: Three times a day (TID) | ORAL | Status: AC

## 2013-08-21 MED ORDER — ATORVASTATIN CALCIUM 20 MG PO TABS: 20.0000 mg | ORAL_TABLET | Freq: Every day | ORAL | Status: AC

## 2013-08-21 MED ORDER — HEPARIN SODIUM (PORCINE) 1000 UNIT/ML DIALYSIS: 20.0000 [IU]/kg | INTRAMUSCULAR | Status: DC | PRN

## 2013-08-21 MED ORDER — AMLODIPINE BESYLATE 10 MG PO TABS: 10.0000 mg | ORAL_TABLET | Freq: Every day | ORAL | Status: AC

## 2013-08-21 MED ORDER — DOXERCALCIFEROL 4 MCG/2ML IV SOLN
INTRAVENOUS | Status: AC
  Filled 2013-08-21: qty 2

## 2013-08-21 MED ORDER — CARVEDILOL 12.5 MG PO TABS
12.5000 mg | ORAL_TABLET | Freq: Two times a day (BID) | ORAL | Status: AC
Start: 2013-08-21 — End: ?

## 2013-08-21 MED ORDER — ASPIRIN EC 81 MG PO TBEC: 81.0000 mg | DELAYED_RELEASE_TABLET | Freq: Every day | ORAL | Status: AC

## 2013-08-21 NOTE — Discharge Summary (Signed)
Physician Discharge Summary  Judy FullerHarris M Wolf WUJ:[id number] DOB: 1929/06/05 DOA: 08/17/2013  PCP: Ailene RavelHAMRICK,MAURA L, MD  Admit date: 08/17/2013 Discharge date: 08/21/2013  Time spent: Greater than 30 minutes  Recommendations for Outpatient Follow-up:  1. MD at Skilled Nursing Facility in 3 days. 2. Dr. Donato SchultzMark Skains, Cardiology: MD's office will arrange for OP stress test and follow up. 3. Hemodialysis center: Keep up regular hemodialysis appointments on Mondays, Wednesdays and Fridays. 4. Dr. Burnell BlanksMaura Hamrick, PCP  Discharge Diagnoses:  Principal Problem:   Hypertensive emergency Active Problems:   ESRD (end stage renal disease) on dialysis   Diabetes mellitus type 2, controlled   Protein-calorie malnutrition, severe   Accelerated hypertension   Pulmonary edema   Discharge Condition: Improved & Stable  Diet recommendation: Heart healthy diet  Filed Weights   08/21/13 0508 08/21/13 0650 08/21/13 0944  Weight: 47.5 kg (104 lb 11.5 oz) 48 kg (105 lb 13.1 oz) 46.8 kg (103 lb 2.8 oz)    History of present illness:  80 year old female patient with history of ESRD on MWF hemodialysis, prior CVA and multiple TIAs, prior DVT/PE, hypertension, was admitted to the hospital by the critical care service on 08/17/13 for complaints of nausea, vomiting, substernal chest pain, hypertensive emergency and NSTEMI  Hospital Course:   1. Hypertensive emergency: Probably in the context of missing hemodialysis and questionable compliance with antihypertensives. Patient was admitted to the intensive care unit. She was started on IV nitroglycerin drip. Nephrology was consulted and patient has been undergoing inpatient hemodialysis. Her home antihypertensives were resumed and titrated. Her blood pressures are much better controlled and will need close monitoring and medication adjustment as outpatient. 2. Chest pain/NSTEMI/CAD: Cardiology consulted. Attributed to demand ischemia in the setting of hypertensive  emergency and pulmonary edema. Patient's chest pain seemed to resolve after hemodialysis associated improvement in pulmonary edema and with blood pressure control. Patient denies any chest pain or dyspnea at this time. Discussed with cardiology today who will arrange for outpatient nuclear stress test and follow. Continue aspirin, carvedilol, statins. As per cardiology, patient denies prior history of cardiac disease, she underwent a myocardial perfusion stress test in 10/2009 that did not reveal any ischemia or scar. Her mobility is quite limited and she is unable to perform more than 4 METs of activity. Other causes for chest pain were considered including PE and aortic dissection, however were felt less likely. 3. ESRD on MWF hemodialysis/hyperkalemia: Nephrology consulted and patient continued to have inpatient dialysis-last time was today. Discussed with nephrology who have cleared her for discharge and will arrange for outpatient hemodialysis. Hyperkalemia resolved. New estimated dry weight at least 48, possibly 47 as per nephrology. 4. Abdominal pain, Nausea and vomiting: Unclear etiology. Seems to have resolved. 5. Acute pulmonary edema/volume excess: Secondary to hypertensive emergency and ESRD missing dialysis.. Treated with hemodialysis and has improved. 6. History of TIA/CVA: Diffuse disease of small-medium-sized vessels by MRA 2011. 7. Anemia: Stable. 8. Secondary hyperparathyroidism: Continue PhosLo. 9. Dementia with intermittent agitation: Likely secondary to decompensating dementia, hospitalization and acute illness. Has not required a significant amount of anti-agitation medications. As per social workers discussion with patient's daughter, patient had been at the skilled nursing facility in the past, got better/stronger and was able to go home 10. Severe malnutrition in the context of chronic illness: Management per dietitian consultation. 11. History of DVT/PE  2011.  Consultations:  Critical care medicine  Nephrology  Cardiology  Procedures:  Hemodialysis    Discharge Exam:  Complaints:  Patient is  pleasantly confused but oriented to self and place. Denies complaints. Denies dyspnea or chest pain. She was seen across dialysis this morning.  Filed Vitals:   08/21/13 0900 08/21/13 0930 08/21/13 0944 08/21/13 1026  BP: 150/56 132/52 154/61 127/40  Pulse: 60 63 67 69  Temp:   97.2 F (36.2 C) 98.7 F (37.1 C)  TempSrc:   Oral Oral  Resp: 12 20 20 18   Height:      Weight:   46.8 kg (103 lb 2.8 oz)   SpO2:   100% 98%    General exam: Pleasant elderly female lying comfortably in bed undergoing hemodialysis. Respiratory system: Clear. No increased work of breathing. Cardiovascular system: S1 & S2 heard, RRR. No JVD, murmurs, gallops, clicks or pedal edema. Gastrointestinal system: Abdomen is nondistended, soft and nontender. Normal bowel sounds heard. Central nervous system: Alert and oriented to self and place. No focal neurological deficits. Extremities: Symmetric 5 x 5 power. Left forearm AV fistula.  Discharge Instructions      Discharge Orders   Future Orders Complete By Expires   (HEART FAILURE PATIENTS) Call MD:  Anytime you have any of the following symptoms: 1) 3 pound weight gain in 24 hours or 5 pounds in 1 week 2) shortness of breath, with or without a dry hacking cough 3) swelling in the hands, feet or stomach 4) if you have to sleep on extra pillows at night in order to breathe.  As directed    Call MD for:  difficulty breathing, headache or visual disturbances  As directed    Call MD for:  persistant nausea and vomiting  As directed    Call MD for:  redness, tenderness, or signs of infection (pain, swelling, redness, odor or green/yellow discharge around incision site)  As directed    Call MD for:  severe uncontrolled pain  As directed    Diet - low sodium heart healthy  As directed    Increase activity slowly   As directed        Medication List    STOP taking these medications       HYDROcodone-acetaminophen 5-325 MG per tablet  Commonly known as:  NORCO/VICODIN      TAKE these medications       acetaminophen 500 MG tablet  Commonly known as:  TYLENOL  Take 2 tablets (1,000 mg total) by mouth 2 (two) times daily as needed for mild pain.     amLODipine 10 MG tablet  Commonly known as:  NORVASC  Take 1 tablet (10 mg total) by mouth at bedtime.     aspirin EC 81 MG tablet  Take 1 tablet (81 mg total) by mouth daily.     atorvastatin 20 MG tablet  Commonly known as:  LIPITOR  Take 1 tablet (20 mg total) by mouth daily at 6 PM.     benazepril 40 MG tablet  Commonly known as:  LOTENSIN  Take 1 tablet (40 mg total) by mouth daily.     calcium acetate 667 MG capsule  Commonly known as:  PHOSLO  Take 2 capsules (1,334 mg total) by mouth 3 (three) times daily with meals.     carvedilol 12.5 MG tablet  Commonly known as:  COREG  Take 1 tablet (12.5 mg total) by mouth 2 (two) times daily with a meal.     cloNIDine 0.1 MG tablet  Commonly known as:  CATAPRES  Take 0.2 mg by mouth 2 (two) times daily.  feeding supplement (NEPRO CARB STEADY) Liqd  Take 237 mLs by mouth daily with supper.     multivitamin Tabs tablet  Take 1 tablet by mouth at bedtime.     nitroGLYCERIN 0.4 MG SL tablet  Commonly known as:  NITROSTAT  Place 1 tablet (0.4 mg total) under the tongue every 5 (five) minutes as needed for chest pain (q 5 minutes x 3 doses).       Follow-up Information   Schedule an appointment as soon as possible for a visit with Ailene Ravel, MD.   Specialty:  Family Medicine   Contact information:   Dr. Burnell Blanks [address] [place] [address] [phone]       Follow up with MD at Skilled Nursing Facility. Schedule an appointment as soon as possible for a visit in 3 days.      [facility]. (Keep regular hemodialysis  appointments on Mondays, Wednesdays and Fridays.)       Follow up with Donato Schultz, MD. (Cardiologist's office will arrange for outpatient stress test & followup.)    Specialty:  Cardiology   Contact information:   [address] [place] [address] [phone]        The results of significant diagnostics from this hospitalization (including imaging, microbiology, ancillary and laboratory) are listed below for reference.    Significant Diagnostic Studies: Dg Chest Port 1 View  08/18/2013   CLINICAL DATA:  Followup edema.  EXAM: PORTABLE CHEST - 1 VIEW  COMPARISON:  08/17/2013 and 06/29/2013  FINDINGS: Lungs are adequately inflated with moderate interval improvement in the previously interstitial edema only minimal residual congestion. There is stable borderline cardiomegaly. Remainder the exam is unchanged.  IMPRESSION: Moderate interval improvement in patient's previously noted interstitial edema with minimal residual vascular congestion.   Electronically Signed   By: Elberta Fortis M.D.   On: 08/18/2013 16:16   Dg Chest Portable 1 View  08/17/2013   CLINICAL DATA:  Abdominal pain, increase in blood pressure  EXAM: PORTABLE CHEST - 1 VIEW  COMPARISON:  06/29/2013  FINDINGS: Cardiomegaly again noted. Atherosclerotic calcifications of thoracic aorta. Slight worsening interstitial prominence bilaterally. Mild edema or pneumonitis cannot be excluded. No segmental infiltrate.  IMPRESSION: Slight worsening interstitial prominence bilaterally. Mild edema or pneumonitis cannot be excluded. No segmental infiltrate. Follow-up examination is recommended.   Electronically Signed   By: Natasha Mead M.D.   On: 08/17/2013 15:11    Microbiology: Recent Results (from the past 240 hour(s))  MRSA PCR SCREENING     Status: None   Collection Time    08/17/13  7:38 PM      Result Value Ref Range Status   MRSA by PCR NEGATIVE  NEGATIVE Final   Comment:            The GeneXpert MRSA Assay  (FDA     approved for NASAL specimens     only), is one component of a     comprehensive MRSA colonization     surveillance program. It is not     intended to diagnose MRSA     infection nor to guide or     monitor treatment for     MRSA infections.     Labs: Basic Metabolic Panel:  Recent Labs Lab 08/17/13 1525 08/17/13 2239 08/18/13 0650 08/19/13 0355 08/19/13 1048 08/21/13 0709  NA 141  --  141 137 138 136*  K 5.8* 3.5* 5.0 4.9 4.9 4.4  CL  91*  --  96 94* 94* 91*  CO2 28  --  27 29 25 24   GLUCOSE 125*  --  86 94 110* 116*  BUN 48*  --  32* 57* 63* 54*  CREATININE 7.44*  --  5.55* 7.38* 8.07* 6.87*  CALCIUM 10.2  --  9.6 9.3 9.2 10.0  MG  --   --   --  2.3  --   --   PHOS  --   --   --  6.6* 6.1* 5.2*   Liver Function Tests:  Recent Labs Lab 08/17/13 1525 08/19/13 1048 08/21/13 0709  AST 39*  --   --   ALT 24  --   --   ALKPHOS 55  --   --   BILITOT 0.4  --   --   PROT 7.4  --   --   ALBUMIN 3.6 3.2* 3.3*    Recent Labs Lab 08/17/13 1525  LIPASE 33   No results found for this basename: AMMONIA,  in the last 168 hours CBC:  Recent Labs Lab 08/17/13 1348 08/17/13 1409 08/18/13 0650 08/19/13 0355 08/19/13 1047 08/21/13 0709  WBC 8.9  --  7.3 6.8 8.4 9.1  NEUTROABS 6.9  --   --   --   --   --   HGB 12.8 14.3 11.0* 9.9* 10.4* 11.3*  HCT 38.9 42.0 33.7* 30.0* 31.4* 34.2*  MCV 91.7  --  93.1 92.6 91.5 91.4  PLT 375  --  311 305 321 368   Cardiac Enzymes:  Recent Labs Lab 08/17/13 1850 08/17/13 2239 08/18/13 0155 08/18/13 0650  CKTOTAL  --  92  --   --   TROPONINI 0.65*  --  0.51* 0.42*   BNP: BNP (last 3 results)  Recent Labs  08/17/13 1850  PROBNP >70000.0*   CBG: No results found for this basename: GLUCAP,  in the last 168 hours  Additional labs: 1. Fasting lipids: Cholesterol 239, triglycerides 70, HDL 82, LDL 143 and VLDL 14 2. Hemoglobin A1c: 5.5 3. TSH: 2.894 4. Hepatitis B surface antigen:  Negative   Signed:  HONGALGI,ANAND, MD, FACP, FHM. [facility] Pager [phone]  If 7PM-7AM, please contact night-coverage [email or web address] Password [facility] 08/21/2013, 12:37 PM

## 2013-08-21 NOTE — Progress Notes (Signed)
Clinical Social Worker facilitated patient discharge by [facility] [facility] Patient's daughter agreeable to this plan and arranging transport via EMS . CSW will sign off, as social work intervention is no longer needed.  Maree KrabbeLindsay Alyzae Hawkey, MSW, Theresia MajorsLCSWA [phone]

## 2013-08-21 NOTE — Progress Notes (Addendum)
CSW attempted to explain to patient that she may be discharged today to a rehab facility. Patient appeared confused and asked social worker, "where is your mom at." CSW explained that MD recommendeds a short term rehab and patient has been to one before. Patient appears confused, but states she will "do whatever and go." CSW spoke with daughter over the phone to inform of possible dc to snf,. [facility] "as long as PT and MD think that is best for patient.   Maree KrabbeLindsay Florentina Marquart, MSW, Theresia MajorsLCSWA [phone]

## 2013-08-21 NOTE — Evaluation (Signed)
Occupational Therapy Evaluation Patient Details Name: Judy Wolf MRN: [id number] DOB: Aug 12, 1928 Today's Date: 08/21/2013 Time: 1194-1740 OT Time Calculation (min): 20 min  OT Assessment / Plan / Recommendation History of present illness  80 y.o ESRD on HD X 6y (M/W/F) admitted with nausea, vomiting, sub sternal chest pain & hypertensive emergency with nstemi. Started on IV NTG & taken to emergent HD for hyperkalemia, BP 241/95 on arrival   Clinical Impression   Pt present with confusion and dependence in all ADL.  She requires +2 assist for OOB.  Plan is for pt to go to SNF for further rehab.  Will defer OT to SNF.    OT Assessment  All further OT needs can be met in the next venue of care    Follow Up Recommendations  SNF;Supervision/Assistance - 24 hour    Barriers to Discharge      Equipment Recommendations       Recommendations for Other Services    Frequency       Precautions / Restrictions Precautions Precautions: Fall Restrictions Weight Bearing Restrictions: No   Pertinent Vitals/Pain No pain, VSS    ADL  Eating/Feeding: Minimal assistance Where Assessed - Eating/Feeding: Chair Grooming: Wash/dry hands;Wash/dry face;Minimal assistance Where Assessed - Grooming: Supported sitting Upper Body Bathing: Moderate assistance Where Assessed - Upper Body Bathing: Unsupported sitting Lower Body Bathing: Maximal assistance Where Assessed - Lower Body Bathing: Unsupported sitting;Supported sit to stand Upper Body Dressing: Minimal assistance Where Assessed - Upper Body Dressing: Unsupported sitting Lower Body Dressing: Maximal assistance Where Assessed - Lower Body Dressing: Unsupported sitting;Supported sit to stand    OT Diagnosis: Cognitive deficits;Generalized weakness  OT Problem List: Decreased strength;Decreased activity tolerance;Impaired balance (sitting and/or standing);Decreased cognition;Decreased safety awareness;Decreased knowledge of use of DME or AE OT  Treatment Interventions:     OT Goals(Current goals can be found in the care plan section)    Visit Information  Last OT Received On: 08/21/13 Assistance Needed: +2 History of Present Illness:  80 y.o ESRD on HD X 6y (M/W/F) admitted with nausea, vomiting, sub sternal chest pain & hypertensive emergency with nstemi. Started on IV NTG & taken to emergent HD for hyperkalemia, BP 241/95 on arrival       Prior [place] expects to be discharged to:: [place] - single point;Shower seat;Toilet riser Prior Function Level of Independence: Needs assistance ADL's / Homemaking Assistance Needed:  (pt states has needed some assist for ADL's lately) Comments: Amb with straight cane. Communication Communication: No difficulties Dominant Hand: Right         Vision/Perception     Cognition  Cognition Arousal/Alertness: Awake/alert Behavior During Therapy: WFL for tasks assessed/performed Overall Cognitive Status: No family/caregiver present to determine baseline cognitive functioning (pt with confusion about place, situation, people) Focused attention.   Extremity/Trunk Assessment Upper Extremity Assessment Upper Extremity Assessment: Generalized weakness Lower Extremity Assessment Lower Extremity Assessment: Defer to PT evaluation     Mobility Bed Mobility Overal bed mobility: Needs Assistance Bed Mobility: Supine to Sit;Sit to Supine Supine to sit: Mod assist Sit to supine: Mod assist General bed mobility comments: cues to attend to task and to right trunk Transfers Overall transfer level: Needs assistance Equipment used: Rolling walker (2 wheeled) Transfers: Sit to/from Stand Sit to Stand: Mod assist;+2 safety/equipment General transfer comment: lifting assist and stability assist     Exercise     Balance Balance Overall balance assessment:  Needs  assistance Sitting-balance support: Bilateral upper extremity supported;Feet supported Sitting balance-[name] Scale: Fair Standing balance support: Bilateral upper extremity supported Standing balance-[name] Scale: Poor   End of Session OT - End of Session Equipment Utilized During Treatment: Gait belt Activity Tolerance: Patient limited by fatigue Patient left: in bed;with call bell/phone within reach;with nursing/sitter in room  GO     Malka So 08/21/2013, 2:21 PM [phone]

## 2013-08-21 NOTE — Progress Notes (Signed)
Clinical Social Work Department CLINICAL SOCIAL WORK PLACEMENT NOTE 08/21/2013  Patient:  Judy Wolf,Judy Wolf  Account Number:  [id number] Admit date:  08/17/2013  Clinical Social Worker:  Carren RangLINDSAY Nayana Lenig, LCSWA  Date/time:  08/20/2013 11:50 AM  Clinical Social Work is seeking post-discharge placement for this patient at the following level of care:   SKILLED NURSING   (*CSW will update this form in Epic as items are completed)   08/20/2013  [facility]/family provided with [facility] of Clinical Social Work's list of facilities offering this level of care within the geographic area requested by the patient (or if unable, by the patient's family).  08/20/2013  Patient/family informed of their freedom to choose among providers that offer the needed level of care, that participate in Medicare, Medicaid or managed care program needed by the patient, have an available bed and are willing to accept the patient.  08/20/2013  Patient/family informed of [facility]' ownership interest in [facility], as well as of the fact that they are under no obligation to receive care at this facility.  PASARR submitted to EDS on  PASARR number received from EDS on   FL2 transmitted to all facilities in geographic area requested by pt/family on  08/20/2013 FL2 transmitted to all facilities within larger geographic area on   Patient informed that his/her managed care company has contracts with or will negotiate with  certain facilities, including the following:     Patient/family informed of bed offers received:  08/21/2013 [facility] Physician recommends and patient chooses bed at    [facility] on  08/21/2013 Patient to be transferred to facility by EMS  The following physician request were entered in Epic:   Additional Comments: Patient had previous PASRR  Maree KrabbeLindsay Delorean Knutzen, MSW, KeatsLCSWA [phone]

## 2013-08-21 NOTE — Progress Notes (Signed)
[facility] Progress Note   Subjective: Seen in HD, seems to be cramping- uncomfortable, getting agitated   Filed Vitals:   08/21/13 0730 08/21/13 0800 08/21/13 0830 08/21/13 0900  BP: 123/57 92/43 129/55 150/56  Pulse: 64 61 64 60  Temp:      TempSrc:      Resp: 10 14 16 12   Height:      Weight:      SpO2:       Exam: Alert, elderly AAF, somewhat frail, no distress - more confused than yesterday No rash, cyanosis or gangrene  Sclera anicteric, throat clear  No jvd, ? L carotid bruit  Chest w bibasilar rales R>L  RRR 2/6 SEM no RG  Abd soft, mild mid abd tenderness, no ascites  Ne edema Neuro is nf, ox3  LUA AVF patent   CXR  2/28 > mild-mod pulm edema CXR 3/1 > improved pulm edema, residual vasc congestion Last ECHO > LV 60%, EV normal   Dialysis: MWF [place]  4h F160 54kg 2K/2.25 [place] 15ga Prof 4 Heparin 1500 LUA AVF  Hectorol 1 Epo 1000 on Mon & Fri   Assessment:  1 Pulm edem / vol excess- improved- have decreased her EDW  2 Hyperkalemia- resolved 3 ESRD on hemodialysis - MWF, due today via AVF- new EDW at least 48, possibly 47- is getting agitated, may be part of issue at kidney center why she cant get down to her EDW 4 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 Dispo- looking at SNF now - is OK for discharge from our standpoint- [facility]      Shauntavia Brackin A   08/21/2013, 9:20 AM     Recent Labs Lab 08/19/13 0355 08/19/13 1048 08/21/13 0709  NA 137 138 136*  K 4.9 4.9 4.4  CL 94* 94* 91*   CO2 29 25 24   GLUCOSE 94 110* 116*  BUN 57* 63* 54*  CREATININE 7.38* 8.07* 6.87*  CALCIUM 9.3 9.2 10.0  PHOS 6.6* 6.1* 5.2*    Recent Labs Lab 08/17/13 1525 08/19/13 1048 08/21/13 0709  AST 39*  --   --   ALT 24  --   --   ALKPHOS 55  --   --   BILITOT 0.4  --   --   PROT 7.4  --   --   ALBUMIN 3.6 3.2* 3.3*    Recent Labs Lab 08/17/13 1348  08/19/13 0355 08/19/13 1047 08/21/13 0709  WBC 8.9  < > 6.8 8.4 9.1  NEUTROABS 6.9  --   --   --   --   HGB 12.8  < > 9.9* 10.4* 11.3*  HCT 38.9  < > 30.0* 31.4* 34.2*  MCV 91.7  < > 92.6 91.5 91.4  PLT 375  < > 305 321 368  < > = values in this interval not displayed. Marland Kitchen amLODipine  10 mg Oral QHS  . aspirin  81 mg Oral Daily  .  atorvastatin  20 mg Oral q1800  . benazepril  40 mg Oral BID  . calcium acetate  1,334 mg Oral TID WC  . carvedilol  12.5 mg Oral BID WC  . cloNIDine  0.2 mg Oral BID  . darbepoetin (ARANESP) injection - DIALYSIS  60 mcg Intravenous Q Mon-HD  . doxercalciferol  1 mcg Intravenous Q M,W,F-HD  . feeding supplement (NEPRO CARB STEADY)  237 mL Oral Q supper  . heparin  5,000 Units Subcutaneous 3 times per day     sodium chloride, acetaminophen, heparin, hydrALAZINE, HYDROcodone-acetaminophen, labetalol, nitroGLYCERIN, ondansetron (ZOFRAN) IV

## 2013-08-21 NOTE — Procedures (Signed)
Patient was seen on dialysis and the procedure was supervised.  BFR 400  Via AVF BP is  150/56.   Patient appears to be getting agitated- will try some ativan-   Judy Wolf A 08/21/2013

## 2013-08-21 NOTE — Evaluation (Signed)
Physical Therapy Evaluation Patient Details Name: Judy Wolf MRN: [id number] DOB: 01-11-1929 Today's Date: 08/21/2013 Time: 0539-7673 PT Time Calculation (min): 20 min  PT Assessment / Plan / Recommendation History of Present Illness   80 y.o ESRD on HD X 6y (M/W/F) admitted with nausea, vomiting, sub sternal chest pain & hypertensive emergency with nstemi. Started on IV NTG & taken to emergent HD for hyperkalemia, BP 241/95 on arrival  Clinical Impression  Pt admitted with s/s above.  Pt currently limited functionally due to the problems listed. ( See problems list.)   Pt will benefit from PT to maximize function and safety in order to get ready for next venue listed below.     PT Assessment  All further PT needs can be met in the next venue of care    Follow Up Recommendations  SNF    Does the patient have the potential to tolerate intense rehabilitation      Barriers to Discharge        Equipment Recommendations  None recommended by PT    Recommendations for Other Services     Frequency      Precautions / Restrictions Precautions Precautions: Fall Restrictions Weight Bearing Restrictions: No   Pertinent Vitals/Pain       Mobility  Bed Mobility Overal bed mobility: Needs Assistance Bed Mobility: Supine to Sit;Sit to Supine Supine to sit: Mod assist Sit to supine: Mod assist General bed mobility comments: cues for guiding, staying on task and truncal assist Transfers Overall transfer level: Needs assistance Equipment used: Rolling walker (2 wheeled) Transfers: Sit to/from Stand Sit to Stand: Mod assist;+2 safety/equipment General transfer comment: lifting assist and stability assist Ambulation/Gait Ambulation/Gait assistance: Mod assist;+2 physical assistance Ambulation Distance (Feet): 35 Feet Assistive device: Rolling walker (2 wheeled) Gait Pattern/deviations: Step-through pattern;Scissoring;Drifts right/left;Wide base of support General Gait Details:  unsteady gait with lots of scissoring to try to maintain balance.  Trunk flexed and needing significant truncal assist as she fatigued.    Exercises     PT Diagnosis: Difficulty walking;Generalized weakness  PT Problem List: Decreased strength;Decreased activity tolerance;Decreased balance;Decreased mobility;Decreased cognition;Decreased knowledge of use of DME;Decreased knowledge of precautions PT Treatment Interventions:       PT Goals(Current goals can be found in the care plan section) Acute Rehab PT Goals PT Goal Formulation: No goals set, d/c therapy  Visit Information  Last PT Received On: 08/21/13 Assistance Needed: +2 (safety and assist) History of Present Illness:  80 y.o ESRD on HD X 6y (M/W/F) admitted with nausea, vomiting, sub sternal chest pain & hypertensive emergency with nstemi. Started on IV NTG & taken to emergent HD for hyperkalemia, BP 241/95 on arrival       Prior [place] expects to be discharged to:: [place] - single point;Shower seat;Toilet riser Prior Function Level of Independence: Needs assistance ADL's / Homemaking Assistance Needed:  (pt states has needed some assist for ADL's lately) Comments: Amb with straight cane. Communication Communication: No difficulties    Cognition  Cognition Arousal/Alertness: Lethargic Behavior During Therapy: WFL for tasks assessed/performed Overall Cognitive Status: Difficult to assess    Extremity/Trunk Assessment Upper Extremity Assessment Upper Extremity Assessment: Generalized weakness Lower Extremity Assessment Lower Extremity Assessment: Generalized weakness   Balance Balance Overall balance assessment: Needs assistance Sitting-balance support: Bilateral upper extremity supported;Feet supported Sitting balance-[name] Scale: Fair Standing balance support: Bilateral upper extremity supported Standing balance-[name] Scale: Poor  End of  Session PT - End  of Session Activity Tolerance: Patient limited by fatigue;Patient limited by lethargy Patient left: in bed;with call bell/phone within reach Nurse Communication: Mobility status  GP     Judy Wolf, Judy Wolf 08/21/2013, 1:46 PM 08/21/2013  Donnella Wolf, [place] [phone]  (pager)

## 2013-09-18 DEATH — deceased
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
